# Patient Record
Sex: Female | Born: 1985 | Race: White | Hispanic: No | Marital: Married | Smoking: Former smoker
Health system: Southern US, Community
[De-identification: ages and names within clinical notes are randomized; demographics above are authoritative.]

## PROBLEM LIST (undated history)

## (undated) ENCOUNTER — Inpatient Hospital Stay (HOSPITAL_COMMUNITY): Payer: Self-pay

## (undated) DIAGNOSIS — M069 Rheumatoid arthritis, unspecified: Secondary | ICD-10-CM

## (undated) DIAGNOSIS — Z87891 Personal history of nicotine dependence: Secondary | ICD-10-CM

## (undated) DIAGNOSIS — F32A Depression, unspecified: Secondary | ICD-10-CM

## (undated) DIAGNOSIS — G2581 Restless legs syndrome: Secondary | ICD-10-CM

## (undated) DIAGNOSIS — D649 Anemia, unspecified: Secondary | ICD-10-CM

## (undated) DIAGNOSIS — R87629 Unspecified abnormal cytological findings in specimens from vagina: Secondary | ICD-10-CM

## (undated) DIAGNOSIS — F329 Major depressive disorder, single episode, unspecified: Secondary | ICD-10-CM

## (undated) DIAGNOSIS — G4710 Hypersomnia, unspecified: Secondary | ICD-10-CM

## (undated) DIAGNOSIS — R011 Cardiac murmur, unspecified: Secondary | ICD-10-CM

## (undated) DIAGNOSIS — Z9889 Other specified postprocedural states: Secondary | ICD-10-CM

## (undated) DIAGNOSIS — IMO0002 Reserved for concepts with insufficient information to code with codable children: Secondary | ICD-10-CM

## (undated) DIAGNOSIS — T8859XA Other complications of anesthesia, initial encounter: Secondary | ICD-10-CM

## (undated) DIAGNOSIS — B999 Unspecified infectious disease: Secondary | ICD-10-CM

## (undated) HISTORY — DX: Unspecified infectious disease: B99.9

## (undated) HISTORY — DX: Depression, unspecified: F32.A

## (undated) HISTORY — DX: Anemia, unspecified: D64.9

## (undated) HISTORY — DX: Major depressive disorder, single episode, unspecified: F32.9

## (undated) HISTORY — DX: Reserved for concepts with insufficient information to code with codable children: IMO0002

## (undated) HISTORY — PX: BREAST ENHANCEMENT SURGERY: SHX7

## (undated) HISTORY — DX: Personal history of nicotine dependence: Z87.891

## (undated) HISTORY — DX: Rheumatoid arthritis, unspecified: M06.9

---
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
## 2006-07-25 DIAGNOSIS — IMO0002 Reserved for concepts with insufficient information to code with codable children: Secondary | ICD-10-CM

## 2006-07-25 DIAGNOSIS — R87619 Unspecified abnormal cytological findings in specimens from cervix uteri: Secondary | ICD-10-CM

## 2006-07-25 HISTORY — DX: Unspecified abnormal cytological findings in specimens from cervix uteri: R87.619

## 2006-07-25 HISTORY — DX: Reserved for concepts with insufficient information to code with codable children: IMO0002

## 2007-01-30 ENCOUNTER — Emergency Department (HOSPITAL_COMMUNITY): Admission: EM | Admit: 2007-01-30 | Discharge: 2007-01-30 | Payer: Self-pay | Admitting: Emergency Medicine

## 2010-07-15 ENCOUNTER — Inpatient Hospital Stay (HOSPITAL_COMMUNITY)
Admission: AD | Admit: 2010-07-15 | Discharge: 2010-07-15 | Payer: Self-pay | Source: Home / Self Care | Attending: Obstetrics and Gynecology | Admitting: Obstetrics and Gynecology

## 2010-07-16 ENCOUNTER — Inpatient Hospital Stay (HOSPITAL_COMMUNITY)
Admission: AD | Admit: 2010-07-16 | Discharge: 2010-07-18 | Payer: Self-pay | Source: Home / Self Care | Attending: Obstetrics and Gynecology | Admitting: Obstetrics and Gynecology

## 2010-10-04 LAB — CBC
HCT: 31.9 % — ABNORMAL LOW (ref 36.0–46.0)
HCT: 37.4 % (ref 36.0–46.0)
Hemoglobin: 10.7 g/dL — ABNORMAL LOW (ref 12.0–15.0)
Hemoglobin: 12.9 g/dL (ref 12.0–15.0)
MCH: 30.7 pg (ref 26.0–34.0)
MCH: 31.3 pg (ref 26.0–34.0)
MCHC: 33.5 g/dL (ref 30.0–36.0)
MCHC: 34.5 g/dL (ref 30.0–36.0)
MCV: 90.8 fL (ref 78.0–100.0)
MCV: 91.4 fL (ref 78.0–100.0)
Platelets: 220 10*3/uL (ref 150–400)
Platelets: 270 10*3/uL (ref 150–400)
RBC: 3.49 MIL/uL — ABNORMAL LOW (ref 3.87–5.11)
RBC: 4.12 MIL/uL (ref 3.87–5.11)
RDW: 12.7 % (ref 11.5–15.5)
RDW: 13 % (ref 11.5–15.5)
WBC: 13 10*3/uL — ABNORMAL HIGH (ref 4.0–10.5)
WBC: 13.7 10*3/uL — ABNORMAL HIGH (ref 4.0–10.5)

## 2010-10-04 LAB — RPR: RPR Ser Ql: NONREACTIVE

## 2011-05-10 LAB — URINALYSIS, ROUTINE W REFLEX MICROSCOPIC
Bilirubin Urine: NEGATIVE
Glucose, UA: NEGATIVE
Hgb urine dipstick: NEGATIVE
Ketones, ur: 40 — AB
Nitrite: POSITIVE — AB
Protein, ur: NEGATIVE
Specific Gravity, Urine: 1.02
Urobilinogen, UA: 0.2
pH: 6

## 2011-05-10 LAB — POCT PREGNANCY, URINE: Preg Test, Ur: NEGATIVE

## 2011-05-10 LAB — URINE CULTURE: Colony Count: >=100000

## 2011-08-15 ENCOUNTER — Encounter: Payer: Self-pay | Admitting: Advanced Practice Midwife

## 2011-08-15 DIAGNOSIS — Z1272 Encounter for screening for malignant neoplasm of vagina: Secondary | ICD-10-CM

## 2011-12-26 ENCOUNTER — Ambulatory Visit (INDEPENDENT_AMBULATORY_CARE_PROVIDER_SITE_OTHER): Payer: Medicaid Other | Admitting: Obstetrics and Gynecology

## 2011-12-26 DIAGNOSIS — A630 Anogenital (venereal) warts: Secondary | ICD-10-CM | POA: Insufficient documentation

## 2011-12-26 DIAGNOSIS — Z87891 Personal history of nicotine dependence: Secondary | ICD-10-CM

## 2011-12-26 DIAGNOSIS — Z331 Pregnant state, incidental: Secondary | ICD-10-CM

## 2011-12-26 HISTORY — DX: Personal history of nicotine dependence: Z87.891

## 2011-12-26 LAB — POCT URINALYSIS DIPSTICK
Bilirubin, UA: NEGATIVE
Leukocytes, UA: NEGATIVE
Nitrite, UA: NEGATIVE
Protein, UA: NEGATIVE
pH, UA: 5

## 2011-12-27 LAB — PRENATAL PANEL VII
Antibody Screen: NEGATIVE
Basophils Absolute: 0 10*3/uL (ref 0.0–0.1)
Basophils Relative: 1 % (ref 0–1)
HCT: 38.6 % (ref 36.0–46.0)
HIV: NONREACTIVE
MCHC: 34.7 g/dL (ref 30.0–36.0)
Monocytes Absolute: 0.6 10*3/uL (ref 0.1–1.0)
Neutro Abs: 4.4 10*3/uL (ref 1.7–7.7)
Platelets: 282 10*3/uL (ref 150–400)
RDW: 12.4 % (ref 11.5–15.5)
Rh Type: POSITIVE

## 2011-12-29 LAB — CULTURE, OB URINE

## 2012-01-02 ENCOUNTER — Ambulatory Visit (INDEPENDENT_AMBULATORY_CARE_PROVIDER_SITE_OTHER): Payer: Medicaid Other

## 2012-01-02 VITALS — BP 94/56 | Ht 65.0 in | Wt 107.0 lb

## 2012-01-02 DIAGNOSIS — Z124 Encounter for screening for malignant neoplasm of cervix: Secondary | ICD-10-CM

## 2012-01-02 DIAGNOSIS — Z87898 Personal history of other specified conditions: Secondary | ICD-10-CM | POA: Insufficient documentation

## 2012-01-02 LAB — POCT URINALYSIS DIPSTICK
Bilirubin, UA: NEGATIVE
Ketones, UA: NEGATIVE
Leukocytes, UA: NEGATIVE

## 2012-01-02 NOTE — Progress Notes (Signed)
NOB work-up today . Last pap 07/2010 wnl per pt. Pt stated no issues today .

## 2012-01-31 ENCOUNTER — Ambulatory Visit (INDEPENDENT_AMBULATORY_CARE_PROVIDER_SITE_OTHER): Payer: Medicaid Other

## 2012-01-31 VITALS — BP 96/58 | Wt 105.0 lb

## 2012-01-31 DIAGNOSIS — Z87891 Personal history of nicotine dependence: Secondary | ICD-10-CM

## 2012-01-31 DIAGNOSIS — Z87898 Personal history of other specified conditions: Secondary | ICD-10-CM

## 2012-01-31 DIAGNOSIS — O219 Vomiting of pregnancy, unspecified: Secondary | ICD-10-CM

## 2012-01-31 DIAGNOSIS — A630 Anogenital (venereal) warts: Secondary | ICD-10-CM

## 2012-01-31 DIAGNOSIS — Z331 Pregnant state, incidental: Secondary | ICD-10-CM

## 2012-01-31 DIAGNOSIS — O210 Mild hyperemesis gravidarum: Secondary | ICD-10-CM

## 2012-01-31 DIAGNOSIS — Z3490 Encounter for supervision of normal pregnancy, unspecified, unspecified trimester: Secondary | ICD-10-CM

## 2012-01-31 DIAGNOSIS — Z8742 Personal history of other diseases of the female genital tract: Secondary | ICD-10-CM

## 2012-01-31 MED ORDER — PROMETHAZINE HCL 12.5 MG PO TABS: 25.0000 mg | ORAL_TABLET | Freq: Four times a day (QID) | ORAL | Status: DC | PRN

## 2012-01-31 NOTE — Progress Notes (Signed)
[redacted]w[redacted]d; anatomy u/s in 5 weeks.  Plans reveal party.  Only 2 episodes of vomiting since last visit, but frustrated w/ cont'd nausea and agreeable to try po Phenergan prn.  Rx sent.  Did lose 2 lbs since last visit.  Will CTO closely.  Traveled to [place] since last visit.  Well otherwise.  Pap neg from NOB w/u 01/02/12.

## 2012-01-31 NOTE — Progress Notes (Signed)
C/o nausea requests rx

## 2012-02-02 ENCOUNTER — Telehealth: Payer: Self-pay

## 2012-02-02 ENCOUNTER — Other Ambulatory Visit: Payer: Self-pay | Admitting: Obstetrics and Gynecology

## 2012-02-02 NOTE — Telephone Encounter (Signed)
TC to pharmacy,  Clarified [facility] name.

## 2012-02-02 NOTE — Telephone Encounter (Signed)
Spoke with pt informing her had issues escribing Phenergan to [facility] it printed out instead. Pt reqs Phenergan to be called in [facility] in [place].

## 2012-03-06 ENCOUNTER — Ambulatory Visit (INDEPENDENT_AMBULATORY_CARE_PROVIDER_SITE_OTHER): Payer: Medicaid Other | Admitting: Obstetrics and Gynecology

## 2012-03-06 ENCOUNTER — Ambulatory Visit (INDEPENDENT_AMBULATORY_CARE_PROVIDER_SITE_OTHER): Payer: Medicaid Other

## 2012-03-06 ENCOUNTER — Encounter: Payer: Medicaid Other | Admitting: Obstetrics and Gynecology

## 2012-03-06 ENCOUNTER — Encounter: Payer: Self-pay | Admitting: Obstetrics and Gynecology

## 2012-03-06 VITALS — BP 98/58 | Wt 108.0 lb

## 2012-03-06 DIAGNOSIS — Z3490 Encounter for supervision of normal pregnancy, unspecified, unspecified trimester: Secondary | ICD-10-CM

## 2012-03-06 DIAGNOSIS — Z331 Pregnant state, incidental: Secondary | ICD-10-CM

## 2012-03-06 DIAGNOSIS — Z3689 Encounter for other specified antenatal screening: Secondary | ICD-10-CM

## 2012-03-06 LAB — US OB COMP + 14 WK

## 2012-03-06 NOTE — Progress Notes (Signed)
C/o possible bladder infection L side low back pain - pt will attempt to void for a clean catch urine sample C/o yeast infection pt on day 4 Monistat 7 sx's are decreasing pt states she has recurrent yeast infections Anatomy u/s today Cervix length 4.37 Anterior placenta fluid is normal vertical 4.4 cm Profile. Palate, philtrum, nasal bone, open hands, heel, feet seen, female gender, normal ovaries, no fluid in CDS, normal [place]

## 2012-03-06 NOTE — Progress Notes (Signed)
Patient ID: Robin [name], female   DOB: 1985/08/07, 26 y.o.   MRN: [id number] US WNL anterior placenta, declines genetic screen. UA WNL, discussed yeast, baking soda bathes. Lavera Guise, CNM

## 2012-04-03 ENCOUNTER — Encounter: Payer: Self-pay | Admitting: Obstetrics and Gynecology

## 2012-04-03 ENCOUNTER — Ambulatory Visit (INDEPENDENT_AMBULATORY_CARE_PROVIDER_SITE_OTHER): Payer: Medicaid Other | Admitting: Obstetrics and Gynecology

## 2012-04-03 VITALS — BP 90/56 | Wt 111.0 lb

## 2012-04-03 DIAGNOSIS — G911 Obstructive hydrocephalus: Secondary | ICD-10-CM

## 2012-04-03 DIAGNOSIS — Z8774 Personal history of (corrected) congenital malformations of heart and circulatory system: Secondary | ICD-10-CM

## 2012-04-03 DIAGNOSIS — Z87440 Personal history of urinary (tract) infections: Secondary | ICD-10-CM

## 2012-04-03 DIAGNOSIS — G919 Hydrocephalus, unspecified: Secondary | ICD-10-CM

## 2012-04-03 LAB — POCT URINALYSIS DIPSTICK
Ketones, UA: NEGATIVE
Protein, UA: NEGATIVE
Spec Grav, UA: 1.015
pH, UA: 7

## 2012-04-03 NOTE — Progress Notes (Signed)
No concerns per pt 

## 2012-04-03 NOTE — Progress Notes (Signed)
Doing well. Plans waterbirth, with attendance at November class anticipated. Plans to decline all hospital meds for baby. OK with glucola at [place]--will use jelly beans Discussed GBS testing in 3rd trimester--patient will consider issue and decide if she would agree to testing or decline. Will be working with Verl Dicker as doula (also personal friend).

## 2012-04-30 ENCOUNTER — Other Ambulatory Visit: Payer: Medicaid Other

## 2012-04-30 ENCOUNTER — Ambulatory Visit (INDEPENDENT_AMBULATORY_CARE_PROVIDER_SITE_OTHER): Payer: Medicaid Other | Admitting: Obstetrics and Gynecology

## 2012-04-30 ENCOUNTER — Encounter: Payer: Self-pay | Admitting: Obstetrics and Gynecology

## 2012-04-30 VITALS — BP 100/60 | Wt 116.0 lb

## 2012-04-30 DIAGNOSIS — Z331 Pregnant state, incidental: Secondary | ICD-10-CM

## 2012-04-30 NOTE — Progress Notes (Signed)
Glucola Due @ 12:20

## 2012-04-30 NOTE — Patient Instructions (Signed)
Fetal Movement Counts Patient Name: __________________________________________________ Patient Due Date: ____________________ Kick counts is highly recommended in high risk pregnancies, but it is a good idea for every pregnant woman to do. Start counting fetal movements at 28 weeks of the pregnancy. Fetal movements increase after eating a full meal or eating or drinking something sweet (the blood sugar is higher). It is also important to drink plenty of fluids (well hydrated) before doing the count. Lie on your left side because it helps with the circulation or you can sit in a comfortable chair with your arms over your belly (abdomen) with no distractions around you. DOING THE COUNT  Try to do the count the same time of day each time you do it.  Mark the day and time, then see how long it takes for you to feel 10 movements (kicks, flutters, swishes, rolls). You should have at least 10 movements within 2 hours. You will most likely feel 10 movements in much less than 2 hours. If you do not, wait an hour and count again. After a couple of days you will see a pattern.  What you are looking for is a change in the pattern or not enough counts in 2 hours. Is it taking longer in time to reach 10 movements? SEEK MEDICAL CARE IF:  You feel less than 10 counts in 2 hours. Tried twice.  No movement in one hour.  The pattern is changing or taking longer each day to reach 10 counts in 2 hours.  You feel the baby is not moving as it usually does. Date: ____________ Movements: ____________ Start time: ____________ Finish time: ____________  Date: ____________ Movements: ____________ Start time: ____________ Finish time: ____________ Date: ____________ Movements: ____________ Start time: ____________ Finish time: ____________ Date: ____________ Movements: ____________ Start time: ____________ Finish time: ____________ Date: ____________ Movements: ____________ Start time: ____________ Finish time:  ____________ Date: ____________ Movements: ____________ Start time: ____________ Finish time: ____________ Date: ____________ Movements: ____________ Start time: ____________ Finish time: ____________ Date: ____________ Movements: ____________ Start time: ____________ Finish time: ____________  Date: ____________ Movements: ____________ Start time: ____________ Finish time: ____________ Date: ____________ Movements: ____________ Start time: ____________ Finish time: ____________ Date: ____________ Movements: ____________ Start time: ____________ Finish time: ____________ Date: ____________ Movements: ____________ Start time: ____________ Finish time: ____________ Date: ____________ Movements: ____________ Start time: ____________ Finish time: ____________ Date: ____________ Movements: ____________ Start time: ____________ Finish time: ____________ Date: ____________ Movements: ____________ Start time: ____________ Finish time: ____________  Date: ____________ Movements: ____________ Start time: ____________ Finish time: ____________ Date: ____________ Movements: ____________ Start time: ____________ Finish time: ____________ Date: ____________ Movements: ____________ Start time: ____________ Finish time: ____________ Date: ____________ Movements: ____________ Start time: ____________ Finish time: ____________ Date: ____________ Movements: ____________ Start time: ____________ Finish time: ____________ Date: ____________ Movements: ____________ Start time: ____________ Finish time: ____________ Date: ____________ Movements: ____________ Start time: ____________ Finish time: ____________  Date: ____________ Movements: ____________ Start time: ____________ Finish time: ____________ Date: ____________ Movements: ____________ Start time: ____________ Finish time: ____________ Date: ____________ Movements: ____________ Start time: ____________ Finish time: ____________ Date: ____________ Movements:  ____________ Start time: ____________ Finish time: ____________ Date: ____________ Movements: ____________ Start time: ____________ Finish time: ____________ Date: ____________ Movements: ____________ Start time: ____________ Finish time: ____________ Date: ____________ Movements: ____________ Start time: ____________ Finish time: ____________  Date: ____________ Movements: ____________ Start time: ____________ Finish time: ____________ Date: ____________ Movements: ____________ Start time: ____________ Finish time: ____________ Date: ____________ Movements: ____________ Start time: ____________ Finish time: ____________ Date: ____________ Movements:   ____________ Start time: ____________ Finish time: ____________ Date: ____________ Movements: ____________ Start time: ____________ Finish time: ____________ Date: ____________ Movements: ____________ Start time: ____________ Finish time: ____________ Date: ____________ Movements: ____________ Start time: ____________ Finish time: ____________  Date: ____________ Movements: ____________ Start time: ____________ Finish time: ____________ Date: ____________ Movements: ____________ Start time: ____________ Finish time: ____________ Date: ____________ Movements: ____________ Start time: ____________ Finish time: ____________ Date: ____________ Movements: ____________ Start time: ____________ Finish time: ____________ Date: ____________ Movements: ____________ Start time: ____________ Finish time: ____________ Date: ____________ Movements: ____________ Start time: ____________ Finish time: ____________ Date: ____________ Movements: ____________ Start time: ____________ Finish time: ____________  Date: ____________ Movements: ____________ Start time: ____________ Finish time: ____________ Date: ____________ Movements: ____________ Start time: ____________ Finish time: ____________ Date: ____________ Movements: ____________ Start time: ____________ Finish  time: ____________ Date: ____________ Movements: ____________ Start time: ____________ Finish time: ____________ Date: ____________ Movements: ____________ Start time: ____________ Finish time: ____________ Date: ____________ Movements: ____________ Start time: ____________ Finish time: ____________ Date: ____________ Movements: ____________ Start time: ____________ Finish time: ____________  Date: ____________ Movements: ____________ Start time: ____________ Finish time: ____________ Date: ____________ Movements: ____________ Start time: ____________ Finish time: ____________ Date: ____________ Movements: ____________ Start time: ____________ Finish time: ____________ Date: ____________ Movements: ____________ Start time: ____________ Finish time: ____________ Date: ____________ Movements: ____________ Start time: ____________ Finish time: ____________ Date: ____________ Movements: ____________ Start time: ____________ Finish time: ____________ Document Released: 08/10/2006 Document Revised: 10/03/2011 Document Reviewed: 02/10/2009 Exit[employer] Patient Information 2013 [employer].  

## 2012-04-30 NOTE — Progress Notes (Signed)
A/P Glucola, hemoglobin and RPR today Fetal kick counts reviewed All patients questions answered Return in two weeks Continue Prenatal vitamins Blood type O pos Pt took a glucola today

## 2012-05-01 LAB — GLUCOSE TOLERANCE, 1 HOUR (50G) W/O FASTING: Glucose, 1 Hour GTT: 96 mg/dL (ref 70–140)

## 2012-05-15 ENCOUNTER — Encounter: Payer: Self-pay | Admitting: Obstetrics and Gynecology

## 2012-05-15 ENCOUNTER — Ambulatory Visit (INDEPENDENT_AMBULATORY_CARE_PROVIDER_SITE_OTHER): Payer: Medicaid Other | Admitting: Obstetrics and Gynecology

## 2012-05-15 VITALS — BP 100/58 | Wt 120.0 lb

## 2012-05-15 DIAGNOSIS — Z331 Pregnant state, incidental: Secondary | ICD-10-CM

## 2012-05-15 DIAGNOSIS — Z3490 Encounter for supervision of normal pregnancy, unspecified, unspecified trimester: Secondary | ICD-10-CM

## 2012-05-15 NOTE — Progress Notes (Signed)
[redacted]w[redacted]d 1 gtt 96 Hemoglobin 12.6 RPR NR

## 2012-05-15 NOTE — Progress Notes (Signed)
Doing well.  Working on [employer]. Plans to attend November WB class Plans placental encapsulation. Reviewed normal glucola.

## 2012-05-31 ENCOUNTER — Ambulatory Visit (INDEPENDENT_AMBULATORY_CARE_PROVIDER_SITE_OTHER): Payer: Medicaid Other | Admitting: Obstetrics and Gynecology

## 2012-05-31 VITALS — BP 92/60 | Temp 99.0°F | Wt 122.0 lb

## 2012-05-31 DIAGNOSIS — Z331 Pregnant state, incidental: Secondary | ICD-10-CM

## 2012-05-31 DIAGNOSIS — Z3490 Encounter for supervision of normal pregnancy, unspecified, unspecified trimester: Secondary | ICD-10-CM

## 2012-05-31 NOTE — Progress Notes (Signed)
Doing well. Birth plan reviewed today--plans to decline Vit K, Hep B, and eye ointment. Plans attendance at November WB class (11/20). Copy to scanning. Working with Robin [name] as doula. Sinus/allergy issues are chronic--takes Claritin/Zyrtec intermittently.

## 2012-05-31 NOTE — Progress Notes (Signed)
[redacted]w[redacted]d Pt complains of sinus congestion/seasonal allergies--worse with pregnancy.

## 2012-06-14 ENCOUNTER — Encounter: Payer: Self-pay | Admitting: Obstetrics and Gynecology

## 2012-06-14 ENCOUNTER — Ambulatory Visit (INDEPENDENT_AMBULATORY_CARE_PROVIDER_SITE_OTHER): Payer: Medicaid Other | Admitting: Obstetrics and Gynecology

## 2012-06-14 VITALS — BP 98/58 | Wt 123.0 lb

## 2012-06-14 DIAGNOSIS — Z331 Pregnant state, incidental: Secondary | ICD-10-CM

## 2012-06-14 NOTE — Progress Notes (Signed)
Pt w/o complaint, declines flu shot.  

## 2012-06-14 NOTE — Patient Instructions (Signed)
Fetal Movement Counts Patient Name: __________________________________________________ Patient Due Date: ____________________ Kick counts is highly recommended in high risk pregnancies, but it is a good idea for every pregnant woman to do. Start counting fetal movements at 28 weeks of the pregnancy. Fetal movements increase after eating a full meal or eating or drinking something sweet (the blood sugar is higher). It is also important to drink plenty of fluids (well hydrated) before doing the count. Lie on your left side because it helps with the circulation or you can sit in a comfortable chair with your arms over your belly (abdomen) with no distractions around you. DOING THE COUNT  Try to do the count the same time of day each time you do it.  Mark the day and time, then see how long it takes for you to feel 10 movements (kicks, flutters, swishes, rolls). You should have at least 10 movements within 2 hours. You will most likely feel 10 movements in much less than 2 hours. If you do not, wait an hour and count again. After a couple of days you will see a pattern.  What you are looking for is a change in the pattern or not enough counts in 2 hours. Is it taking longer in time to reach 10 movements? SEEK MEDICAL CARE IF:  You feel less than 10 counts in 2 hours. Tried twice.  No movement in one hour.  The pattern is changing or taking longer each day to reach 10 counts in 2 hours.  You feel the baby is not moving as it usually does. Date: ____________ Movements: ____________ Start time: ____________ Finish time: ____________  Date: ____________ Movements: ____________ Start time: ____________ Finish time: ____________ Date: ____________ Movements: ____________ Start time: ____________ Finish time: ____________ Date: ____________ Movements: ____________ Start time: ____________ Finish time: ____________ Date: ____________ Movements: ____________ Start time: ____________ Finish time:  ____________ Date: ____________ Movements: ____________ Start time: ____________ Finish time: ____________ Date: ____________ Movements: ____________ Start time: ____________ Finish time: ____________ Date: ____________ Movements: ____________ Start time: ____________ Finish time: ____________  Date: ____________ Movements: ____________ Start time: ____________ Finish time: ____________ Date: ____________ Movements: ____________ Start time: ____________ Finish time: ____________ Date: ____________ Movements: ____________ Start time: ____________ Finish time: ____________ Date: ____________ Movements: ____________ Start time: ____________ Finish time: ____________ Date: ____________ Movements: ____________ Start time: ____________ Finish time: ____________ Date: ____________ Movements: ____________ Start time: ____________ Finish time: ____________ Date: ____________ Movements: ____________ Start time: ____________ Finish time: ____________  Date: ____________ Movements: ____________ Start time: ____________ Finish time: ____________ Date: ____________ Movements: ____________ Start time: ____________ Finish time: ____________ Date: ____________ Movements: ____________ Start time: ____________ Finish time: ____________ Date: ____________ Movements: ____________ Start time: ____________ Finish time: ____________ Date: ____________ Movements: ____________ Start time: ____________ Finish time: ____________ Date: ____________ Movements: ____________ Start time: ____________ Finish time: ____________ Date: ____________ Movements: ____________ Start time: ____________ Finish time: ____________  Date: ____________ Movements: ____________ Start time: ____________ Finish time: ____________ Date: ____________ Movements: ____________ Start time: ____________ Finish time: ____________ Date: ____________ Movements: ____________ Start time: ____________ Finish time: ____________ Date: ____________ Movements:  ____________ Start time: ____________ Finish time: ____________ Date: ____________ Movements: ____________ Start time: ____________ Finish time: ____________ Date: ____________ Movements: ____________ Start time: ____________ Finish time: ____________ Date: ____________ Movements: ____________ Start time: ____________ Finish time: ____________  Date: ____________ Movements: ____________ Start time: ____________ Finish time: ____________ Date: ____________ Movements: ____________ Start time: ____________ Finish time: ____________ Date: ____________ Movements: ____________ Start time: ____________ Finish time: ____________ Date: ____________ Movements:   ____________ Start time: ____________ Finish time: ____________ Date: ____________ Movements: ____________ Start time: ____________ Finish time: ____________ Date: ____________ Movements: ____________ Start time: ____________ Finish time: ____________ Date: ____________ Movements: ____________ Start time: ____________ Finish time: ____________  Date: ____________ Movements: ____________ Start time: ____________ Finish time: ____________ Date: ____________ Movements: ____________ Start time: ____________ Finish time: ____________ Date: ____________ Movements: ____________ Start time: ____________ Finish time: ____________ Date: ____________ Movements: ____________ Start time: ____________ Finish time: ____________ Date: ____________ Movements: ____________ Start time: ____________ Finish time: ____________ Date: ____________ Movements: ____________ Start time: ____________ Finish time: ____________ Date: ____________ Movements: ____________ Start time: ____________ Finish time: ____________  Date: ____________ Movements: ____________ Start time: ____________ Finish time: ____________ Date: ____________ Movements: ____________ Start time: ____________ Finish time: ____________ Date: ____________ Movements: ____________ Start time: ____________ Finish  time: ____________ Date: ____________ Movements: ____________ Start time: ____________ Finish time: ____________ Date: ____________ Movements: ____________ Start time: ____________ Finish time: ____________ Date: ____________ Movements: ____________ Start time: ____________ Finish time: ____________ Date: ____________ Movements: ____________ Start time: ____________ Finish time: ____________  Date: ____________ Movements: ____________ Start time: ____________ Finish time: ____________ Date: ____________ Movements: ____________ Start time: ____________ Finish time: ____________ Date: ____________ Movements: ____________ Start time: ____________ Finish time: ____________ Date: ____________ Movements: ____________ Start time: ____________ Finish time: ____________ Date: ____________ Movements: ____________ Start time: ____________ Finish time: ____________ Date: ____________ Movements: ____________ Start time: ____________ Finish time: ____________ Document Released: 08/10/2006 Document Revised: 10/03/2011 Document Reviewed: 02/10/2009 Exit[employer] Patient Information 2013 [employer].  

## 2012-06-14 NOTE — Progress Notes (Signed)
[redacted]w[redacted]d S<D [place] @[place]  Pt with occ nausea.  She uses phenergan as needed

## 2012-06-26 ENCOUNTER — Other Ambulatory Visit: Payer: Self-pay | Admitting: Obstetrics and Gynecology

## 2012-06-26 ENCOUNTER — Ambulatory Visit (INDEPENDENT_AMBULATORY_CARE_PROVIDER_SITE_OTHER): Payer: Medicaid Other

## 2012-06-26 ENCOUNTER — Ambulatory Visit (INDEPENDENT_AMBULATORY_CARE_PROVIDER_SITE_OTHER): Payer: Medicaid Other | Admitting: Obstetrics and Gynecology

## 2012-06-26 VITALS — BP 105/54 | Wt 124.0 lb

## 2012-06-26 DIAGNOSIS — O26849 Uterine size-date discrepancy, unspecified trimester: Secondary | ICD-10-CM

## 2012-06-26 DIAGNOSIS — Q02 Microcephaly: Secondary | ICD-10-CM

## 2012-06-26 DIAGNOSIS — G911 Obstructive hydrocephalus: Secondary | ICD-10-CM

## 2012-06-26 DIAGNOSIS — Z3480 Encounter for supervision of other normal pregnancy, unspecified trimester: Secondary | ICD-10-CM | POA: Insufficient documentation

## 2012-06-26 DIAGNOSIS — Z331 Pregnant state, incidental: Secondary | ICD-10-CM

## 2012-06-26 DIAGNOSIS — G919 Hydrocephalus, unspecified: Secondary | ICD-10-CM

## 2012-06-26 NOTE — Progress Notes (Signed)
Pt stated no issues today.  Ultrasound shows:  SIUP  S=D     [place] EDD: 08/02/12            AFI: 12.93                                 EFW: 4 lb15 oz            Cervical length: 4.46 cm           Placenta localization: anterior           Fetal presentation: vertex Comments: AFI is normal (40th% )  Though BPD is within nl limits, initial HC was less than 2%ile.  Questionably positional measurement was redone with my lifting the vertex out of the pelvis and HC still smaller at 7 %ile/   Will plan MFM consult to r/o microcephaly.

## 2012-06-29 LAB — US OB COMP + 14 WK

## 2012-07-09 ENCOUNTER — Ambulatory Visit (INDEPENDENT_AMBULATORY_CARE_PROVIDER_SITE_OTHER): Payer: Medicaid Other | Admitting: Obstetrics & Gynecology

## 2012-07-09 ENCOUNTER — Encounter: Payer: Self-pay | Admitting: Obstetrics & Gynecology

## 2012-07-09 ENCOUNTER — Telehealth: Payer: Self-pay

## 2012-07-09 ENCOUNTER — Other Ambulatory Visit: Payer: Self-pay | Admitting: Obstetrics and Gynecology

## 2012-07-09 DIAGNOSIS — Z3400 Encounter for supervision of normal first pregnancy, unspecified trimester: Secondary | ICD-10-CM

## 2012-07-09 DIAGNOSIS — Q02 Microcephaly: Secondary | ICD-10-CM

## 2012-07-09 LAB — POCT URINALYSIS DIP (DEVICE)
Hgb urine dipstick: NEGATIVE
Ketones, ur: NEGATIVE mg/dL
Protein, ur: NEGATIVE mg/dL
pH: 6 (ref 5.0–8.0)

## 2012-07-09 NOTE — Telephone Encounter (Signed)
Tc to pt. Appt sched 07/11/12 @ 1:15p with MFM for U/S and visit. Pt agrees.

## 2012-07-10 ENCOUNTER — Encounter: Payer: Medicaid Other | Admitting: Obstetrics and Gynecology

## 2012-07-11 ENCOUNTER — Ambulatory Visit (HOSPITAL_COMMUNITY)
Admission: RE | Admit: 2012-07-11 | Discharge: 2012-07-11 | Disposition: A | Discharge status: Home / Self Care | Payer: Medicaid Other | Source: Ambulatory Visit | Attending: Obstetrics and Gynecology | Admitting: Obstetrics and Gynecology

## 2012-07-11 ENCOUNTER — Other Ambulatory Visit: Payer: Self-pay | Admitting: Obstetrics and Gynecology

## 2012-07-11 ENCOUNTER — Ambulatory Visit (INDEPENDENT_AMBULATORY_CARE_PROVIDER_SITE_OTHER): Payer: Medicaid Other | Admitting: Obstetrics and Gynecology

## 2012-07-11 VITALS — BP 117/74 | HR 98 | Wt 129.0 lb

## 2012-07-11 VITALS — BP 100/70 | Wt 128.0 lb

## 2012-07-11 DIAGNOSIS — Z363 Encounter for antenatal screening for malformations: Secondary | ICD-10-CM | POA: Insufficient documentation

## 2012-07-11 DIAGNOSIS — Z1389 Encounter for screening for other disorder: Secondary | ICD-10-CM | POA: Insufficient documentation

## 2012-07-11 DIAGNOSIS — Q02 Microcephaly: Secondary | ICD-10-CM

## 2012-07-11 DIAGNOSIS — Z331 Pregnant state, incidental: Secondary | ICD-10-CM

## 2012-07-11 DIAGNOSIS — O26849 Uterine size-date discrepancy, unspecified trimester: Secondary | ICD-10-CM | POA: Insufficient documentation

## 2012-07-11 DIAGNOSIS — O358XX0 Maternal care for other (suspected) fetal abnormality and damage, not applicable or unspecified: Secondary | ICD-10-CM | POA: Insufficient documentation

## 2012-07-11 DIAGNOSIS — Z8774 Personal history of (corrected) congenital malformations of heart and circulatory system: Secondary | ICD-10-CM

## 2012-07-11 DIAGNOSIS — G919 Hydrocephalus, unspecified: Secondary | ICD-10-CM

## 2012-07-11 NOTE — Addendum Note (Signed)
Addended by: Tim Lair on: 07/11/2012 12:09 PM   Modules accepted: Orders

## 2012-07-11 NOTE — Progress Notes (Signed)
[redacted]w[redacted]d Beta strep, GC, Chlamydia sent Return to office in 1 week. Maternal fetal medicine ultrasound scheduled today due to small head circumference on last ultrasound. Dr. Stefano Gaul

## 2012-07-11 NOTE — Progress Notes (Signed)
105w6d  GBS TODAY.

## 2012-07-19 ENCOUNTER — Ambulatory Visit (INDEPENDENT_AMBULATORY_CARE_PROVIDER_SITE_OTHER): Payer: Medicaid Other | Admitting: Obstetrics and Gynecology

## 2012-07-19 VITALS — BP 100/62 | Wt 129.0 lb

## 2012-07-19 DIAGNOSIS — Z331 Pregnant state, incidental: Secondary | ICD-10-CM

## 2012-07-19 DIAGNOSIS — Z3490 Encounter for supervision of normal pregnancy, unspecified, unspecified trimester: Secondary | ICD-10-CM

## 2012-07-19 NOTE — Progress Notes (Signed)
[redacted]w[redacted]d  Pt desires cervix check today

## 2012-07-19 NOTE — Progress Notes (Signed)
Doing well, but ready. [place] at [facility] 12/18 for small HC--EFW at 20%ile, AC at 10%, HC not less than 3 SD from mean.  "Doubt microcephaly" per Dr Sherrie George. Had normal AFI at 40%ile, normal dopplers, BPP 8/8. Reviewed + GBS and plan for treatment in labor. Still planning waterbirth. Cervix posterior, 1 cm, 60%, vtx, -1.

## 2012-07-25 NOTE — L&D Delivery Note (Signed)
Delivery Note  Pt arrived to MAU w infant delivered via SVD en route to hospital without assistance,  infant was vigorous and pink w cord intact and placenta undelivered  At 3:39 AM a viable female was delivered via  SVD (Presentation: vertex ).  APGAR: unable to assess , weight: pending  Cord was doubly clamped and cut by FOB Placenta status: spontaneous, intact  ,  Cord:  3VC with the following complications: none .  Cord pH: n/a   Anesthesia:  Local for repair Episiotomy: none Lacerations: 1st degree perineal, superficial skin laceration R labia not repaired  Suture Repair: 3.0 vicryl Est. Blood Loss (mL):  Mom to postpartum.  Baby to nursery-stable. Infant remains skin-skin w pt  Pt plans to BF Pt and infant remained stable in MAU Neonatal called to BS per protocol for routine infant assessment   Ruthie Berch M 08/09/2012, 4:43 AM

## 2012-07-27 NOTE — Progress Notes (Signed)
Erroneous encounter

## 2012-07-31 ENCOUNTER — Ambulatory Visit (INDEPENDENT_AMBULATORY_CARE_PROVIDER_SITE_OTHER): Payer: Medicaid Other | Admitting: Obstetrics and Gynecology

## 2012-07-31 ENCOUNTER — Encounter: Payer: Self-pay | Admitting: Obstetrics and Gynecology

## 2012-07-31 VITALS — BP 100/60 | Wt 133.0 lb

## 2012-07-31 DIAGNOSIS — Z331 Pregnant state, incidental: Secondary | ICD-10-CM

## 2012-07-31 NOTE — Progress Notes (Signed)
[redacted]w[redacted]d Positive beta strep discussed.  Patient told that the best way to get proper antibiotics prior to delivery is to come to the hospital rather than planning to labor at home. Return to office in 1 week. Dr. Stefano Gaul

## 2012-07-31 NOTE — Progress Notes (Signed)
[redacted]w[redacted]d cx check

## 2012-08-07 ENCOUNTER — Telehealth: Payer: Self-pay | Admitting: Obstetrics and Gynecology

## 2012-08-07 NOTE — Telephone Encounter (Signed)
Spoke with pt rgd msg. Pt thinks she has a sinus infection and would like antibiotic. Advised pt that she would need to be evaluated by provider for antibiotic but she can try otc treatment for her symptoms. Offered pt appt this afternoon with ND but states she is unable to come in due to dr appt with her child who is currently being treated for tonsillitis. Pt c/o low grade fever (99.2), sore throat, cough, and post nasal drip. Advised pt to call the office if her fever gets to 100.4 but that she could try warm salt water gargle and chloraceptic for her throat. Also advised pt that she can use plain sudafed or robitussin for cough and to call the office if no relief or if symptoms worsen. Pt voiced understanding, has ROB visit scheduled 08/09/12.

## 2012-08-08 ENCOUNTER — Telehealth: Payer: Self-pay | Admitting: Obstetrics and Gynecology

## 2012-08-08 NOTE — Telephone Encounter (Signed)
Pt called back regarding Triage Message for possible sinus infection. Pt urged to continue to increase fluids and continue OTC and keep appt w/ SR tomorrow.    Pt voiced understanding.    [facility] CMA

## 2012-08-09 ENCOUNTER — Encounter (HOSPITAL_COMMUNITY): Payer: Self-pay | Admitting: *Deleted

## 2012-08-09 ENCOUNTER — Telehealth: Payer: Self-pay | Admitting: Obstetrics and Gynecology

## 2012-08-09 ENCOUNTER — Encounter: Payer: Medicaid Other | Admitting: Obstetrics and Gynecology

## 2012-08-09 ENCOUNTER — Inpatient Hospital Stay (HOSPITAL_COMMUNITY)
Admission: AD | Admit: 2012-08-09 | Discharge: 2012-08-11 | DRG: 776 | Disposition: A | Discharge status: Home / Self Care | Payer: Medicaid Other | Source: Ambulatory Visit | Attending: Obstetrics and Gynecology | Admitting: Obstetrics and Gynecology

## 2012-08-09 DIAGNOSIS — Z331 Pregnant state, incidental: Secondary | ICD-10-CM

## 2012-08-09 DIAGNOSIS — O99893 Other specified diseases and conditions complicating puerperium: Secondary | ICD-10-CM | POA: Diagnosis present

## 2012-08-09 DIAGNOSIS — Z22330 Carrier of Group B streptococcus: Secondary | ICD-10-CM

## 2012-08-09 LAB — CBC
HCT: 37.8 % (ref 36.0–46.0)
MCHC: 34.4 g/dL (ref 30.0–36.0)
MCV: 90 fL (ref 78.0–100.0)
Platelets: 203 10*3/uL (ref 150–400)
RDW: 12.8 % (ref 11.5–15.5)
WBC: 12.1 10*3/uL — ABNORMAL HIGH (ref 4.0–10.5)

## 2012-08-09 MED ORDER — IBUPROFEN 600 MG PO TABS
600.0000 mg | ORAL_TABLET | Freq: Four times a day (QID) | ORAL | Status: DC
  Administered 2012-08-09 – 2012-08-11 (×8): 600 mg via ORAL
  Filled 2012-08-09 (×8): qty 1

## 2012-08-09 MED ORDER — LANOLIN HYDROUS EX OINT: TOPICAL_OINTMENT | CUTANEOUS | Status: DC | PRN

## 2012-08-09 MED ORDER — LACTATED RINGERS IV SOLN: 500.0000 mL | INTRAVENOUS | Status: DC | PRN

## 2012-08-09 MED ORDER — OXYTOCIN 10 UNIT/ML IJ SOLN
INTRAMUSCULAR | Status: AC
  Filled 2012-08-09: qty 1

## 2012-08-09 MED ORDER — WITCH HAZEL-GLYCERIN EX PADS: 1.0000 "application " | MEDICATED_PAD | CUTANEOUS | Status: DC | PRN

## 2012-08-09 MED ORDER — OXYTOCIN 10 UNIT/ML IJ SOLN: 10.0000 [IU] | Freq: Once | INTRAMUSCULAR | Status: DC

## 2012-08-09 MED ORDER — SENNOSIDES-DOCUSATE SODIUM 8.6-50 MG PO TABS
2.0000 | ORAL_TABLET | Freq: Every day | ORAL | Status: DC
  Administered 2012-08-09 – 2012-08-10 (×2): 2 via ORAL

## 2012-08-09 MED ORDER — ZOLPIDEM TARTRATE 5 MG PO TABS: 5.0000 mg | ORAL_TABLET | Freq: Every evening | ORAL | Status: DC | PRN

## 2012-08-09 MED ORDER — DIBUCAINE 1 % RE OINT: 1.0000 "application " | TOPICAL_OINTMENT | RECTAL | Status: DC | PRN

## 2012-08-09 MED ORDER — OXYCODONE-ACETAMINOPHEN 5-325 MG PO TABS
1.0000 | ORAL_TABLET | ORAL | Status: DC | PRN
  Administered 2012-08-09 – 2012-08-11 (×4): 1 via ORAL
  Filled 2012-08-09 (×3): qty 1

## 2012-08-09 MED ORDER — DIPHENHYDRAMINE HCL 25 MG PO CAPS: 25.0000 mg | ORAL_CAPSULE | Freq: Four times a day (QID) | ORAL | Status: DC | PRN

## 2012-08-09 MED ORDER — FLEET ENEMA 7-19 GM/118ML RE ENEM: 1.0000 | ENEMA | Freq: Every day | RECTAL | Status: DC | PRN

## 2012-08-09 MED ORDER — ACETAMINOPHEN 500 MG PO TABS: 1000.0000 mg | ORAL_TABLET | ORAL | Status: DC | PRN

## 2012-08-09 MED ORDER — MEASLES, MUMPS & RUBELLA VAC ~~LOC~~ INJ
0.5000 mL | INJECTION | Freq: Once | SUBCUTANEOUS | Status: DC
  Filled 2012-08-09: qty 0.5

## 2012-08-09 MED ORDER — TETANUS-DIPHTH-ACELL PERTUSSIS 5-2.5-18.5 LF-MCG/0.5 IM SUSP: 0.5000 mL | Freq: Once | INTRAMUSCULAR | Status: DC

## 2012-08-09 MED ORDER — PRENATAL MULTIVITAMIN CH
1.0000 | ORAL_TABLET | Freq: Every day | ORAL | Status: DC
  Administered 2012-08-09 – 2012-08-11 (×3): 1 via ORAL
  Filled 2012-08-09 (×3): qty 1

## 2012-08-09 MED ORDER — OXYCODONE-ACETAMINOPHEN 5-325 MG PO TABS
1.0000 | ORAL_TABLET | ORAL | Status: DC | PRN
  Filled 2012-08-09: qty 1

## 2012-08-09 MED ORDER — OXYTOCIN 40 UNITS IN LACTATED RINGERS INFUSION - SIMPLE MED
62.5000 mL/h | INTRAVENOUS | Status: DC
Start: 2012-08-09 — End: 2012-08-09

## 2012-08-09 MED ORDER — ONDANSETRON HCL 4 MG/2ML IJ SOLN: 4.0000 mg | Freq: Four times a day (QID) | INTRAMUSCULAR | Status: DC | PRN

## 2012-08-09 MED ORDER — IBUPROFEN 600 MG PO TABS
600.0000 mg | ORAL_TABLET | Freq: Four times a day (QID) | ORAL | Status: DC | PRN
  Administered 2012-08-09: 600 mg via ORAL
  Filled 2012-08-09: qty 1

## 2012-08-09 MED ORDER — LIDOCAINE HCL (PF) 1 % IJ SOLN
30.0000 mL | INTRAMUSCULAR | Status: DC | PRN
  Filled 2012-08-09: qty 30

## 2012-08-09 MED ORDER — CITRIC ACID-SODIUM CITRATE 334-500 MG/5ML PO SOLN: 30.0000 mL | ORAL | Status: DC | PRN

## 2012-08-09 MED ORDER — BENZOCAINE-MENTHOL 20-0.5 % EX AERO: 1.0000 "application " | INHALATION_SPRAY | CUTANEOUS | Status: DC | PRN

## 2012-08-09 MED ORDER — LACTATED RINGERS IV SOLN: INTRAVENOUS | Status: DC

## 2012-08-09 MED ORDER — OXYCODONE-ACETAMINOPHEN 5-325 MG PO TABS: 1.0000 | ORAL_TABLET | ORAL | Status: DC | PRN

## 2012-08-09 MED ORDER — LIDOCAINE HCL (PF) 1 % IJ SOLN
INTRAMUSCULAR | Status: AC
  Administered 2012-08-09: 30 mL
  Filled 2012-08-09: qty 30

## 2012-08-09 MED ORDER — BISACODYL 10 MG RE SUPP: 10.0000 mg | Freq: Every day | RECTAL | Status: DC | PRN

## 2012-08-09 MED ORDER — OXYTOCIN BOLUS FROM INFUSION: 500.0000 mL | INTRAVENOUS | Status: DC

## 2012-08-09 MED ORDER — SIMETHICONE 80 MG PO CHEW: 80.0000 mg | CHEWABLE_TABLET | ORAL | Status: DC | PRN

## 2012-08-09 MED ORDER — ONDANSETRON HCL 4 MG/2ML IJ SOLN: 4.0000 mg | INTRAMUSCULAR | Status: DC | PRN

## 2012-08-09 MED ORDER — ONDANSETRON HCL 4 MG PO TABS: 4.0000 mg | ORAL_TABLET | ORAL | Status: DC | PRN

## 2012-08-09 NOTE — MAU Note (Signed)
Almond Lint CNM at bedside upon arrival. Placenta Delivered spontaneously at [phone]. 1st degree perineal laceration repaired with 3.0 vicryl SH. Estimated blood loss 250 ml . Baby ID band number [id number]

## 2012-08-09 NOTE — MAU Note (Signed)
Sanda Klein CNM notified uterus firm with massage. Small amount of bleeding. Will continue to massage.

## 2012-08-09 NOTE — MAU Note (Signed)
Patient states she does not want to Erythromycin or Vitamin K given to newborn.

## 2012-08-09 NOTE — Progress Notes (Signed)
UR completed 

## 2012-08-09 NOTE — MAU Note (Signed)
Sanda Klein CNM notified of increase in bleeding with the last fundal check (moderate amount). Passed two golf size clots and some nickel sizes clots. Takes more massage to firm up uterus.

## 2012-08-09 NOTE — MAU Note (Signed)
Rattray MD from NICU at bedside to assess baby.

## 2012-08-09 NOTE — MAU Note (Signed)
Patient arrived via private vehicle with baby in arms. Delivered in car around 329am

## 2012-08-09 NOTE — H&P (Signed)
Robin [name] is a 27 y.o. female presenting after vaginal delivery en route to hospital.    HPI: Pt began [facility] at 9wks Anatomy scan at 18wks WNL FH S<D at 34wks w f/u US at [redacted]w[redacted]d  results HC 7%, with normal overall growth 20% and normal AFI, MFM consult was obtained and diagnosis of microencephaly determined not likely  GBS at 36wks was pos   Maternal Medical History:  Contractions: Onset was 3-5 hours ago.    Prenatal complications: no prenatal complications   OB History    Grav Para Term Preterm Abortions TAB SAB Ect Mult Living   2 1 1       1      Past Medical History  Diagnosis Date  . Asthma     AS A CHILD;GREW OUT OF @ 8 YOA  . Abnormal Pap smear 2008    HAD HPV;HAD GENITAL WART REMOVED;LAST PAP 07/2010 WAS NORMAL  . Infection     UTI;CAN GET FREQ  . Infection     YEAST INF;NOT FREQ  . Infection     BV;NOT FREQ  . Anemia     FeSO4 SUPP TAKEN IN PAST   Past Surgical History  Procedure Date  . No past surgeries    Family History: family history includes Dementia in her maternal grandmother; Heart attack in her paternal grandfather; and Mitral valve prolapse in her mother and paternal aunt. Social History:  reports that she quit smoking about 13 months ago. Her smoking use included Cigarettes. She has never used smokeless tobacco. She reports that she does not drink alcohol or use illicit drugs.   Prenatal Transfer Tool  Maternal Diabetes: No Genetic Screening: Declined Maternal Ultrasounds/Referrals: Abnormal:  Findings:   Other: at 34wks overall growth 21% but HC 7%  Fetal Ultrasounds or other Referrals:  Referred to Materal Fetal Medicine  f/u US in MFM at 36wks Maternal Substance Abuse:  No Significant Maternal Medications:  None Significant Maternal Lab Results:  Lab values include: Group B Strep positive Other Comments:  None  Review of Systems  All other systems reviewed and are negative.      Blood pressure 109/85, pulse 105, temperature 97 F  (36.1 C), temperature source Oral, resp. rate 18, last menstrual period 10/18/2011, unknown if currently breastfeeding. Exam Physical Exam  Nursing note and vitals reviewed. Constitutional: She is oriented to person, place, and time. She appears well-developed and well-nourished.  HENT:  Head: Normocephalic.  Eyes: Pupils are equal, round, and reactive to light.  Neck: Normal range of motion.  Cardiovascular: Normal rate, regular rhythm and normal heart sounds.   Respiratory: Effort normal and breath sounds normal.  GI: Soft. Bowel sounds are normal.  Genitourinary:        After delivery of placenta, inspection revealed 1st degree perineal laceration and R labial laceration, bleeding was minimal, uterus firm  Musculoskeletal: Normal range of motion.  Neurological: She is alert and oriented to person, place, and time. She has normal reflexes.  Skin: Skin is warm and dry.  Psychiatric: She has a normal mood and affect. Her behavior is normal.    Prenatal labs: ABO, Rh: O/POS/-- (06/03 1049) Antibody: NEG (06/03 1049) Rubella: 43.7 (06/03 1049) RPR: NON REAC (10/07 1123)  HBsAg: NEGATIVE (06/03 1049)  HIV: NON REACTIVE (06/03 1049)  GBS: POSITIVE (12/18 1218)  Pap w cx at NOB neg Repeat cx w GBS neg 1hr gtt WNL   Assessment/Plan: Precipitous vaginal delivery of infant en route to hospital GBS  pos -   Admit to inpatient, tx to [facility] after recovery in MAU Routine postpartum orders   Kodi Steil M 08/09/2012, 4:50 AM

## 2012-08-09 NOTE — Telephone Encounter (Signed)
TC from pt to report she was in active labor and en route to hospital

## 2012-08-10 LAB — CBC
MCH: 30 pg (ref 26.0–34.0)
MCV: 91.3 fL (ref 78.0–100.0)
Platelets: 212 10*3/uL (ref 150–400)
RBC: 3.9 MIL/uL (ref 3.87–5.11)
RDW: 13 % (ref 11.5–15.5)

## 2012-08-10 NOTE — Progress Notes (Signed)
Post Partum Day 1:S/P SVB in car on way to hosptial Subjective: Patient up ad lib, denies syncope or dizziness.  Plans d/c tomorrow per pediatrician preference, due to hx GBS without treatment. Feeding:  Breast Contraceptive plan:   Natural family planning  Objective: Blood pressure 100/63, pulse 67, temperature 97 F (36.1 C), temperature source Oral, resp. rate 18, last menstrual period 10/18/2011, SpO2 97.00%, unknown if currently breastfeeding.  Physical Exam:  General: alert Lochia: appropriate Uterine Fundus: firm Incision: healing well DVT Evaluation: No evidence of DVT seen on physical exam. Negative Homan's sign.   Basename 08/10/12 0350 08/09/12 0437  HGB 11.7* 13.0  HCT 35.6* 37.8    Assessment/Plan: S/P Vaginal delivery day 1 Continue current care Anticipate d/c tomorrow.    LOS: 1 day   Nigel Bridgeman 08/10/2012, 8:43 AM

## 2012-08-10 NOTE — Clinical Social Work Psychosocial (Signed)
    Clinical Social Work Department BRIEF PSYCHOSOCIAL ASSESSMENT 08/10/2012  Patient:  Robin [name], Robin [name]     Account Number:  [id number]     Admit date:  08/09/2012  Clinical Social Worker:  Melene Plan  Date/Time:  08/10/2012 11:56 AM  Referred by:  Physician  Date Referred:  08/10/2012 Referred for  Behavioral Health Issues   Other Referral:   Interview type:  Patient Other interview type:    PSYCHOSOCIAL DATA Living Status:  HUSBAND Admitted from facility:   Level of care:   Primary support name:  Sashia Campas Primary support relationship to patient:  SPOUSE Degree of support available:   Involved    CURRENT CONCERNS Current Concerns  Behavioral Health Issues   Other Concerns:    SOCIAL WORK ASSESSMENT / PLAN CSW referral received to 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  Pt remembers crying a lot and feeling down.  Her symptoms lasted about 6 months before they resolved.  Pt did not seek medical attention or speak to anyone about symptoms, as she was embarrassed.  Pt plans to have her placenta, incapsulated to treat possible PP depression.  She feels comfortable discussing symptoms with her physician, if needed.  Pt's spouse is at the bedside aware of pt's history and supportive.  Pt appears to be bonding well, as CSW observed her nursing. She has all the necessary supplies for the infant and good family support.  CSW encouraged pt to seek medical attention if PP depression symptoms arise.   Assessment/plan status:  No Further Intervention Required Other assessment/ plan:   Information/referral to community resources:   PP depression literature    PATIENT'S/FAMILY'S RESPONSE TO PLAN OF CARE: Pt and spouse thanked CSW for consult.

## 2012-08-10 NOTE — Discharge Summary (Signed)
  Vaginal Delivery Discharge Summary  Robin [name]  DOB:    [birth year] MRN:    [id number] CSN:    [id number]  Date of admission:                  08/09/12  Date of discharge:                   08/11/12  Procedures this admission SVB   Newborn Data:  Live born female  Birth Weight: 7 lb 6.5 oz (3360 g) APGAR: ,   Home with mother.   History of Present Illness:  [name] is a 27 y.o. female, G2P2001, who presents at [redacted]w[redacted]d weeks gestation. The patient has been followed at the [facility] and Gynecology division of [facility]. She was admitted s/p precipitous delivery in car on way to hospital.. Her pregnancy has been complicated by:  Patient Active Problem List  Diagnosis  . Family hx of hydrocephalus  . Family hx of bicuspid aortic valve--FOB  . Normal pregnancy, repeat  . Vaginal delivery     Hospital course:  The patient was admitted after delivering in the car on the way to the hospital.   Her labor was not complicated. She proceeded to have a vaginal delivery of a healthy infant. Her delivery was not complicated, although she did not receive any GBS prophylaxis. Her postpartum course was not complicated. She was discharged to home on postpartum day 2 doing well.  Feeding:  breast  Contraception:  natural family planning (NFP)  Discharge hemoglobin:  Hemoglobin  Date Value Range Status  08/10/2012 11.7* 12.0 - 15.0 g/dL Final     HCT  Date Value Range Status  08/10/2012 35.6* 36.0 - 46.0 % Final    Discharge Physical Exam:   General: alert Lochia: appropriate Uterine Fundus: firm Incision: healing well DVT Evaluation: No evidence of DVT seen on physical exam. Negative Homan's sign.  Intrapartum Procedures: spontaneous vaginal delivery Postpartum Procedures: none Complications-Operative and Postpartum: 1st degree  degree perineal laceration  Discharge Diagnoses: Term Pregnancy-delivered, precipitous  labor and delivery, GBS + without prophylaxis  Discharge Information:  Activity:           Per [facility] handout Diet:                routine Medications: Ibuprofen and Percocet Condition:      stable Instructions:  refer to practice specific booklet Discharge to: home     Nigel Bridgeman 08/10/2012

## 2012-08-11 MED ORDER — IBUPROFEN 600 MG PO TABS: 600.0000 mg | ORAL_TABLET | Freq: Four times a day (QID) | ORAL | Status: DC | PRN

## 2012-08-22 ENCOUNTER — Encounter (HOSPITAL_COMMUNITY): Payer: Self-pay | Admitting: *Deleted

## 2012-09-20 ENCOUNTER — Ambulatory Visit: Payer: Medicaid Other | Admitting: Obstetrics and Gynecology

## 2012-09-20 VITALS — BP 90/66 | Resp 18 | Wt 113.0 lb

## 2012-09-20 DIAGNOSIS — N949 Unspecified condition associated with female genital organs and menstrual cycle: Secondary | ICD-10-CM

## 2012-09-20 LAB — POCT URINALYSIS DIPSTICK
Blood, UA: NEGATIVE
Leukocytes, UA: NEGATIVE
Nitrite, UA: NEGATIVE
Protein, UA: NEGATIVE
Urobilinogen, UA: NEGATIVE
pH, UA: 7

## 2012-09-20 NOTE — Progress Notes (Signed)
Robin [name]  is 6 weeks postpartum following a spontaneous vaginal delivery at 11 gestational weeks Date: 08/09/12 female baby named Annestyne precipitous vaginal delivery en route to hospital.   Breastfeeding: yes Bottlefeeding:  no  Post-partum blues / depression:  no  EPDS score: 5  History of abnormal Pap: Yes 2007 per pt Last Pap: Date 01/05/2012  Gestational diabetes:  no  Contraception:  Desires no method  Normal urinary function:  yes Normal GI function:  No. Pt feels like she's tearing during BM's and painful hemorrhoids. Returning to work:  no

## 2012-09-20 NOTE — Progress Notes (Signed)
Robin [name] is a 27 y.o. female who presents for a postpartum visit.   Type of delivery:  SVB in car on way to hospital--attended by Sanda Klein after arrival at hospital.  Had 1st degree perineal laceration.  Patient reports doing well.  Had posted birth story on Internet on a blog.  Hx remarkable for: Patient Active Problem List  Diagnosis  . Family hx of hydrocephalus  . Family hx of bicuspid aortic valve--FOB  . Normal pregnancy, repeat  . Vaginal delivery     PPDS = 5--denies pp depression  Contraception plan:  Declines to use any contraception.  Breastfeeding   I have fully reviewed the prenatal and intrapartum course   Patient has not been sexually active since delivery.   The following portions of the patient's history were reviewed and updated as appropriate: allergies, current medications, past family history, past medical history, past social history, past surgical history and problem list.  Review of Systems Pertinent items are noted in HPI.   Objective:    BP 90/66  Resp 18  Wt 113 lb (51.256 kg)  BMI 18.8 kg/m2  Breastfeeding? Yes  General:  alert, cooperative and no distress     Lungs: clear to auscultation bilaterally  Heart:  regular rate and rhythm, S1, S2 normal, no murmur  Abdomen: soft, non-tender; bowel sounds normal; no masses,  no organomegaly.   Incision:  NA   Vulva:  normal--well-healed 1st degree laceration  Vagina: normal vagina  Cervix:  normal  Uterus: normal size, contour, position, consistency, mobility, non-tender, well-involuted  Adnexa:  normal adnexa             Assessment:     Normal postpartum exam.  Pap smear:   not done at today's visit.   Due 12/2013.  Plan:  Follow-up in 1 year at annual.  I discussed patient's birth story as posted on the Internet and discussed some comments she made.  I advised her I felt they misrepresented our practice and cast [facility] in a negative light without cause.  The patient stated  she was very pleased with the care she received from [facility] during her pregnancy and at the time of her presentation to the hospital.  I reminded the patient comments that are reported second-hand may not have validity, particularly since the patient was not reporting her own experience, and that posted comments have a ripple effect out into the community.  I advised her the comments did not reflect any information I had ever received about [facility] practices, but if anyone she knew had input on our practice, I would request they be directed to me or [facility] administrators/MDs to discuss the issues.  Patient stated she regretted her posting and was apologetic for any issues it may have caused.   Nigel Bridgeman CNM, [place] 09/20/2012 9:06 PM

## 2013-03-12 NOTE — Progress Notes (Signed)
  Subjective:    Robin [name] is being seen today for her first obstetrical visit.  This is a planned pregnancy. She is at [redacted]w[redacted]d gestation per LMP of 10/18/11. Her obstetrical history is significant for 1. h/o abnl pap 2. h/o genital warts 3. previous smoker 4. childhood asthma 5. h/o frequent UTIs 6. FOB w/ congenital heart defect. Relationship with FOB: spouse, living together. Patient does intend to breast feed. Pregnancy history fully reviewed.  Patient reports nausea and vomiting.  Review of Systems:   Review of Systems  Constitutional: Negative.   HENT: Negative.   Eyes: Negative.   Respiratory: Negative.   Cardiovascular: Negative.   Gastrointestinal: Positive for nausea and vomiting.  Endocrine: Negative.   Genitourinary: Negative.   Musculoskeletal: Negative.   Skin: Negative.   Allergic/Immunologic: Negative.   Neurological: Negative.   Hematological: Negative.   Psychiatric/Behavioral: Negative.     Objective:     BP 94/56  Ht 5\' 5"  (1.651 m)  Wt 107 lb (48.535 kg)  BMI 17.81 kg/m2  LMP 10/18/2011 Physical Exam  Constitutional: She is oriented to person, place, and time. She appears well-developed and well-nourished. No distress.  HENT:  Head: Normocephalic and atraumatic.  Eyes: Pupils are equal, round, and reactive to light.  Cardiovascular: Normal rate and regular rhythm.   Respiratory: Effort normal and breath sounds normal.  GI: Soft. She exhibits no distension and no mass. There is no tenderness. There is no rebound and no guarding.  Genitourinary: Vagina normal.  Cx: closed/long; uterus 9-10 weeks  Musculoskeletal: She exhibits no edema.  Neurological: She is alert and oriented to person, place, and time.  Skin: Skin is warm and dry.  Psychiatric: She has a normal mood and affect. Her behavior is normal. Judgment and thought content normal.    Maternal Exam:  Introitus: Normal vulva. Pelvis: adequate for delivery.   Cervix: Cervix evaluated by  sterile speculum exam and digital exam.     Fetal Exam Fetal Monitor Review: Mode: ultrasound.   Baseline rate: 175.     U/s for viability secondary to no FHT's via doppler: SIUP w/ S<D; AUA=[redacted]w[redacted]d; retroverted uterus; pos YS seen.  Bilateral ovaries and adnexa WNL.  CL on RT OV. cx closed.    Assessment:  1. SIUP w/ S<D on u/s for viability today: [redacted]w[redacted]d (vs [redacted]w[redacted]d); BEST EDC now 08/02/12   Pregnancy: G2P2001 Patient Active Problem List   Diagnosis Date Noted  . Vaginal delivery 08/09/2012  . Normal pregnancy, repeat 06/26/2012  . Family hx of hydrocephalus 04/03/2012  . Family hx of bicuspid aortic valve--FOB 04/03/2012  . History of abnormal Pap smear 01/02/2012  . history of genital warts 12/26/2011  . History of smoking 12/26/2011       Plan:     Initial labs drawn: WNL from 12/26/11 Prenatal vitamins. Problem list reviewed and updated. AFP3 discussed: declined. Role of ultrasound in pregnancy discussed; fetal survey: plan at 19-20 weeks. Amniocentesis discussed: not indicated. Follow up in 4 weeks, or prn. Pap & cx's sent today. C/w MD r/e fetal echo secondary to FOB's history of congenital heart defect. Practice routines rev'd. OTC remedies rev'd for n/v of pregnancy--f/u prn Rx  C. Denny Levy, CNM

## 2013-07-25 DIAGNOSIS — O039 Complete or unspecified spontaneous abortion without complication: Secondary | ICD-10-CM | POA: Insufficient documentation

## 2014-01-31 ENCOUNTER — Ambulatory Visit (INDEPENDENT_AMBULATORY_CARE_PROVIDER_SITE_OTHER): Payer: Managed Care, Other (non HMO) | Admitting: Advanced Practice Midwife

## 2014-01-31 ENCOUNTER — Inpatient Hospital Stay (HOSPITAL_COMMUNITY)
Admission: AD | Admit: 2014-01-31 | Discharge: 2014-01-31 | Disposition: A | Discharge status: Home / Self Care | Payer: Managed Care, Other (non HMO) | Source: Ambulatory Visit | Attending: Obstetrics & Gynecology | Admitting: Obstetrics & Gynecology

## 2014-01-31 ENCOUNTER — Inpatient Hospital Stay (HOSPITAL_COMMUNITY): Payer: Managed Care, Other (non HMO)

## 2014-01-31 ENCOUNTER — Encounter: Payer: Self-pay | Admitting: Advanced Practice Midwife

## 2014-01-31 VITALS — BP 109/75 | HR 108 | Wt 103.0 lb

## 2014-01-31 DIAGNOSIS — O368390 Maternal care for abnormalities of the fetal heart rate or rhythm, unspecified trimester, not applicable or unspecified: Secondary | ICD-10-CM | POA: Insufficient documentation

## 2014-01-31 DIAGNOSIS — O200 Threatened abortion: Secondary | ICD-10-CM

## 2014-01-31 DIAGNOSIS — Z3490 Encounter for supervision of normal pregnancy, unspecified, unspecified trimester: Secondary | ICD-10-CM

## 2014-01-31 DIAGNOSIS — O26849 Uterine size-date discrepancy, unspecified trimester: Secondary | ICD-10-CM

## 2014-01-31 NOTE — Patient Instructions (Signed)

## 2014-01-31 NOTE — MAU Provider Note (Signed)
First Provider Initiated Contact with Patient 01/31/14 1146     [name] is a 28 y.o. G3P2001 at 3233w3d by LMP who presents to MAU today for US after US in the office didn't show cardiac activity. The patient states scant bleeding on Monday and none since. The denies abdominal pain today.   BP 123/73  Pulse 91  Temp(Src) 98.6 F (37 C) (Oral)  Resp 16  LMP 11/26/2013 GENERAL: Well-developed, well-nourished female in no acute distress.  HEENT: Normocephalic, atraumatic.   LUNGS: Effort normal HEART: Regular rate  SKIN: Warm, dry and without erythema PSYCH: Normal mood and affect  [place] Ob Comp Less 14 Wks  01/31/2014   CLINICAL DATA:  Evaluate fetal viability.  EXAM: OBSTETRIC <14 WK US AND TRANSVAGINAL OB US  TECHNIQUE: Both transabdominal and transvaginal ultrasound examinations were performed for complete evaluation of the gestation as well as the maternal uterus, adnexal regions, and pelvic cul-de-sac. Transvaginal technique was performed to assess early pregnancy.  COMPARISON:  07/11/2012.  FINDINGS: Intrauterine gestational sac: Visualized/normal in shape.  Yolk sac:  Visualized.  Embryo:  Visualized.  Cardiac Activity: Not visualized.  CRL:   3.0  mm   6 w 0 d  Maternal uterus/adnexae: Trace free pelvic fluid.  IMPRESSION: 6.0 week intrauterine pregnancy. No fetal cardiac activity noted at this time. Follow-up ultrasound in 10-14 days suggested.   Electronically Signed   By: Maisie Fushomas  Register   On: 01/31/2014 11:39   [place] Ob Transvaginal  01/31/2014   CLINICAL DATA:  Evaluate fetal viability.  EXAM: OBSTETRIC <14 WK US AND TRANSVAGINAL OB US  TECHNIQUE: Both transabdominal and transvaginal ultrasound examinations were performed for complete evaluation of the gestation as well as the maternal uterus, adnexal regions, and pelvic cul-de-sac. Transvaginal technique was performed to assess early pregnancy.  COMPARISON:  07/11/2012.  FINDINGS: Intrauterine gestational sac: Visualized/normal  in shape.  Yolk sac:  Visualized.  Embryo:  Visualized.  Cardiac Activity: Not visualized.  CRL:   3.0  mm   6 w 0 d  Maternal uterus/adnexae: Trace free pelvic fluid.  IMPRESSION: 6.0 week intrauterine pregnancy. No fetal cardiac activity noted at this time. Follow-up ultrasound in 10-14 days suggested.   Electronically Signed   By: Maisie Fushomas  Register   On: 01/31/2014 11:39    A: SIUP at 7549w0d without cardiac activity  P: Discharge home First trimester warning signs discussed Patient scheduled for follow-up US in 1 week. Will come to MAU for results if abnormal Patient may return to MAU as needed or if her condition were to change or worsen  Freddi StarrJulie N Ethier, PA-C  01/31/2014 12:07 PM

## 2014-01-31 NOTE — MAU Provider Note (Signed)

## 2014-01-31 NOTE — MAU Note (Signed)
Pt in radiology waiting lounge

## 2014-01-31 NOTE — Progress Notes (Signed)
New Ob visit. US done prior to my exam. Fetal pole and yolk sac seen in gestational sac. Measured 6.2wks with no heartbeat observed. Fluid collection in Right adnexa. Consulted Dr Macon LargeAnyanwu. Recommended formal US to confirm. Explained to patient probable missed Abortion. Discussed could be early pregnancy but usually expect to see FHR at 6 wks. Her dates are good, monitored closely. [facility] Will go there for US and counseling of results. Return here in 2-4 weeks for exam and PP check. Declines pelvic exam today but states is due for pap and would like to do it at next visit.

## 2014-01-31 NOTE — MAU Note (Addendum)
Sent from office for viability US, will be add on; unable to get scheduled time today. Nauseous.  'just feel pregnan'. No pain or bleeding.

## 2014-01-31 NOTE — Discharge Instructions (Signed)

## 2014-02-03 ENCOUNTER — Other Ambulatory Visit: Payer: Self-pay

## 2014-02-03 ENCOUNTER — Other Ambulatory Visit: Payer: Self-pay | Admitting: Obstetrics and Gynecology

## 2014-02-07 ENCOUNTER — Inpatient Hospital Stay (HOSPITAL_COMMUNITY)
Admission: AD | Admit: 2014-02-07 | Discharge: 2014-02-07 | Disposition: A | Discharge status: Home / Self Care | Payer: Managed Care, Other (non HMO) | Source: Ambulatory Visit | Attending: Obstetrics and Gynecology | Admitting: Obstetrics and Gynecology

## 2014-02-07 ENCOUNTER — Ambulatory Visit (HOSPITAL_COMMUNITY)
Admit: 2014-02-07 | Discharge: 2014-02-07 | Disposition: A | Discharge status: Home / Self Care | Payer: Managed Care, Other (non HMO) | Attending: Medical | Admitting: Medical

## 2014-02-07 DIAGNOSIS — N831 Corpus luteum cyst of ovary, unspecified side: Secondary | ICD-10-CM | POA: Insufficient documentation

## 2014-02-07 DIAGNOSIS — Z3490 Encounter for supervision of normal pregnancy, unspecified, unspecified trimester: Secondary | ICD-10-CM

## 2014-02-07 DIAGNOSIS — O3680X0 Pregnancy with inconclusive fetal viability, not applicable or unspecified: Secondary | ICD-10-CM | POA: Insufficient documentation

## 2014-02-07 DIAGNOSIS — O34599 Maternal care for other abnormalities of gravid uterus, unspecified trimester: Secondary | ICD-10-CM | POA: Insufficient documentation

## 2014-02-07 NOTE — MAU Provider Note (Signed)
US reviewed and viability is still in question.  Pt and husband aware of uncertainty of this pregnancy.  Will consult with Dr Estanislado Pandyivard and call pt back later

## 2014-02-07 NOTE — MAU Provider Note (Signed)
Consultation with Dr Estanislado Pandyivard.  [place] report from 7/10 and from today 7/17 states NO FHR was detected.  Per Dr Estanislado Pandyivard two US with no FHR is confirmation of a non viable pregnancy.   Pt and her husband were called and informed of the findings.  They were informed of their choices 1) expectant management, active management with cytotec or D&C.  Pt and her husband have elected to have expectant management.   Bleeding precaution given.  Pt told to FU in the office in 1 week.

## 2014-02-07 NOTE — MAU Note (Signed)
Had prior discussion with midwives from [facility], pt was not to follow up here.  Called CNM that she had been brought  From US,   They will call with her with results and plan

## 2014-05-26 ENCOUNTER — Encounter: Payer: Self-pay | Admitting: Advanced Practice Midwife

## 2014-07-25 HISTORY — PX: NASAL SINUS SURGERY: SHX719

## 2014-12-06 ENCOUNTER — Encounter (HOSPITAL_COMMUNITY): Payer: Self-pay | Admitting: *Deleted

## 2015-05-25 ENCOUNTER — Ambulatory Visit (INDEPENDENT_AMBULATORY_CARE_PROVIDER_SITE_OTHER): Payer: Managed Care, Other (non HMO) | Admitting: Family

## 2015-05-25 ENCOUNTER — Encounter: Payer: Self-pay | Admitting: Family

## 2015-05-25 VITALS — BP 117/73 | HR 85 | Wt 107.0 lb

## 2015-05-25 DIAGNOSIS — Z124 Encounter for screening for malignant neoplasm of cervix: Secondary | ICD-10-CM

## 2015-05-25 DIAGNOSIS — Z113 Encounter for screening for infections with a predominantly sexual mode of transmission: Secondary | ICD-10-CM | POA: Diagnosis not present

## 2015-05-25 DIAGNOSIS — Z3480 Encounter for supervision of other normal pregnancy, unspecified trimester: Secondary | ICD-10-CM | POA: Insufficient documentation

## 2015-05-25 DIAGNOSIS — Z3481 Encounter for supervision of other normal pregnancy, first trimester: Secondary | ICD-10-CM | POA: Diagnosis not present

## 2015-05-25 DIAGNOSIS — Z3492 Encounter for supervision of normal pregnancy, unspecified, second trimester: Secondary | ICD-10-CM

## 2015-05-25 LAB — HIV ANTIBODY (ROUTINE TESTING W REFLEX): HIV 1&2 Ab, 4th Generation: NONREACTIVE

## 2015-05-25 MED ORDER — DOXYLAMINE-PYRIDOXINE 10-10 MG PO TBEC: DELAYED_RELEASE_TABLET | ORAL | Status: DC

## 2015-05-25 NOTE — Progress Notes (Signed)
Last pap 2 years ago and h/o abn. Bedside U/S shows IUP with FHt of 150BPM and HC is 77.343mm  GA is 359w2d

## 2015-05-25 NOTE — Progress Notes (Signed)
Subjective:    Robin [name] is a [id number] [redacted]w[redacted]d being seen today for her first obstetrical visit.  Her obstetrical history consists of history of two term vaginal deliveries without complication.  Delivered last en route to hospital.  Patient does intend to breast feed. Currently breastfeeding 29 yo once a day.  Pregnancy history fully reviewed.  Patient reports nausea and vomiting.  Filed Vitals:   05/25/15 0931  BP: 117/73  Pulse: 85  Weight: 107 lb (48.535 kg)    HISTORY: OB History  Gravida Para Term Preterm AB SAB TAB Ectopic Multiple Living  # Outcome Date GA Lbr Len/2nd Weight Sex Delivery Anes PTL Lv  4 Current           3 Term 08/09/12 [redacted]w[redacted]d  7 lb 6.5 oz (3.36 kg) F Vag-Spont        Comments: BORN EN ROUTE TO [facility]  2 Term 07/16/10 [redacted]w[redacted]d 12:00 7 lb 7 oz (3.374 kg) M Vag-Spont EPI Y Y     Comments: No complications  1 Gravida              Past Medical History  Diagnosis Date  . Asthma     AS A CHILD;GREW OUT OF @ 8 YOA  . Abnormal Pap smear 2008    HAD HPV;HAD GENITAL WART REMOVED;LAST PAP 07/2010 WAS NORMAL  . Infection     UTI;CAN GET FREQ  . Infection     YEAST INF;NOT FREQ  . Infection     BV;NOT FREQ  . Anemia     FeSO4 SUPP TAKEN IN PAST  . History of smoking 12/26/2011    Reports quit '12  . Depression    Past Surgical History  Procedure Laterality Date  . Nasal sinus surgery  2016   Family History  Problem Relation Age of Onset  . Mitral valve prolapse Mother     DIED @ 75 YOA OF CONDITION  . Mitral valve prolapse Paternal Aunt   . Heart attack Paternal Grandfather     DECEASED  . Dementia Maternal Grandmother      Exam    BP 117/73 mmHg  Pulse 85  Wt 107 lb (48.535 kg)  LMP 02/23/2015 (Exact Date) Uterine Size: size equals dates  Pelvic Exam:    Perineum: No Hemorrhoids, Normal Perineum   Vulva: normal   Vagina:  normal mucosa, normal discharge, no palpable nodules   pH: Not done   Cervix: no bleeding following  Pap, no cervical motion tenderness and no lesions   Adnexa: normal adnexa and no mass, fullness, tenderness   Bony Pelvis: Adequate  System: Breast:  No nipple retraction or dimpling, No nipple discharge or bleeding, No axillary or supraclavicular adenopathy, Normal to palpation without dominant masses   Skin: normal coloration and turgor, no rashes    Neurologic: negative   Extremities: normal strength, tone, and muscle mass   HEENT neck supple with midline trachea and thyroid without masses   Mouth/Teeth mucous membranes moist, pharynx normal without lesions   Neck supple and no masses   Cardiovascular: regular rate and rhythm, no murmurs or gallops   Respiratory:  appears well, vitals normal, no respiratory distress, acyanotic, normal RR, neck free of mass or lymphadenopathy, chest clear, no wheezing, crepitations, rhonchi, normal symmetric air entry   Abdomen: soft, non-tender; bowel sounds normal; no masses,  no organomegaly   Urinary: urethral meatus normal  Assessment:    Pregnancy: G4P2001 Patient Active Problem List   Diagnosis Date Noted  . Supervision of normal pregnancy 05/25/2015  . Vaginal delivery 08/09/2012  . Normal pregnancy, repeat 06/26/2012  . Family hx of hydrocephalus 04/03/2012  . Family hx of bicuspid aortic valve--FOB 04/03/2012  . History of abnormal Pap smear 01/02/2012  . history of genital warts 12/26/2011  . History of smoking 12/26/2011     2. Nausea and Vomiting in Pregnancy - RX Diclegis    Plan:     Initial labs drawn. Prenatal vitamins. Problem list reviewed and updated. Genetic Screening discussed First Screen: declined.  Ultrasound discussed; fetal survey: requested.  Schedule at 19 wks at next visit.    Follow up in 4 weeks.   Marlis EdelsonKARIM, WALIDAH N 05/25/2015

## 2015-05-26 LAB — OBSTETRIC PANEL
Antibody Screen: NEGATIVE
Basophils Absolute: 0 10*3/uL (ref 0.0–0.1)
Basophils Relative: 0 % (ref 0–1)
EOS ABS: 0.3 10*3/uL (ref 0.0–0.7)
Eosinophils Relative: 3 % (ref 0–5)
HCT: 38.7 % (ref 36.0–46.0)
HEMOGLOBIN: 13.6 g/dL (ref 12.0–15.0)
Hepatitis B Surface Ag: NEGATIVE
LYMPHS ABS: 1.6 10*3/uL (ref 0.7–4.0)
Lymphocytes Relative: 17 % (ref 12–46)
MCH: 31.8 pg (ref 26.0–34.0)
MCHC: 35.1 g/dL (ref 30.0–36.0)
MCV: 90.4 fL (ref 78.0–100.0)
MONOS PCT: 6 % (ref 3–12)
MPV: 9.3 fL (ref 8.6–12.4)
Monocytes Absolute: 0.6 10*3/uL (ref 0.1–1.0)
NEUTROS PCT: 74 % (ref 43–77)
Neutro Abs: 6.9 10*3/uL (ref 1.7–7.7)
Platelets: 260 10*3/uL (ref 150–400)
RBC: 4.28 MIL/uL (ref 3.87–5.11)
RDW: 13.1 % (ref 11.5–15.5)
RUBELLA: 1.94 {index} — AB (ref <0.90)
Rh Type: POSITIVE
WBC: 9.3 10*3/uL (ref 4.0–10.5)

## 2015-05-27 LAB — CYTOLOGY - PAP

## 2015-05-28 ENCOUNTER — Encounter: Payer: Self-pay | Admitting: *Deleted

## 2015-05-28 LAB — CULTURE, URINE COMPREHENSIVE

## 2015-05-31 ENCOUNTER — Other Ambulatory Visit: Payer: Self-pay | Admitting: Family

## 2015-05-31 DIAGNOSIS — O2341 Unspecified infection of urinary tract in pregnancy, first trimester: Secondary | ICD-10-CM

## 2015-05-31 DIAGNOSIS — O2340 Unspecified infection of urinary tract in pregnancy, unspecified trimester: Secondary | ICD-10-CM | POA: Insufficient documentation

## 2015-05-31 MED ORDER — CEPHALEXIN 500 MG PO CAPS: 500.0000 mg | ORAL_CAPSULE | Freq: Two times a day (BID) | ORAL | Status: DC

## 2015-06-01 ENCOUNTER — Telehealth: Payer: Self-pay | Admitting: *Deleted

## 2015-06-01 NOTE — Telephone Encounter (Signed)
Pt's husband notified of urine culture and RX was [facility] per Rochele PagesWalidah Karim, CNM

## 2015-06-22 ENCOUNTER — Encounter: Payer: Self-pay | Admitting: Advanced Practice Midwife

## 2015-06-22 ENCOUNTER — Ambulatory Visit (INDEPENDENT_AMBULATORY_CARE_PROVIDER_SITE_OTHER): Payer: Managed Care, Other (non HMO) | Admitting: Advanced Practice Midwife

## 2015-06-22 VITALS — BP 104/67 | HR 103 | Wt 107.0 lb

## 2015-06-22 DIAGNOSIS — R300 Dysuria: Secondary | ICD-10-CM

## 2015-06-22 DIAGNOSIS — Z3482 Encounter for supervision of other normal pregnancy, second trimester: Secondary | ICD-10-CM

## 2015-06-22 DIAGNOSIS — O26892 Other specified pregnancy related conditions, second trimester: Secondary | ICD-10-CM

## 2015-06-22 DIAGNOSIS — Z3492 Encounter for supervision of normal pregnancy, unspecified, second trimester: Secondary | ICD-10-CM

## 2015-06-22 LAB — POCT URINALYSIS DIPSTICK
Bilirubin, UA: NEGATIVE
Blood, UA: NEGATIVE
Glucose, UA: NEGATIVE
KETONES UA: NEGATIVE
Nitrite, UA: NEGATIVE
PH UA: 7
PROTEIN UA: NEGATIVE
Spec Grav, UA: 1.015
Urobilinogen, UA: 0.2

## 2015-06-22 NOTE — Assessment & Plan Note (Signed)
BABYSCRIPTS PATIENT: [ ]  initial, Arly.Keller[X ] 12, [ ]  20, [ ]  28, [ ]  32, [ ]  36, [ ]  38, [ ]  39, [ ]  40

## 2015-06-22 NOTE — Progress Notes (Signed)
Subjective:  Robin [name] is a 29 y.o. [phone] at 1533w0d being seen today for ongoing prenatal care.  She is currently monitored for the following issues for this low-risk pregnancy and has Family hx of hydrocephalus; Family hx of bicuspid aortic valve--FOB; Normal pregnancy, repeat; Vaginal delivery; History of abnormal Pap smear; history of genital warts; History of smoking; Supervision of normal pregnancy; and UTI in pregnancy, antepartum on her problem list.  Patient reports dysuria. No other urinary complaints of flank pain. Finished Keflex for UTI. .  Contractions: Not present. Vag. Bleeding: None.  Movement: Present. Denies leaking of fluid.   The following portions of the patient's history were reviewed and updated as appropriate: allergies, current medications, past family history, past medical history, past social history, past surgical history and problem list. Problem list updated.  Objective:   Filed Vitals:   06/22/15 1011  BP: 104/67  Pulse: 103  Weight: 107 lb (48.535 kg)    Fetal Status: Fetal Heart Rate (bpm): 148   Movement: Present     General:  Alert, oriented and cooperative. Patient is in no acute distress.  Skin: Skin is warm and dry. No rash noted.   Cardiovascular: Normal heart rate noted  Respiratory: Normal respiratory effort, no problems with respiration noted  Abdomen: Soft, gravid, appropriate for gestational age. Pain/Pressure: Absent     Pelvic: Vag. Bleeding: None Vag D/C Character: Thin   Cervical exam deferred        Extremities: Normal range of motion.  Edema: None  Mental Status: Normal mood and affect. Normal behavior. Normal judgment and thought content.   Urinalysis: Urine Protein: Negative Urine Glucose: Negative  Assessment and Plan:  Pregnancy: [id number] at 1733w0d  1. Supervision of normal pregnancy, second trimester    - POCT Urinalysis Dipstick - US MFM OB COMP + 14 WK; Future  2. Dysuria during pregnancy in second trimester,  antepartum  - Culture, OB Urine  Preterm labor symptoms and general obstetric precautions including but not limited to vaginal bleeding, contractions, leaking of fluid and fetal movement were reviewed in detail with the patient. Please refer to After Visit Summary for other counseling recommendations.  Declined quad Anatomy scan ordered Return in about 3 weeks (around 07/13/2015) for Return OB.   Dorathy KinsmanVirginia Berdell Hostetler, CNM

## 2015-06-22 NOTE — Patient Instructions (Signed)

## 2015-06-22 NOTE — Progress Notes (Signed)
Pt thinks she still has UTI after finishing Keflex - Urine dip shows Mod Leuks - will send for culture

## 2015-06-24 LAB — CULTURE, OB URINE: Colony Count: 5000

## 2015-07-01 ENCOUNTER — Encounter: Payer: Self-pay | Admitting: *Deleted

## 2015-07-10 ENCOUNTER — Ambulatory Visit (HOSPITAL_COMMUNITY)
Admission: RE | Admit: 2015-07-10 | Discharge: 2015-07-10 | Disposition: A | Discharge status: Home / Self Care | Payer: Managed Care, Other (non HMO) | Source: Ambulatory Visit | Attending: Advanced Practice Midwife | Admitting: Advanced Practice Midwife

## 2015-07-10 DIAGNOSIS — Z36 Encounter for antenatal screening of mother: Secondary | ICD-10-CM | POA: Insufficient documentation

## 2015-07-10 DIAGNOSIS — Z3A19 19 weeks gestation of pregnancy: Secondary | ICD-10-CM | POA: Insufficient documentation

## 2015-07-10 DIAGNOSIS — Z3492 Encounter for supervision of normal pregnancy, unspecified, second trimester: Secondary | ICD-10-CM

## 2015-07-13 ENCOUNTER — Ambulatory Visit (INDEPENDENT_AMBULATORY_CARE_PROVIDER_SITE_OTHER): Payer: Managed Care, Other (non HMO) | Admitting: Obstetrics & Gynecology

## 2015-07-13 ENCOUNTER — Encounter: Payer: Managed Care, Other (non HMO) | Admitting: Advanced Practice Midwife

## 2015-07-13 ENCOUNTER — Encounter: Payer: Self-pay | Admitting: Obstetrics & Gynecology

## 2015-07-13 VITALS — BP 96/50 | HR 97 | Wt 110.0 lb

## 2015-07-13 DIAGNOSIS — Z3482 Encounter for supervision of other normal pregnancy, second trimester: Secondary | ICD-10-CM

## 2015-07-13 DIAGNOSIS — Z3492 Encounter for supervision of normal pregnancy, unspecified, second trimester: Secondary | ICD-10-CM

## 2015-07-13 NOTE — Progress Notes (Signed)
Subjective:  Robin [name] is a 29 y.o. [phone] at [redacted]w[redacted]d being seen today for ongoing prenatal care.  She is currently monitored for the following issues for this low-risk pregnancy and has Family hx of hydrocephalus; Family hx of bicuspid aortic valve--FOB; Normal pregnancy, repeat; Vaginal delivery; History of abnormal Pap smear; history of genital warts; History of smoking; Supervision of normal pregnancy; and UTI in pregnancy, antepartum on her problem list.  Patient reports no complaints.  Contractions: Not present. Vag. Bleeding: None.  Movement: Present. Denies leaking of fluid.   The following portions of the patient's history were reviewed and updated as appropriate: allergies, current medications, past family history, past medical history, past social history, past surgical history and problem list. Problem list updated.  Objective:   Filed Vitals:   07/13/15 1406  BP: 96/50  Pulse: 97  Weight: 110 lb (49.896 kg)    Fetal Status: Fetal Heart Rate (bpm): 144   Movement: Present     General:  Alert, oriented and cooperative. Patient is in no acute distress.  Skin: Skin is warm and dry. No rash noted.   Cardiovascular: Normal heart rate noted  Respiratory: Normal respiratory effort, no problems with respiration noted  Abdomen: Soft, gravid, appropriate for gestational age. Pain/Pressure: Absent     Pelvic: Vag. Bleeding: None Vag D/C Character: Thin   Cervical exam deferred        Extremities: Normal range of motion.  Edema: None  Mental Status: Normal mood and affect. Normal behavior. Normal judgment and thought content.   Urinalysis: Urine Protein: Negative Urine Glucose: Negative  Assessment and Plan:  Pregnancy: [id number] at [redacted]w[redacted]d  1. Supervision of normal pregnancy, second trimester -enjoying baby scripts -jelly bean challenge next visit -planned home birth -nml anatomy exam  Preterm labor symptoms and general obstetric precautions including but not limited to  vaginal bleeding, contractions, leaking of fluid and fetal movement were reviewed in detail with the patient. Please refer to After Visit Summary for other counseling recommendations.  Return in about 8 weeks (around 09/07/2015).   Lesly DukesKelly H Konstantine Gervasi, MD

## 2015-07-27 DIAGNOSIS — Z8249 Family history of ischemic heart disease and other diseases of the circulatory system: Secondary | ICD-10-CM | POA: Insufficient documentation

## 2015-07-27 DIAGNOSIS — Z8279 Family history of other congenital malformations, deformations and chromosomal abnormalities: Secondary | ICD-10-CM | POA: Insufficient documentation

## 2015-09-07 ENCOUNTER — Ambulatory Visit (INDEPENDENT_AMBULATORY_CARE_PROVIDER_SITE_OTHER): Payer: Managed Care, Other (non HMO) | Admitting: Advanced Practice Midwife

## 2015-09-07 VITALS — BP 110/60 | HR 75 | Wt 119.0 lb

## 2015-09-07 DIAGNOSIS — Z3482 Encounter for supervision of other normal pregnancy, second trimester: Secondary | ICD-10-CM | POA: Diagnosis not present

## 2015-09-07 DIAGNOSIS — Z36 Encounter for antenatal screening of mother: Secondary | ICD-10-CM | POA: Diagnosis not present

## 2015-09-07 DIAGNOSIS — Z3492 Encounter for supervision of normal pregnancy, unspecified, second trimester: Secondary | ICD-10-CM

## 2015-09-07 NOTE — Progress Notes (Signed)
28 wk labs today 

## 2015-09-07 NOTE — Patient Instructions (Signed)
Third Trimester of Pregnancy The third trimester is from week 29 through week 42, months 7 through 9. The third trimester is a time when the fetus is growing rapidly. At the end of the ninth month, the fetus is about 20 inches in length and weighs 6-10 pounds.  BODY CHANGES Your body goes through many changes during pregnancy. The changes vary from woman to woman.   Your weight will continue to increase. You can expect to gain 25-35 pounds (11-16 kg) by the end of the pregnancy.  You may begin to get stretch marks on your hips, abdomen, and breasts.  You may urinate more often because the fetus is moving lower into your pelvis and pressing on your bladder.  You may develop or continue to have heartburn as a result of your pregnancy.  You may develop constipation because certain hormones are causing the muscles that push waste through your intestines to slow down.  You may develop hemorrhoids or swollen, bulging veins (varicose veins).  You may have pelvic pain because of the weight gain and pregnancy hormones relaxing your joints between the bones in your pelvis. Backaches may result from overexertion of the muscles supporting your posture.  You may have changes in your hair. These can include thickening of your hair, rapid growth, and changes in texture. Some women also have hair loss during or after pregnancy, or hair that feels dry or thin. Your hair will most likely return to normal after your baby is born.  Your breasts will continue to grow and be tender. A yellow discharge may leak from your breasts called colostrum.  Your belly button may stick out.  You may feel short of breath because of your expanding uterus.  You may notice the fetus "dropping," or moving lower in your abdomen.  You may have a bloody mucus discharge. This usually occurs a few days to a week before labor begins.  Your cervix becomes thin and soft (effaced) near your due date. WHAT TO EXPECT AT YOUR PRENATAL  EXAMS  You will have prenatal exams every 2 weeks until week 36. Then, you will have weekly prenatal exams. During a routine prenatal visit:  You will be weighed to make sure you and the fetus are growing normally.  Your blood pressure is taken.  Your abdomen will be measured to track your baby's growth.  The fetal heartbeat will be listened to.  Any test results from the previous visit will be discussed.  You may have a cervical check near your due date to see if you have effaced. At around 36 weeks, your caregiver will check your cervix. At the same time, your caregiver will also perform a test on the secretions of the vaginal tissue. This test is to determine if a type of bacteria, Group B streptococcus, is present. Your caregiver will explain this further. Your caregiver may ask you:  What your birth plan is.  How you are feeling.  If you are feeling the baby move.  If you have had any abnormal symptoms, such as leaking fluid, bleeding, severe headaches, or abdominal cramping.  If you are using any tobacco products, including cigarettes, chewing tobacco, and electronic cigarettes.  If you have any questions. Other tests or screenings that may be performed during your third trimester include:  Blood tests that check for low iron levels (anemia).  Fetal testing to check the health, activity level, and growth of the fetus. Testing is done if you have certain medical conditions or if   there are problems during the pregnancy.  HIV (human immunodeficiency virus) testing. If you are at high risk, you may be screened for HIV during your third trimester of pregnancy. FALSE LABOR You may feel small, irregular contractions that eventually go away. These are called Braxton Hicks contractions, or false labor. Contractions may last for hours, days, or even weeks before true labor sets in. If contractions come at regular intervals, intensify, or become painful, it is best to be seen by your  caregiver.  SIGNS OF LABOR   Menstrual-like cramps.  Contractions that are 5 minutes apart or less.  Contractions that start on the top of the uterus and spread down to the lower abdomen and back.  A sense of increased pelvic pressure or back pain.  A watery or bloody mucus discharge that comes from the vagina. If you have any of these signs before the 37th week of pregnancy, call your caregiver right away. You need to go to the hospital to get checked immediately. HOME CARE INSTRUCTIONS   Avoid all smoking, herbs, alcohol, and unprescribed drugs. These chemicals affect the formation and growth of the baby.  Do not use any tobacco products, including cigarettes, chewing tobacco, and electronic cigarettes. If you need help quitting, ask your health care provider. You may receive counseling support and other resources to help you quit.  Follow your caregiver's instructions regarding medicine use. There are medicines that are either safe or unsafe to take during pregnancy.  Exercise only as directed by your caregiver. Experiencing uterine cramps is a good sign to stop exercising.  Continue to eat regular, healthy meals.  Wear a good support bra for breast tenderness.  Do not use hot tubs, steam rooms, or saunas.  Wear your seat belt at all times when driving.  Avoid raw meat, uncooked cheese, cat litter boxes, and soil used by cats. These carry germs that can cause birth defects in the baby.  Take your prenatal vitamins.  Take 1500-2000 mg of calcium daily starting at the 20th week of pregnancy until you deliver your baby.  Try taking a stool softener (if your caregiver approves) if you develop constipation. Eat more high-fiber foods, such as fresh vegetables or fruit and whole grains. Drink plenty of fluids to keep your urine clear or pale yellow.  Take warm sitz baths to soothe any pain or discomfort caused by hemorrhoids. Use hemorrhoid cream if your caregiver approves.  If  you develop varicose veins, wear support hose. Elevate your feet for 15 minutes, 3-4 times a day. Limit salt in your diet.  Avoid heavy lifting, wear low heal shoes, and practice good posture.  Rest a lot with your legs elevated if you have leg cramps or low back pain.  Visit your dentist if you have not gone during your pregnancy. Use a soft toothbrush to brush your teeth and be gentle when you floss.  A sexual relationship may be continued unless your caregiver directs you otherwise.  Do not travel far distances unless it is absolutely necessary and only with the approval of your caregiver.  Take prenatal classes to understand, practice, and ask questions about the labor and delivery.  Make a trial run to the hospital.  Pack your hospital bag.  Prepare the baby's nursery.  Continue to go to all your prenatal visits as directed by your caregiver. SEEK MEDICAL CARE IF:  You are unsure if you are in labor or if your water has broken.  You have dizziness.  You have   mild pelvic cramps, pelvic pressure, or nagging pain in your abdominal area.  You have persistent nausea, vomiting, or diarrhea.  You have a bad smelling vaginal discharge.  You have pain with urination. SEEK IMMEDIATE MEDICAL CARE IF:   You have a fever.  You are leaking fluid from your vagina.  You have spotting or bleeding from your vagina.  You have severe abdominal cramping or pain.  You have rapid weight loss or gain.  You have shortness of breath with chest pain.  You notice sudden or extreme swelling of your face, hands, ankles, feet, or legs.  You have not felt your baby move in over an hour.  You have severe headaches that do not go away with medicine.  You have vision changes.   This information is not intended to replace advice given to you by your health care provider. Make sure you discuss any questions you have with your health care provider.   Document Released: 07/05/2001 Document  Revised: 08/01/2014 Document Reviewed: 09/11/2012 Elsevier Interactive Patient Education 2016 [employer]  Tdap Vaccine (Tetanus, Diphtheria and Pertussis): What You Need to Know 1. Why get vaccinated? Tetanus, diphtheria and pertussis are very serious diseases. Tdap vaccine can protect us from these diseases. And, Tdap vaccine given to pregnant women can protect newborn babies against pertussis. TETANUS (Lockjaw) is rare in the [place] today. It causes painful muscle tightening and stiffness, usually all over the body.  It can lead to tightening of muscles in the head and neck so you can't open your mouth, swallow, or sometimes even breathe. Tetanus kills about 1 out of 10 people who are infected even after receiving the best medical care. DIPHTHERIA is also rare in the [place] today. It can cause a thick coating to form in the back of the throat.  It can lead to breathing problems, heart failure, paralysis, and death. PERTUSSIS (Whooping Cough) causes severe coughing spells, which can cause difficulty breathing, vomiting and disturbed sleep.  It can also lead to weight loss, incontinence, and rib fractures. Up to 2 in 100 adolescents and 5 in 100 adults with pertussis are hospitalized or have complications, which could include pneumonia or death. These diseases are caused by bacteria. Diphtheria and pertussis are spread from person to person through secretions from coughing or sneezing. Tetanus enters the body through cuts, scratches, or wounds. Before vaccines, as many as 200,000 cases of diphtheria, 200,000 cases of pertussis, and hundreds of cases of tetanus, were reported in the [place] each year. Since vaccination began, reports of cases for tetanus and diphtheria have dropped by about 99% and for pertussis by about 80%. 2. Tdap vaccine Tdap vaccine can protect adolescents and adults from tetanus, diphtheria, and pertussis. One dose of Tdap is routinely given at age 11  or 12. People who did not get Tdap at that age should get it as soon as possible. Tdap is especially important for healthcare professionals and anyone having close contact with a baby younger than 12 months. Pregnant women should get a dose of Tdap during every pregnancy, to protect the newborn from pertussis. Infants are most at risk for severe, life-threatening complications from pertussis. Another vaccine, called Td, protects against tetanus and diphtheria, but not pertussis. A Td booster should be given every 10 years. Tdap may be given as one of these boosters if you have never gotten Tdap before. Tdap may also be given after a severe cut or burn to prevent tetanus infection. Your doctor or   the person giving you the vaccine can give you more information. Tdap may safely be given at the same time as other vaccines. 3. Some people should not get this vaccine  A person who has ever had a life-threatening allergic reaction after a previous dose of any diphtheria, tetanus or pertussis containing vaccine, OR has a severe allergy to any part of this vaccine, should not get Tdap vaccine. Tell the person giving the vaccine about any severe allergies.  Anyone who had coma or long repeated seizures within 7 days after a childhood dose of DTP or DTaP, or a previous dose of Tdap, should not get Tdap, unless a cause other than the vaccine was found. They can still get Td.  Talk to your doctor if you:  have seizures or another nervous system problem,  had severe pain or swelling after any vaccine containing diphtheria, tetanus or pertussis,  ever had a condition called Guillain-Barr Syndrome (GBS),  aren't feeling well on the day the shot is scheduled. 4. Risks With any medicine, including vaccines, there is a chance of side effects. These are usually mild and go away on their own. Serious reactions are also possible but are rare. Most people who get Tdap vaccine do not have any problems with it. Mild  problems following Tdap (Did not interfere with activities)  Pain where the shot was given (about 3 in 4 adolescents or 2 in 3 adults)  Redness or swelling where the shot was given (about 1 person in 5)  Mild fever of at least 100.4F (up to about 1 in 25 adolescents or 1 in 100 adults)  Headache (about 3 or 4 people in 10)  Tiredness (about 1 person in 3 or 4)  Nausea, vomiting, diarrhea, stomach ache (up to 1 in 4 adolescents or 1 in 10 adults)  Chills, sore joints (about 1 person in 10)  Body aches (about 1 person in 3 or 4)  Rash, swollen glands (uncommon) Moderate problems following Tdap (Interfered with activities, but did not require medical attention)  Pain where the shot was given (up to 1 in 5 or 6)  Redness or swelling where the shot was given (up to about 1 in 16 adolescents or 1 in 12 adults)  Fever over 102F (about 1 in 100 adolescents or 1 in 250 adults)  Headache (about 1 in 7 adolescents or 1 in 10 adults)  Nausea, vomiting, diarrhea, stomach ache (up to 1 or 3 people in 100)  Swelling of the entire arm where the shot was given (up to about 1 in 500). Severe problems following Tdap (Unable to perform usual activities; required medical attention)  Swelling, severe pain, bleeding and redness in the arm where the shot was given (rare). Problems that could happen after any vaccine:  People sometimes faint after a medical procedure, including vaccination. Sitting or lying down for about 15 minutes can help prevent fainting, and injuries caused by a fall. Tell your doctor if you feel dizzy, or have vision changes or ringing in the ears.  Some people get severe pain in the shoulder and have difficulty moving the arm where a shot was given. This happens very rarely.  Any medication can cause a severe allergic reaction. Such reactions from a vaccine are very rare, estimated at fewer than 1 in a million doses, and would happen within a few minutes to a few hours  after the vaccination. As with any medicine, there is a very remote chance of a vaccine causing   a serious injury or death. The safety of vaccines is always being monitored. For more information, visit: www.cdc.gov/vaccinesafety/ 5. What if there is a serious problem? What should I look for?  Look for anything that concerns you, such as signs of a severe allergic reaction, very high fever, or unusual behavior.  Signs of a severe allergic reaction can include hives, swelling of the face and throat, difficulty breathing, a fast heartbeat, dizziness, and weakness. These would usually start a few minutes to a few hours after the vaccination. What should I do?  If you think it is a severe allergic reaction or other emergency that can't wait, call 9-1-1 or get the person to the nearest hospital. Otherwise, call your doctor.  Afterward, the reaction should be reported to the Vaccine Adverse Event Reporting System (VAERS). Your doctor might file this report, or you can do it yourself through the VAERS web site at www.vaers.hhs.gov, or by calling 1-800-822-7967. VAERS does not give medical advice.  6. The National Vaccine Injury Compensation Program The National Vaccine Injury Compensation Program (VICP) is a federal program that was created to compensate people who may have been injured by certain vaccines. Persons who believe they may have been injured by a vaccine can learn about the program and about filing a claim by calling 1-800-338-2382 or visiting the VICP website at www.hrsa.gov/vaccinecompensation. There is a time limit to file a claim for compensation. 7. How can I learn more?  Ask your doctor. He or she can give you the vaccine package insert or suggest other sources of information.  Call your local or state health department.  Contact the Centers for Disease Control and Prevention (CDC):  Call 1-800-232-4636 (1-800-CDC-INFO) or  Visit CDC's website at www.cdc.gov/vaccines CDC Tdap  Vaccine VIS (09/17/13)   This information is not intended to replace advice given to you by your health care provider. Make sure you discuss any questions you have with your health care provider.   Document Released: 01/10/2012 Document Revised: 08/01/2014 Document Reviewed: 10/23/2013 Elsevier Interactive Patient Education 2016 Elsevier Inc.   

## 2015-09-07 NOTE — Progress Notes (Signed)
Subjective:  Robin [name] is a 30 y.o. [phone] at [redacted]w[redacted]d being seen today for ongoing prenatal care.  She is currently monitored for the following issues for this low-risk pregnancy and has Family hx of hydrocephalus; Family hx of bicuspid aortic valve--FOB; Normal pregnancy, repeat; Vaginal delivery; History of abnormal Pap smear; history of genital warts; History of smoking; Supervision of normal pregnancy; and UTI in pregnancy, antepartum on her problem list.  Patient reports no complaints.  Contractions: Not present. Vag. Bleeding: None.  Movement: Present. Denies leaking of fluid.   The following portions of the patient's history were reviewed and updated as appropriate: allergies, current medications, past family history, past medical history, past social history, past surgical history and problem list. Problem list updated.  Objective:   Filed Vitals:   09/07/15 0932  BP: 110/60  Pulse: 75  Weight: 119 lb (53.978 kg)    Fetal Status: Fetal Heart Rate (bpm): 138   Movement: Present     General:  Alert, oriented and cooperative. Patient is in no acute distress.  Skin: Skin is warm and dry. No rash noted.   Cardiovascular: Normal heart rate noted  Respiratory: Normal respiratory effort, no problems with respiration noted  Abdomen: Soft, gravid, appropriate for gestational age. Pain/Pressure: Absent     Pelvic: Vag. Bleeding: None Vag D/C Character: Thin   Cervical exam deferred        Extremities: Normal range of motion.  Edema: None  Mental Status: Normal mood and affect. Normal behavior. Normal judgment and thought content.   Urinalysis: Urine Protein: Negative Urine Glucose: Negative  Assessment and Plan:  Pregnancy: [id number] at [redacted]w[redacted]d  1. Supervision of normal pregnancy, second trimester  - Glucose Tolerance, 1 HR (50g) - HIV antibody - RPR - CBC - Tdap vaccine greater than or equal to 7yo IM  Preterm labor symptoms and general obstetric precautions including but not  limited to vaginal bleeding, contractions, leaking of fluid and fetal movement were reviewed in detail with the patient. Please refer to After Visit Summary for other counseling recommendations.  TDaP declined Return in about 2 weeks (around 09/21/2015).   Dorathy Kinsman, CNM

## 2015-09-08 ENCOUNTER — Telehealth: Payer: Self-pay | Admitting: *Deleted

## 2015-09-08 LAB — RPR

## 2015-09-08 LAB — CBC
HCT: 36 % (ref 36.0–46.0)
HEMOGLOBIN: 11.7 g/dL — AB (ref 12.0–15.0)
MCH: 31 pg (ref 26.0–34.0)
MCHC: 32.5 g/dL (ref 30.0–36.0)
MCV: 95.5 fL (ref 78.0–100.0)
MPV: 9.7 fL (ref 8.6–12.4)
PLATELETS: 250 10*3/uL (ref 150–400)
RBC: 3.77 MIL/uL — AB (ref 3.87–5.11)
RDW: 12.8 % (ref 11.5–15.5)
WBC: 9.4 10*3/uL (ref 4.0–10.5)

## 2015-09-08 LAB — HIV ANTIBODY (ROUTINE TESTING W REFLEX): HIV: NONREACTIVE

## 2015-09-08 LAB — GLUCOSE TOLERANCE, 1 HOUR (50G) W/O FASTING: Glucose, 1 Hour GTT: 79 mg/dL (ref 70–140)

## 2015-09-08 NOTE — Telephone Encounter (Signed)
Pt notified of normal 1 hr GTT. 

## 2015-10-05 ENCOUNTER — Ambulatory Visit (INDEPENDENT_AMBULATORY_CARE_PROVIDER_SITE_OTHER): Payer: Managed Care, Other (non HMO) | Admitting: Certified Nurse Midwife

## 2015-10-05 ENCOUNTER — Encounter: Payer: Self-pay | Admitting: *Deleted

## 2015-10-05 VITALS — BP 116/66 | HR 99 | Wt 121.0 lb

## 2015-10-05 DIAGNOSIS — Z3493 Encounter for supervision of normal pregnancy, unspecified, third trimester: Secondary | ICD-10-CM

## 2015-10-05 DIAGNOSIS — Z3483 Encounter for supervision of other normal pregnancy, third trimester: Secondary | ICD-10-CM

## 2015-10-05 NOTE — Patient Instructions (Signed)

## 2015-10-05 NOTE — Progress Notes (Signed)
Subjective:  Robin [name] is a 30 y.o. [phone] at 7767w0d being seen today for ongoing prenatal care.  She is currently monitored for the following issues for this low-risk pregnancy and has Family hx of hydrocephalus; Family hx of bicuspid aortic valve--FOB; Vaginal delivery; History of abnormal Pap smear; history of genital warts; History of smoking; Supervision of normal pregnancy; and UTI in pregnancy, antepartum on her problem list.  Patient reports no complaints.  Contractions: Not present. Vag. Bleeding: None.  Movement: Present. Denies leaking of fluid.   The following portions of the patient's history were reviewed and updated as appropriate: allergies, current medications, past family history, past medical history, past social history, past surgical history and problem list. Problem list updated.  Objective:   Filed Vitals:   10/05/15 0940  BP: 116/66  Pulse: 99  Weight: 121 lb (54.885 kg)    Fetal Status: Fetal Heart Rate (bpm): 138 Fundal Height: 31 cm Movement: Present     General:  Alert, oriented and cooperative. Patient is in no acute distress.  Skin: Skin is warm and dry. No rash noted.   Cardiovascular: Normal heart rate noted  Respiratory: Normal respiratory effort, no problems with respiration noted  Abdomen: Soft, gravid, appropriate for gestational age. Pain/Pressure: Present     Pelvic: Vag. Bleeding: None Vag D/C Character: Thin   Cervical exam deferred        Extremities: Normal range of motion.  Edema: None  Mental Status: Normal mood and affect. Normal behavior. Normal judgment and thought content.   Urinalysis: Urine Protein: Negative Urine Glucose: Negative  Assessment and Plan:  Pregnancy: [id number] at 4667w0d  1. Supervision of normal pregnancy, third trimester   Preterm labor symptoms and general obstetric precautions including but not limited to vaginal bleeding, contractions, leaking of fluid and fetal movement were reviewed in detail with the  patient. Please refer to After Visit Summary for other counseling recommendations.  No Follow-up on file. Baby scripts RTO 4 weeks  [name], CNM

## 2015-11-02 ENCOUNTER — Ambulatory Visit (INDEPENDENT_AMBULATORY_CARE_PROVIDER_SITE_OTHER): Payer: Managed Care, Other (non HMO) | Admitting: Certified Nurse Midwife

## 2015-11-02 ENCOUNTER — Other Ambulatory Visit: Payer: Self-pay | Admitting: Certified Nurse Midwife

## 2015-11-02 VITALS — BP 97/64 | HR 103 | Wt 124.0 lb

## 2015-11-02 DIAGNOSIS — Z36 Encounter for antenatal screening of mother: Secondary | ICD-10-CM | POA: Diagnosis not present

## 2015-11-02 DIAGNOSIS — Z3483 Encounter for supervision of other normal pregnancy, third trimester: Secondary | ICD-10-CM

## 2015-11-02 DIAGNOSIS — Z3493 Encounter for supervision of normal pregnancy, unspecified, third trimester: Secondary | ICD-10-CM

## 2015-11-02 NOTE — Patient Instructions (Signed)
Group B streptococcus (GBS) is a type of bacteria often found in healthy women. GBS is not the same as the bacteria that causes strep throat. You may have GBS in your vagina, rectum, or bladder. GBS does not spread through sexual contact, but it can be passed to a baby during childbirth. This can be dangerous for your baby. It is not dangerous to you and usually does not cause any symptoms. Your health care provider may test you for GBS when your pregnancy is between 35 and 37 weeks. GBS is dangerous only during birth, so there is no need to test for it earlier. It is possible to have GBS during pregnancy and never pass it to your baby. If your test results are positive for GBS, your health care provider may recommend giving you antibiotic medicine during delivery to make sure your baby stays healthy. RISK FACTORS You are more likely to pass GBS to your baby if:   Your water breaks (ruptured membrane) or you go into labor before 37 weeks.  Your water breaks 18 hours before you deliver.  You passed GBS during a previous pregnancy.  You have a urinary tract infection caused by GBS any time during pregnancy.  You have a fever during labor. SYMPTOMS Most women who have GBS do not have any symptoms. If you have a urinary tract infection caused by GBS, you might have frequent or painful urination and fever. Babies who get GBS usually show symptoms within 7 days of birth. Symptoms may include:   Breathing problems.  Heart and blood pressure problems.  Digestive and kidney problems. DIAGNOSIS Routine screening for GBS is recommended for all pregnant women. A health care provider takes a sample of the fluid in your vagina and rectum with a swab. It is then sent to a lab to be checked for GBS. A sample of your urine may also be checked for the bacteria.  TREATMENT If you test positive for GBS, you may need treatment with an antibiotic medicine during labor. As soon as you go into labor, or as soon as  your membranes rupture, you will get the antibiotic medicine through an IV access. You will continue to get the medicine until after you give birth. You do not need antibiotic medicine if you are having a cesarean delivery.If your baby shows signs or symptoms of GBS after birth, your baby can also be treated with an antibiotic medicine. HOME CARE INSTRUCTIONS   Take all antibiotic medicine as prescribed by your health care provider. Only take medicine as directed.   Continue with prenatal visits and care.   Keep all follow-up appointments.  SEEK MEDICAL CARE IF:   You have pain when you urinate.   You have to urinate frequently.   You have a fever.  SEEK IMMEDIATE MEDICAL CARE IF:   Your membranes rupture.  You go into labor.   This information is not intended to replace advice given to you by your health care provider. Make sure you discuss any questions you have with your health care provider.   Document Released: 10/18/2007 Document Revised: 07/16/2013 Document Reviewed: 05/03/2013 Elsevier Interactive Patient Education 2016 [employer] Inc.  

## 2015-11-02 NOTE — Progress Notes (Signed)
Subjective:  Robin [name] is a 30 y.o. [phone] at 3715w0d being seen today for ongoing prenatal care.  She is currently monitored for the following issues for this low-risk pregnancy and has Family hx of hydrocephalus; Family hx of bicuspid aortic valve--FOB; Vaginal delivery; History of abnormal Pap smear; history of genital warts; History of smoking; Supervision of normal pregnancy; and UTI in pregnancy, antepartum on her problem list.  Patient reports no complaints.  Contractions: Not present. Vag. Bleeding: None.  Movement: Present. Denies leaking of fluid.   The following portions of the patient's history were reviewed and updated as appropriate: allergies, current medications, past family history, past medical history, past social history, past surgical history and problem list. Problem list updated.  Objective:   Filed Vitals:   11/02/15 0933  BP: 97/64  Pulse: 103  Weight: 124 lb (56.246 kg)    Fetal Status: Fetal Heart Rate (bpm): 152   Movement: Present     General:  Alert, oriented and cooperative. Patient is in no acute distress.  Skin: Skin is warm and dry. No rash noted.   Cardiovascular: Normal heart rate noted  Respiratory: Normal respiratory effort, no problems with respiration noted  Abdomen: Soft, gravid, appropriate for gestational age. Pain/Pressure: Present     Pelvic: Vag. Bleeding: None Vag D/C Character: Thin   Cervical exam performed        Extremities: Normal range of motion.  Edema: None  Mental Status: Normal mood and affect. Normal behavior. Normal judgment and thought content.   Urinalysis: Urine Protein: Trace Urine Glucose: Negative  Assessment and Plan:  Pregnancy: [id number] at 3915w0d  1. Normal pregnancy in third trimester  - Culture, beta strep (group b only) - GC/chlamydia probe amp, urine  2. Supervision of normal pregnancy, third trimester   Preterm labor symptoms and general obstetric precautions including but not limited to vaginal  bleeding, contractions, leaking of fluid and fetal movement were reviewed in detail with the patient. Please refer to After Visit Summary for other counseling recommendations.  Return in about 1 week (around 11/09/2015).   Rhea PinkLori A Clemmons, CNM

## 2015-11-04 LAB — GC/CHLAMYDIA PROBE AMP
CT Probe RNA: NOT DETECTED
GC Probe RNA: NOT DETECTED

## 2015-11-04 LAB — CULTURE, BETA STREP (GROUP B ONLY)

## 2015-11-16 ENCOUNTER — Ambulatory Visit (INDEPENDENT_AMBULATORY_CARE_PROVIDER_SITE_OTHER): Payer: Managed Care, Other (non HMO) | Admitting: Advanced Practice Midwife

## 2015-11-16 ENCOUNTER — Encounter: Payer: Self-pay | Admitting: Advanced Practice Midwife

## 2015-11-16 DIAGNOSIS — Z3493 Encounter for supervision of normal pregnancy, unspecified, third trimester: Secondary | ICD-10-CM

## 2015-11-16 DIAGNOSIS — Z3483 Encounter for supervision of other normal pregnancy, third trimester: Secondary | ICD-10-CM

## 2015-11-16 NOTE — Patient Instructions (Signed)
Vaginal Delivery °During delivery, your health care provider will help you give birth to your baby. During a vaginal delivery, you will work to push the baby out of your vagina. However, before you can push your baby out, a few things need to happen. The opening of your uterus (cervix) has to soften, thin out, and open up (dilate) all the way to 10 cm. Also, your baby has to move down from the uterus into your vagina.  °SIGNS OF LABOR  °Your health care provider will first need to make sure you are in labor. Signs of labor include:  °· Passing what is called the mucous plug before labor begins. This is a small amount of blood-stained mucus. °· Having regular, painful uterine contractions.   °· The time between contractions gets shorter.   °· The discomfort and pain gradually get more intense. °· Contraction pains get worse when walking and do not go away when resting.   °· Your cervix becomes thinner (effacement) and dilates. °BEFORE THE DELIVERY °Once you are in labor and admitted into the hospital or care center, your health care provider may do the following:  °· Perform a complete physical exam. °· Review any complications related to pregnancy or labor.  °· Check your blood pressure, pulse, temperature, and heart rate (vital signs).   °· Determine if, and when, the rupture of amniotic membranes occurred. °· Do a vaginal exam (using a sterile glove and lubricant) to determine:   °¨ The position (presentation) of the baby. Is the baby's head presenting first (vertex) in the birth canal (vagina), or are the feet or buttocks first (breech)?   °¨ The level (station) of the baby's head within the birth canal.   °¨ The effacement and dilatation of the cervix.   °· An electronic fetal monitor is usually placed on your abdomen when you first arrive. This is used to monitor your contractions and the baby's heart rate. °¨ When the monitor is on your abdomen (external fetal monitor), it can only pick up the frequency and  length of your contractions. It cannot tell the strength of your contractions. °¨ If it becomes necessary for your health care provider to know exactly how strong your contractions are or to see exactly what the baby's heart rate is doing, an internal monitor may be inserted into your vagina and uterus. Your health care provider will discuss the benefits and risks of using an internal monitor and obtain your permission before inserting the device. °¨ Continuous fetal monitoring may be needed if you have an epidural, are receiving certain medicines (such as oxytocin), or have pregnancy or labor complications. °· An IV access tube may be placed into a vein in your arm to deliver fluids and medicines if necessary. °THREE STAGES OF LABOR AND DELIVERY °Normal labor and delivery is divided into three stages. °First Stage °This stage starts when you begin to contract regularly and your cervix begins to efface and dilate. It ends when your cervix is completely open (fully dilated). The first stage is the longest stage of labor and can last from 3 hours to 15 hours.  °Several methods are available to help with labor pain. You and your health care provider will decide which option is best for you. Options include:  °· Opioid medicines. These are strong pain medicines that you can get through your IV tube or as a shot into your muscle. These medicines lessen pain but do not make it go away completely.  °· Epidural. A medicine is given through a thin tube that   is inserted in your back. The medicine numbs the lower part of your body and prevents any pain in that area. °· Paracervical pain medicine. This is an injection of an anesthetic on each side of your cervix.   °· You may request natural childbirth, which does not involve the use of pain medicines or an epidural during labor and delivery. Instead, you will use other things, such as breathing exercises, to help cope with the pain. °Second Stage °The second stage of labor  begins when your cervix is fully dilated at 10 cm. It continues until you push your baby down through the birth canal and the baby is born. This stage can take only minutes or several hours. °· The location of your baby's head as it moves through the birth canal is reported as a number called a station. If the baby's head has not started its descent, the station is described as being at minus 3 (-3). When your baby's head is at the zero station, it is at the middle of the birth canal and is engaged in the pelvis. The station of your baby helps indicate the progress of the second stage of labor. °· When your baby is born, your health care provider may hold the baby with his or her head lowered to prevent amniotic fluid, mucus, and blood from getting into the baby's lungs. The baby's mouth and nose may be suctioned with a small bulb syringe to remove any additional fluid. °· Your health care provider may then place the baby on your stomach. It is important to keep the baby from getting cold. To do this, the health care provider will dry the baby off, place the baby directly on your skin (with no blankets between you and the baby), and cover the baby with warm, dry blankets.   °· The umbilical cord is cut. °Third Stage °During the third stage of labor, your health care provider will deliver the placenta (afterbirth) and make sure your bleeding is under control. The delivery of the placenta usually takes about 5 minutes but can take up to 30 minutes. After the placenta is delivered, a medicine may be given either by IV or injection to help contract the uterus and control bleeding. If you are planning to breastfeed, you can try to do so now. °After you deliver the placenta, your uterus should contract and get very firm. If your uterus does not remain firm, your health care provider will massage it. This is important because the contraction of the uterus helps cut off bleeding at the site where the placenta was attached  to your uterus. If your uterus does not contract properly and stay firm, you may continue to bleed heavily. If there is a lot of bleeding, medicines may be given to contract the uterus and stop the bleeding.  °  °This information is not intended to replace advice given to you by your health care provider. Make sure you discuss any questions you have with your health care provider. °  °Document Released: 04/19/2008 Document Revised: 08/01/2014 Document Reviewed: 03/07/2012 °Elsevier Interactive Patient Education ©2016 [employer] ° °

## 2015-11-16 NOTE — Progress Notes (Signed)
Subjective:  Robin [name] is a 30 y.o. [phone] at 2074w0d being seen today for ongoing prenatal care.  She is currently monitored for the following issues for this low-risk pregnancy and has Family hx of hydrocephalus; Family hx of bicuspid aortic valve--FOB; Vaginal delivery; History of abnormal Pap smear; history of genital warts; History of smoking; Supervision of normal pregnancy; and UTI in pregnancy, antepartum on her problem list.  Patient reports no complaints.  Contractions: Not present. Vag. Bleeding: None.  Movement: Present. Denies leaking of fluid.   The following portions of the patient's history were reviewed and updated as appropriate: allergies, current medications, past family history, past medical history, past social history, past surgical history and problem list. Problem list updated.  Objective:   Filed Vitals:   11/16/15 1548  Weight: 127 lb (57.607 kg)    Fetal Status: Fetal Heart Rate (bpm): 138   Movement: Present     General:  Alert, oriented and cooperative. Patient is in no acute distress.  Skin: Skin is warm and dry. No rash noted.   Cardiovascular: Normal heart rate noted  Respiratory: Normal respiratory effort, no problems with respiration noted  Abdomen: Soft, gravid, appropriate for gestational age. Pain/Pressure: Present     Pelvic: Vag. Bleeding: None Vag D/C Character: Thin   Cervical exam deferred        Extremities: Normal range of motion.  Edema: None  Mental Status: Normal mood and affect. Normal behavior. Normal judgment and thought content.   Urinalysis: Urine Protein: Negative Urine Glucose: Negative  Assessment and Plan:  Pregnancy: [id number] at 6974w0d  1. Vaginal delivery      History of delivery in car on the way to hospital (not planned that way)      Does plan homebirth with this pregnancy.  Using a doula and non nurse midwife.  States is willing to and wants to continue here for prenatal care.  [facility]  any concerns or problems. She does want to come here for postpartum visit.  2. Supervision of normal pregnancy, third trimester      Continue Babyscripts  Term labor symptoms and general obstetric precautions including but not limited to vaginal bleeding, contractions, leaking of fluid and fetal movement were reviewed in detail with the patient. Please refer to After Visit Summary for other counseling recommendations.   RTO 1 week   Aviva SignsMarie L Liam Cammarata, CNM

## 2015-11-23 ENCOUNTER — Ambulatory Visit (INDEPENDENT_AMBULATORY_CARE_PROVIDER_SITE_OTHER): Payer: Managed Care, Other (non HMO) | Admitting: Obstetrics & Gynecology

## 2015-11-23 VITALS — BP 116/78 | HR 114 | Wt 126.0 lb

## 2015-11-23 DIAGNOSIS — Z3493 Encounter for supervision of normal pregnancy, unspecified, third trimester: Secondary | ICD-10-CM

## 2015-11-23 DIAGNOSIS — Z3483 Encounter for supervision of other normal pregnancy, third trimester: Secondary | ICD-10-CM

## 2015-11-23 NOTE — Progress Notes (Signed)
Subjective:  Robin [name] is a 30 y.o. [phone] at 4943w0d being seen today for ongoing prenatal care.  She is currently monitored for the following issues for this low-risk pregnancy and has Family hx of hydrocephalus; Family hx of bicuspid aortic valve--FOB; Vaginal delivery; History of abnormal Pap smear; history of genital warts; History of smoking; Supervision of normal pregnancy; and UTI in pregnancy, antepartum on her problem list.  Patient reports non-productive cough for the last few days, has not tried OTC meds yet. Denies fevers..  Contractions: Irregular. Vag. Bleeding: None.  Movement: Present. Denies leaking of fluid.   The following portions of the patient's history were reviewed and updated as appropriate: allergies, current medications, past family history, past medical history, past social history, past surgical history and problem list. Problem list updated.  Objective:   Filed Vitals:   11/23/15 1608  BP: 116/78  Pulse: 114  Weight: 126 lb (57.153 kg)    Fetal Status: Fetal Heart Rate (bpm): 158   Movement: Present     General:  Alert, oriented and cooperative. Patient is in no acute distress.  Skin: Skin is warm and dry. No rash noted.   Cardiovascular: Normal heart rate noted  Respiratory: Normal respiratory effort, no problems with respiration noted  Abdomen: Soft, gravid, appropriate for gestational age. Pain/Pressure: Present     Pelvic: Vag. Bleeding: None Vag D/C Character: Thin   Cervical exam performed        Extremities: Normal range of motion.  Edema: None  Mental Status: Normal mood and affect. Normal behavior. Normal judgment and thought content.   Urinalysis: Urine Protein: Negative Urine Glucose: Negative  Assessment and Plan:  Pregnancy: [id number] at 9543w0d  1. Supervision of normal pregnancy, third trimester Rec OTC cough meds. Reassurance given.  Term labor symptoms and general obstetric precautions including but not limited to vaginal bleeding,  contractions, leaking of fluid and fetal movement were reviewed in detail with the patient. Please refer to After Visit Summary for other counseling recommendations.  Return in about 1 week (around 11/30/2015).   Allie BossierMyra C Amyjo Mizrachi, MD

## 2015-11-30 ENCOUNTER — Ambulatory Visit (INDEPENDENT_AMBULATORY_CARE_PROVIDER_SITE_OTHER): Payer: Managed Care, Other (non HMO) | Admitting: Obstetrics & Gynecology

## 2015-11-30 VITALS — BP 128/79 | HR 102 | Wt 129.0 lb

## 2015-11-30 DIAGNOSIS — Z3493 Encounter for supervision of normal pregnancy, unspecified, third trimester: Secondary | ICD-10-CM

## 2015-11-30 DIAGNOSIS — Z3483 Encounter for supervision of other normal pregnancy, third trimester: Secondary | ICD-10-CM | POA: Diagnosis not present

## 2015-11-30 DIAGNOSIS — O480 Post-term pregnancy: Secondary | ICD-10-CM | POA: Diagnosis not present

## 2015-11-30 NOTE — Addendum Note (Signed)
Addended by: Granville LewisLARK, Dominic Rhome L on: 11/30/2015 03:41 PM   Modules accepted: Orders

## 2015-11-30 NOTE — Progress Notes (Signed)
Subjective:  Robin [name] is a 30 y.o. [phone] at 9648w0d being seen today for ongoing prenatal care.  She is currently monitored for the following issues for this low-risk pregnancy and has Family hx of hydrocephalus; Family hx of bicuspid aortic valve--FOB; Vaginal delivery; History of abnormal Pap smear; history of genital warts; History of smoking; Supervision of normal pregnancy; and UTI in pregnancy, antepartum on her problem list.  Patient reports no complaints.  Contractions: Irregular. Vag. Bleeding: None.  Movement: Present. Denies leaking of fluid.   The following portions of the patient's history were reviewed and updated as appropriate: allergies, current medications, past family history, past medical history, past social history, past surgical history and problem list. Problem list updated.  Objective:   Filed Vitals:   11/30/15 1447  BP: 128/79  Pulse: 102  Weight: 129 lb (58.514 kg)    Fetal Status: Fetal Heart Rate (bpm): 137   Movement: Present     General:  Alert, oriented and cooperative. Patient is in no acute distress.  Skin: Skin is warm and dry. No rash noted.   Cardiovascular: Normal heart rate noted  Respiratory: Normal respiratory effort, no problems with respiration noted  Abdomen: Soft, gravid, appropriate for gestational age. Pain/Pressure: Present     Pelvic: Vag. Bleeding: None Vag D/C Character: Thin   Cervical exam performed        Extremities: Normal range of motion.  Edema: None  Mental Status: Normal mood and affect. Normal behavior. Normal judgment and thought content.   Urinalysis: Urine Protein: Negative Urine Glucose: Negative  Assessment and Plan:  Pregnancy: [id number] at 3048w0d  1. Supervision of normal pregnancy, third trimester   Term labor symptoms and general obstetric precautions including but not limited to vaginal bleeding, contractions, leaking of fluid and fetal movement were reviewed in detail with the patient. Please refer to  After Visit Summary for other counseling recommendations.  No Follow-up on file.   Allie BossierMyra C Rachel Samples, MD

## 2015-11-30 NOTE — Progress Notes (Signed)
Pt states that she will do NST today but does not want to have anymore.  She is planning a home birth and her midwife is coming to her house now twice weekly.  She states that she will call if she feels she needs to return.  Explained to patient the risks and rational of why she needs post date NST.  Pt states that she is aware of the risks and she fully understands.

## 2016-01-19 ENCOUNTER — Encounter: Payer: Self-pay | Admitting: *Deleted

## 2016-01-19 ENCOUNTER — Ambulatory Visit (INDEPENDENT_AMBULATORY_CARE_PROVIDER_SITE_OTHER): Payer: Managed Care, Other (non HMO) | Admitting: Obstetrics & Gynecology

## 2016-01-19 MED ORDER — LIDOCAINE 5 % EX OINT: 1.0000 "application " | TOPICAL_OINTMENT | CUTANEOUS | Status: DC | PRN

## 2016-01-19 NOTE — Progress Notes (Signed)
Patient ID: Robin [name], female   DOB: 1986/02/09, 30 y.o.   MRN: [id number] Post Partum Exam  Robin [name] is a 30 y.o. [phone] female who presents for a postpartum visit. She is 6 weeks postpartum following a spontaneous vaginal home delivery. I have fully reviewed the prenatal and intrapartum course. The delivery was at 41w 1d gestational weeks.  Anesthesia: none. Postpartum course has been unremarkable. Baby's course has been difficult.  She as tongue and lip tied . Baby is feeding by donor milk. Bleeding no bleeding. Bowel function is normal. Bladder function is normal. Patient is not sexually active. Contraception method is none. Postpartum depression screening: negative.  The following portions of the patient's history were reviewed and updated as appropriate: allergies, current medications, past family history, past medical history, past social history, past surgical history and problem list.  Review of Systems Pertinent items noted in HPI and remainder of comprehensive ROS otherwise negative.   Objective:    BP 116/78 mmHg  Pulse 78  Resp 16  Ht 5\' 5"  (1.651 m)  Wt 211 lb (95.709 kg)  BMI 35.11 kg/m2  Breastfeeding? Yes  General:  alert, cooperative and no distress   Breasts:  negative  Lungs: clear to auscultation bilaterally  Heart:  regular rate and rhythm  Abdomen: soft, non-tender; bowel sounds normal; no masses,  no organomegaly   Vulva:  normal  Vagina: mild discomfort with speculum exam  Cervix:  no lesions  Corpus: normal size, contour, position, consistency, mobility, non-tender  Adnexa:  normal adnexa  Rectal Exam: Not performed.        Assessment:    nml postpartum exam. Pap smear due 2019  Plan:    1. Contraception: vasectomy planned 2. Lidocaine ointment for intercourse discomfort 3. Follow up in: 1 year or as needed.

## 2016-04-25 ENCOUNTER — Other Ambulatory Visit: Payer: Managed Care, Other (non HMO)

## 2016-04-26 ENCOUNTER — Other Ambulatory Visit (INDEPENDENT_AMBULATORY_CARE_PROVIDER_SITE_OTHER): Payer: Managed Care, Other (non HMO)

## 2016-04-26 DIAGNOSIS — O4691 Antepartum hemorrhage, unspecified, first trimester: Secondary | ICD-10-CM | POA: Diagnosis not present

## 2016-04-26 DIAGNOSIS — O4690 Antepartum hemorrhage, unspecified, unspecified trimester: Secondary | ICD-10-CM

## 2016-04-27 LAB — HCG, QUANTITATIVE, PREGNANCY: hCG, Beta Chain, Quant, S: 68923.6 m[IU]/mL — ABNORMAL HIGH

## 2016-05-02 ENCOUNTER — Telehealth: Payer: Self-pay | Admitting: *Deleted

## 2016-05-02 NOTE — Telephone Encounter (Signed)
-----   Message from Lesly DukesKelly H Leggett, MD sent at 04/29/2016  5:39 AM EDT ----- Pt needs to start prenatal care.  She will need F/U US early second trimester (at time of first screen).  Check for fetal heart tones at each visit.  Pt may experience moderate bleeding as hematoma resolves.  Miscarriage is a possibility given large subchorionic hemorrhage.  RN to call patient.

## 2016-05-02 NOTE — Telephone Encounter (Signed)
Pt notified of results and her NOB is scheduled for 05/04/16

## 2016-05-04 ENCOUNTER — Ambulatory Visit (INDEPENDENT_AMBULATORY_CARE_PROVIDER_SITE_OTHER): Payer: Managed Care, Other (non HMO) | Admitting: Obstetrics & Gynecology

## 2016-05-04 ENCOUNTER — Encounter: Payer: Self-pay | Admitting: Obstetrics & Gynecology

## 2016-05-04 DIAGNOSIS — Z113 Encounter for screening for infections with a predominantly sexual mode of transmission: Secondary | ICD-10-CM | POA: Diagnosis not present

## 2016-05-04 DIAGNOSIS — Z3689 Encounter for other specified antenatal screening: Secondary | ICD-10-CM | POA: Diagnosis not present

## 2016-05-04 DIAGNOSIS — Z3480 Encounter for supervision of other normal pregnancy, unspecified trimester: Secondary | ICD-10-CM

## 2016-05-04 DIAGNOSIS — Z3481 Encounter for supervision of other normal pregnancy, first trimester: Secondary | ICD-10-CM

## 2016-05-04 NOTE — Progress Notes (Signed)
Subjective:    Robin [name] is a [id number] [redacted]w[redacted]d being seen today for her first obstetrical visit.  Her obstetrical history is significant for short interval between pregnancies. Patient does intend to breast feed. Pregnancy history fully reviewed.  Patient reports no complaints.  Vitals:   05/04/16 0856  BP: 113/68  Pulse: (!) 102  Weight: 109 lb (49.4 kg)    HISTORY: OB History  Gravida Para Term Preterm AB Living  5 3 3   1 3   SAB TAB Ectopic Multiple Live Births  1       2    # Outcome Date GA Lbr Len/2nd Weight Sex Delivery Anes PTL Lv  5 Current           4 SAB 01/31/14 [redacted]w[redacted]d    SAB   FD  3 Term 08/09/12 [redacted]w[redacted]d  7 lb 6.5 oz (3.36 kg) F Vag-Spont   LIV     Birth Comments: BORN EN ROUTE TO WH  2 Term 07/16/10 [redacted]w[redacted]d 12:00 7 lb 7 oz (3.374 kg) M Vag-Spont EPI Y LIV     Birth Comments: No complications  1 Term              Past Medical History:  Diagnosis Date  . Abnormal Pap smear 2008   HAD HPV;HAD GENITAL WART REMOVED;LAST PAP 07/2010 WAS NORMAL  . Anemia    FeSO4 SUPP TAKEN IN PAST  . Asthma    AS A CHILD;GREW OUT OF @ 8 YOA  . Depression   . History of smoking 12/26/2011   Reports quit '12  . Infection    UTI;CAN GET FREQ  . Infection    YEAST INF;NOT FREQ  . Infection    BV;NOT FREQ   Past Surgical History:  Procedure Laterality Date  . NASAL SINUS SURGERY  2016   Family History  Problem Relation Age of Onset  . Mitral valve prolapse Mother     DIED @ 16 YOA OF CONDITION  . Mitral valve prolapse Paternal Aunt   . Heart attack Paternal Grandfather     DECEASED  . Dementia Maternal Grandmother      Exam    Uterus:     Pelvic Exam:    Perineum: No Hemorrhoids   Vulva: normal   Vagina:  normal mucosa   pH:    Cervix: anteverted   Adnexa: normal adnexa   Bony Pelvis: android  System: Breast:  normal appearance, no masses or tenderness   Skin: normal coloration and turgor, no rashes    Neurologic: oriented   Extremities: normal strength,  tone, and muscle mass   HEENT PERRLA   Mouth/Teeth mucous membranes moist, pharynx normal without lesions   Neck supple   Cardiovascular: regular rate and rhythm   Respiratory:  appears well, vitals normal, no respiratory distress, acyanotic, normal RR, ear and throat exam is normal, neck free of mass or lymphadenopathy, chest clear, no wheezing, crepitations, rhonchi, normal symmetric air entry   Abdomen: soft, non-tender; bowel sounds normal; no masses,  no organomegaly   Urinary: urethral meatus normal      Assessment:    Pregnancy: [id number] Patient Active Problem List   Diagnosis Date Noted  . Supervision of other normal pregnancy, antepartum 05/25/2015  . Family hx of hydrocephalus 04/03/2012  . Family hx of bicuspid aortic valve--FOB 04/03/2012  . History of abnormal Pap smear 01/02/2012  . history of genital warts 12/26/2011  . History of smoking 12/26/2011  Plan:     Initial labs drawn. Prenatal vitamins. Problem list reviewed and updated. Genetic Screening discussed Quad Screen: declined.  Ultrasound discussed; fetal survey: requested.  Follow up in 4 weeks.  Robin [name] 05/04/2016

## 2016-05-04 NOTE — Progress Notes (Signed)
Pt here for NOB intake. Bedside U/S shows IUP with FHT of 182 BPM CRL is 26.861mm  GA is 785w2d.  Pt does have a large subchorionic hemorrhage that is still present

## 2016-05-04 NOTE — Addendum Note (Signed)
Addended by: Granville LewisLARK, LORA L on: 05/04/2016 10:41 AM   Modules accepted: Orders

## 2016-05-05 ENCOUNTER — Encounter: Payer: Self-pay | Admitting: *Deleted

## 2016-05-05 LAB — PRENATAL PROFILE (SOLSTAS)
Antibody Screen: NEGATIVE
BASOS ABS: 0 {cells}/uL (ref 0–200)
Basophils Relative: 0 %
Eosinophils Absolute: 216 cells/uL (ref 15–500)
Eosinophils Relative: 3 %
HEMATOCRIT: 39.1 % (ref 35.0–45.0)
HEP B S AG: NEGATIVE
HIV 1&2 Ab, 4th Generation: NONREACTIVE
Hemoglobin: 12.5 g/dL (ref 11.7–15.5)
Lymphocytes Relative: 16 %
Lymphs Abs: 1152 cells/uL (ref 850–3900)
MCH: 30.3 pg (ref 27.0–33.0)
MCHC: 32 g/dL (ref 32.0–36.0)
MCV: 94.9 fL (ref 80.0–100.0)
MONO ABS: 504 {cells}/uL (ref 200–950)
MPV: 8.6 fL (ref 7.5–12.5)
Monocytes Relative: 7 %
Neutro Abs: 5328 cells/uL (ref 1500–7800)
Neutrophils Relative %: 74 %
Platelets: 281 10*3/uL (ref 140–400)
RBC: 4.12 MIL/uL (ref 3.80–5.10)
RDW: 12.7 % (ref 11.0–15.0)
Rh Type: POSITIVE
Rubella: 1.44 Index — ABNORMAL HIGH (ref <0.90)
WBC: 7.2 10*3/uL (ref 3.8–10.8)

## 2016-05-05 LAB — GLUCOSE, RANDOM: Glucose, Bld: 69 mg/dL (ref 65–99)

## 2016-05-05 LAB — HEMOGLOBIN A1C
Hgb A1c MFr Bld: 4.8 % (ref <5.7)
Mean Plasma Glucose: 91 mg/dL

## 2016-05-05 LAB — URINE CYTOLOGY ANCILLARY ONLY
Chlamydia: NEGATIVE
Neisseria Gonorrhea: NEGATIVE

## 2016-05-09 LAB — CULTURE, URINE COMPREHENSIVE

## 2016-05-12 ENCOUNTER — Encounter: Payer: Self-pay | Admitting: Obstetrics & Gynecology

## 2016-05-12 DIAGNOSIS — O99820 Streptococcus B carrier state complicating pregnancy: Secondary | ICD-10-CM | POA: Insufficient documentation

## 2016-05-24 ENCOUNTER — Ambulatory Visit (INDEPENDENT_AMBULATORY_CARE_PROVIDER_SITE_OTHER): Payer: Managed Care, Other (non HMO) | Admitting: Obstetrics & Gynecology

## 2016-05-24 VITALS — BP 109/66 | HR 88 | Wt 110.0 lb

## 2016-05-24 DIAGNOSIS — Z3480 Encounter for supervision of other normal pregnancy, unspecified trimester: Secondary | ICD-10-CM

## 2016-05-24 DIAGNOSIS — Z3481 Encounter for supervision of other normal pregnancy, first trimester: Secondary | ICD-10-CM

## 2016-05-24 NOTE — Progress Notes (Signed)
   PRENATAL VISIT NOTE  Subjective:  Robin [name] is a 30 y.o. [phone] at 3164w3d being seen today for ongoing prenatal care.  She is currently monitored for the following issues for this low-risk pregnancy and has Family hx of hydrocephalus; Family hx of bicuspid aortic valve--FOB; History of abnormal Pap smear; history of genital warts; History of smoking; Supervision of other normal pregnancy, antepartum; and GBS (group B Streptococcus carrier), +RV culture, currently pregnant on her problem list.  Patient reports continued vaginal bleeding. U/S today at bedside shows subchorionic hemorrhage, getting smaller.   . Vag. Bleeding: Small.   . Denies leaking of fluid.   The following portions of the patient's history were reviewed and updated as appropriate: allergies, current medications, past family history, past medical history, past social history, past surgical history and problem list. Problem list updated.  Objective:   Vitals:   05/24/16 1308  BP: 109/66  Pulse: 88  Weight: 110 lb (49.9 kg)    Fetal Status: Fetal Heart Rate (bpm): 173         General:  Alert, oriented and cooperative. Patient is in no acute distress.  Skin: Skin is warm and dry. No rash noted.   Cardiovascular: Normal heart rate noted  Respiratory: Normal respiratory effort, no problems with respiration noted  Abdomen: Soft, gravid, appropriate for gestational age. Pain/Pressure: Present     Pelvic:  Cervical exam deferred        Extremities: Normal range of motion.  Edema: None  Mental Status: Normal mood and affect. Normal behavior. Normal judgment and thought content.   Assessment and Plan:  Pregnancy: [id number] at 2564w3d  1. Supervision of other normal pregnancy, antepartum - She very much wants to sign up for [facility] - US MFM OB COMP + 14 WK; Future  Preterm labor symptoms and general obstetric precautions including but not limited to vaginal bleeding, contractions, leaking of fluid and fetal  movement were reviewed in detail with the patient. Please refer to After Visit Summary for other counseling recommendations.  Return in about 7 weeks (around 07/12/2016).  Allie BossierMyra C Berton Butrick, MD

## 2016-05-24 NOTE — Progress Notes (Signed)
Increase in vaginal bleeding

## 2016-05-30 ENCOUNTER — Telehealth: Payer: Self-pay | Admitting: *Deleted

## 2016-05-30 DIAGNOSIS — Z3480 Encounter for supervision of other normal pregnancy, unspecified trimester: Secondary | ICD-10-CM

## 2016-05-30 DIAGNOSIS — O219 Vomiting of pregnancy, unspecified: Secondary | ICD-10-CM

## 2016-05-30 MED ORDER — DOXYLAMINE-PYRIDOXINE 10-10 MG PO TBEC: 1.0000 | DELAYED_RELEASE_TABLET | Freq: Every day | ORAL | 3 refills | Status: DC

## 2016-05-30 NOTE — Telephone Encounter (Signed)
Pt called requesting a RX for Diclegis to be sent to [facility] due to severe nausea in this pregnancy

## 2016-06-01 ENCOUNTER — Encounter: Payer: Managed Care, Other (non HMO) | Admitting: Obstetrics & Gynecology

## 2016-06-14 ENCOUNTER — Other Ambulatory Visit (INDEPENDENT_AMBULATORY_CARE_PROVIDER_SITE_OTHER): Payer: Managed Care, Other (non HMO)

## 2016-06-14 DIAGNOSIS — O26892 Other specified pregnancy related conditions, second trimester: Secondary | ICD-10-CM

## 2016-06-14 DIAGNOSIS — N898 Other specified noninflammatory disorders of vagina: Secondary | ICD-10-CM | POA: Diagnosis not present

## 2016-06-14 DIAGNOSIS — Z3689 Encounter for other specified antenatal screening: Secondary | ICD-10-CM

## 2016-06-14 NOTE — Progress Notes (Signed)
Pt here with c/o's vaginal spotting again with this pregnancy.  She does have a subchorionic hemorrhage that is still present.  She also has some pelvic cramping.  Wet prep done today and bedside u/s shows an active fetus with FHT of 158.  HC does measure 162 4 days.  Pt also states that she may be constipated.  I recommended either Miralax or Colace daily.  I also encouraged her to increase her water intake.

## 2016-06-15 ENCOUNTER — Telehealth: Payer: Self-pay | Admitting: *Deleted

## 2016-06-15 DIAGNOSIS — N760 Acute vaginitis: Principal | ICD-10-CM

## 2016-06-15 DIAGNOSIS — B9689 Other specified bacterial agents as the cause of diseases classified elsewhere: Secondary | ICD-10-CM

## 2016-06-15 LAB — WET PREP FOR TRICH, YEAST, CLUE
TRICH WET PREP: NONE SEEN
Yeast Wet Prep HPF POC: NONE SEEN

## 2016-06-15 MED ORDER — METRONIDAZOLE 500 MG PO TABS: 500.0000 mg | ORAL_TABLET | Freq: Two times a day (BID) | ORAL | 0 refills | Status: DC

## 2016-06-15 NOTE — Telephone Encounter (Signed)
Pt notified of positive BV and RX for Flagyl sent to [facility] pharmacy.

## 2016-07-08 ENCOUNTER — Inpatient Hospital Stay (HOSPITAL_COMMUNITY)
Admission: AD | Admit: 2016-07-08 | Discharge: 2016-07-08 | Disposition: A | Discharge status: Home / Self Care | Payer: Managed Care, Other (non HMO) | Source: Ambulatory Visit | Attending: Obstetrics and Gynecology | Admitting: Obstetrics and Gynecology

## 2016-07-08 ENCOUNTER — Ambulatory Visit (HOSPITAL_COMMUNITY)
Admission: RE | Admit: 2016-07-08 | Discharge: 2016-07-08 | Disposition: A | Discharge status: Home / Self Care | Payer: Managed Care, Other (non HMO) | Source: Ambulatory Visit | Attending: Obstetrics & Gynecology | Admitting: Obstetrics & Gynecology

## 2016-07-08 ENCOUNTER — Other Ambulatory Visit: Payer: Self-pay | Admitting: Obstetrics & Gynecology

## 2016-07-08 ENCOUNTER — Encounter (HOSPITAL_COMMUNITY): Payer: Self-pay

## 2016-07-08 DIAGNOSIS — O99512 Diseases of the respiratory system complicating pregnancy, second trimester: Secondary | ICD-10-CM | POA: Diagnosis not present

## 2016-07-08 DIAGNOSIS — Z3A19 19 weeks gestation of pregnancy: Secondary | ICD-10-CM

## 2016-07-08 DIAGNOSIS — O4102X0 Oligohydramnios, second trimester, not applicable or unspecified: Secondary | ICD-10-CM

## 2016-07-08 DIAGNOSIS — O468X2 Other antepartum hemorrhage, second trimester: Secondary | ICD-10-CM

## 2016-07-08 DIAGNOSIS — Z9889 Other specified postprocedural states: Secondary | ICD-10-CM | POA: Insufficient documentation

## 2016-07-08 DIAGNOSIS — O418X20 Other specified disorders of amniotic fluid and membranes, second trimester, not applicable or unspecified: Secondary | ICD-10-CM

## 2016-07-08 DIAGNOSIS — Z79899 Other long term (current) drug therapy: Secondary | ICD-10-CM | POA: Diagnosis not present

## 2016-07-08 DIAGNOSIS — O26892 Other specified pregnancy related conditions, second trimester: Secondary | ICD-10-CM

## 2016-07-08 DIAGNOSIS — Z3A18 18 weeks gestation of pregnancy: Secondary | ICD-10-CM | POA: Diagnosis not present

## 2016-07-08 DIAGNOSIS — N898 Other specified noninflammatory disorders of vagina: Secondary | ICD-10-CM | POA: Diagnosis not present

## 2016-07-08 DIAGNOSIS — O4592 Premature separation of placenta, unspecified, second trimester: Secondary | ICD-10-CM | POA: Insufficient documentation

## 2016-07-08 DIAGNOSIS — Z363 Encounter for antenatal screening for malformations: Secondary | ICD-10-CM

## 2016-07-08 DIAGNOSIS — Z87891 Personal history of nicotine dependence: Secondary | ICD-10-CM | POA: Insufficient documentation

## 2016-07-08 DIAGNOSIS — O99342 Other mental disorders complicating pregnancy, second trimester: Secondary | ICD-10-CM | POA: Diagnosis not present

## 2016-07-08 DIAGNOSIS — Z3480 Encounter for supervision of other normal pregnancy, unspecified trimester: Secondary | ICD-10-CM

## 2016-07-08 LAB — CBC WITH DIFFERENTIAL/PLATELET
BASOS ABS: 0 10*3/uL (ref 0.0–0.1)
BASOS PCT: 0 %
Eosinophils Absolute: 0.2 10*3/uL (ref 0.0–0.7)
Eosinophils Relative: 2 %
HEMATOCRIT: 33.9 % — AB (ref 36.0–46.0)
Hemoglobin: 11.7 g/dL — ABNORMAL LOW (ref 12.0–15.0)
Lymphocytes Relative: 18 %
Lymphs Abs: 1.7 10*3/uL (ref 0.7–4.0)
MCH: 30.7 pg (ref 26.0–34.0)
MCHC: 34.5 g/dL (ref 30.0–36.0)
MCV: 89 fL (ref 78.0–100.0)
MONO ABS: 0.5 10*3/uL (ref 0.1–1.0)
Monocytes Relative: 5 %
NEUTROS ABS: 7.2 10*3/uL (ref 1.7–7.7)
NEUTROS PCT: 75 %
Platelets: 285 10*3/uL (ref 150–400)
RBC: 3.81 MIL/uL — AB (ref 3.87–5.11)
RDW: 12.5 % (ref 11.5–15.5)
WBC: 9.6 10*3/uL (ref 4.0–10.5)

## 2016-07-08 LAB — WET PREP, GENITAL
Clue Cells Wet Prep HPF POC: NONE SEEN
SPERM: NONE SEEN
Trich, Wet Prep: NONE SEEN
Yeast Wet Prep HPF POC: NONE SEEN

## 2016-07-08 LAB — AMNISURE RUPTURE OF MEMBRANE (ROM) NOT AT ARMC: AMNISURE: NEGATIVE

## 2016-07-08 NOTE — MAU Note (Signed)
Electronic signature not available. Pt signed and paper copy put in chart.

## 2016-07-08 NOTE — ED Notes (Signed)
[facility].

## 2016-07-08 NOTE — Discharge Instructions (Signed)
Oligohydramnios Oligohydramnios is a conditionin which there is not enough fluid in the sac (amniotic sac) that surrounds your unborn baby (fetus). The amniotic sac in the uterus contains fluid (amniotic fluid) that:  Protects your baby from injury (trauma) and infections.  Helps your baby move freely inside the uterus.  Helps your baby's lungs, kidneys, and digestive system to develop. This condition occurs before birth (is a prenatal condition), and it can interfere with your baby's normal prenatal growth and development. Oligohydramnios can happen any time during pregnancy, and it most commonly occurs during the last 3 months (third trimester). It is most likely to cause serious complications when it occurs early in pregnancy. Some possible complications of this condition include:  Early (premature) birth.  Birth defects.  Limited (restricted) fetal growth.  Decreased oxygen flow to the fetus due to pressure on the umbilical cord.  Pregnancy loss (miscarriage).  Stillbirth. What are the causes? This condition may be caused by:  A leak or tear in the amniotic sac.  A problem with the organ that nourishes the baby in the uterus (placenta), such as failure of the placenta to provide enough blood, fluid, and nutrients to the baby.  Having identical twins who share the same placenta.  A fetal birth defect. This is usually an abnormality in the fetal kidneys or urinary tract.  A pregnancy that goes past the due date.  A condition that the mother has, such as:  A lack of fluids in the body (dehydration).  High blood pressure.  Diabetes.  Reactions to certain medicines, such as ibuprofen or blood pressure medicines (ACE inhibitors).  Systemic lupus. In some cases, the cause is unknown. What increases the risk? This condition is more likely to develop if you:  Become dehydrated.  Have high blood pressure.  Take NSAIDs or ACE inhibitors.  Have diabetes or  lupus.  Have poor prenatal care. What are the signs or symptoms? In most cases, there are no symptoms of oligohydramnios. If you do have symptoms, they may include:  Fluid leaking from the vagina.  Having a uterus that is smaller than normal.  Feeling less movement of your baby in your uterus. How is this diagnosed? This condition may be diagnosed by measuring the amount of amniotic fluid in your amniotic sac using the amniotic fluid index (AFI). The AFI is a prenatal ultrasound test that uses painless, harmless sound waves to create an image of your uterus and your baby. An AFI prenatal ultrasound test may be done:  To measure the amniotic fluid level.  To check your baby's kidneys and your baby's growth.  To evaluate the placenta. You may also have other tests to find the cause of oligohydramnios. How is this treated? Treatment for this condition depends on how low your amniotic fluid level is, how far along you are in your pregnancy, and your overall health. Treatment may include:  Having your health care provider monitor your condition more closely than usual. You may have more frequent appointments and more AFI ultrasound measurements.  Increasing the amount of fluid in your body. This may be done by having you drink more fluids, or by giving you fluids through an IV tube that is inserted into one of your veins.  Injecting fluid into your amniotic sac during delivery (amnioinfusion).  Having your baby delivered early, if you are close to your due date. Follow these instructions at home: Lifestyle  Do not drink alcohol. No safe level of alcohol consumption during pregnancy has  been determined.  Do not use any tobacco products, such as cigarettes, chewing tobacco, and e-cigarettes. If you need help quitting, ask your health care provider.  Do not use any illegal drugs. These can harm your developing baby or cause a miscarriage. General instructions  Take over-the-counter  and prescription medicines only as told by your health care provider.  Follow instructions from your health care provider about physical activity and rest. Your health care provider may recommend that you stay in bed (be on bed rest).  Follow instructions from your health care provider about eating or drinking restrictions.  Eat healthy foods.  Drink enough fluids to keep your urine clear or pale yellow.  Keep all prenatal care appointments with your health care provider. This is important. Contact a health care provider if:  You notice that your baby seems to be moving less than usual. Get help right away if:  You have fluid leaking from your vagina.  You start to have labor pains (contractions). This may feel like a sense of tightening in your lower abdomen.  You have a fever. This information is not intended to replace advice given to you by your health care provider. Make sure you discuss any questions you have with your health care provider. Document Released: 10/26/2010 Document Revised: 12/14/2015 Document Reviewed: 01/21/2015 Elsevier Interactive Patient Education  2017 Elsevier Inc.  Pelvic Rest Introduction Pelvic rest may be recommended if:  Your placenta is partially or completely covering the opening of your cervix (placenta previa).  There is bleeding between the wall of the uterus and the amniotic sac in the first trimester of pregnancy (subchorionic hemorrhage).  You went into labor too early (preterm labor). Based on your overall health and the health of your baby, your health care provider will decide if pelvic rest is right for you. How do I rest my pelvis? For as long as told by your health care provider:  Do not have sex, sexual stimulation, or an orgasm.  Do not use tampons. Do not douche. Do not put anything in your vagina.  Do not lift anything that is heavier than 10 lb (4.5 kg).  Avoid activities that take a lot of effort (are  strenuous).  Avoid any activity in which your pelvic muscles could become strained. When should I seek medical care? Seek medical care if you have:  Cramping pain in your lower abdomen.  Vaginal discharge.  A low, dull backache.  Regular contractions.  Uterine tightening. When should I seek immediate medical care? Seek immediate medical care if:  You have vaginal bleeding and you are pregnant. This information is not intended to replace advice given to you by your health care provider. Make sure you discuss any questions you have with your health care provider. Document Released: 11/05/2010 Document Revised: 12/17/2015 Document Reviewed: 01/12/2015  2017 Elsevier

## 2016-07-08 NOTE — MAU Provider Note (Signed)
History     CSN: [id number]  Arrival date and time: 07/08/16 1633   First Provider Initiated Contact with Patient 07/08/16 1700     Chief Complaint  Patient presents with  . Vaginal Discharge  . oligohydramnios   HPI Robin [name] is a 30 y.o. [id number] at 3356w6d who presents from MFM for possible PPROM. Was seen at MFM today for anatomy scan & found to have oligohydramnios with an AFI of 3 & [facility]. Patient reports leaking fluid intermittently for the last few weeks. Leaking normally occurs at night but doesn't happen daily. Reports large amount of brown watery fluid. Denies bright red bleeding. No fever/chills or abdominal pain.   OB History    Gravida Para Term Preterm AB Living   5 3 3   1 3    SAB TAB Ectopic Multiple Live Births   1       3      Past Medical History:  Diagnosis Date  . Abnormal Pap smear 2008   HAD HPV;HAD GENITAL WART REMOVED;LAST PAP 07/2010 WAS NORMAL  . Anemia    FeSO4 SUPP TAKEN IN PAST  . Asthma    AS A CHILD;GREW OUT OF @ 8 YOA  . Depression   . History of smoking 12/26/2011   Reports quit '12  . Infection    UTI;CAN GET FREQ    Past Surgical History:  Procedure Laterality Date  . NASAL SINUS SURGERY  2016    Family History  Problem Relation Age of Onset  . Mitral valve prolapse Mother     DIED @ 3930 YOA OF CONDITION  . Mitral valve prolapse Paternal Aunt   . Heart attack Paternal Grandfather     DECEASED  . Dementia Maternal Grandmother     Social History  Substance Use Topics  . Smoking status: Former Smoker    Types: Cigarettes    Quit date: 06/27/2011  . Smokeless tobacco: Never Used  . Alcohol use No     Comment: BI-WEEKLY PRIOR TO PREGNANCY    Allergies: No Known Allergies  Prescriptions Prior to Admission  Medication Sig Dispense Refill Last Dose  . Doxylamine-Pyridoxine (DICLEGIS) 10-10 MG TBEC Take 1 tablet by mouth at bedtime. 30 tablet 3   . metroNIDAZOLE (FLAGYL) 500 MG tablet Take 1 tablet (500 mg total) by  mouth 2 (two) times daily. 14 tablet 0   . Prenatal Vit-Fe Fumarate-FA (PRENATAL VITAMINS) 28-0.8 MG TABS Take by mouth.       Review of Systems  Constitutional: Negative.   Gastrointestinal: Negative.   Genitourinary: Negative for dysuria.       + vaginal discharge vs LOF   Physical Exam   Blood pressure 112/64, pulse 76, temperature 98.4 F (36.9 C), resp. rate 18, height 5\' 6"  (1.676 m), weight 114 lb (51.7 kg), last menstrual period 02/23/2015, currently breastfeeding.  Physical Exam  Nursing note and vitals reviewed. Constitutional: She is oriented to person, place, and time. She appears well-developed and well-nourished. She appears distressed (pt tearful).  HENT:  Head: Normocephalic and atraumatic.  Eyes: Conjunctivae are normal. Right eye exhibits no discharge. Left eye exhibits no discharge. No scleral icterus.  Neck: Normal range of motion.  Cardiovascular: Normal rate.   Respiratory: Effort normal. No respiratory distress.  GI: Soft. There is no tenderness.  Genitourinary: No bleeding in the vagina. Vaginal discharge (small amount of tan mucoid discharge; no pooling) found.  Genitourinary Comments: Cervix visually closed  Neurological: She is alert and oriented  to person, place, and time.  Skin: Skin is warm and dry. She is not diaphoretic.  Psychiatric: She has a normal mood and affect. Her behavior is normal. Judgment and thought content normal.    MAU Course  Procedures Results for orders placed or performed during the hospital encounter of 07/08/16 (from the past 24 hour(s))  Wet prep, genital     Status: Abnormal   Collection Time: 07/08/16  5:37 PM  Result Value Ref Range   Yeast Wet Prep HPF POC NONE SEEN NONE SEEN   Trich, Wet Prep NONE SEEN NONE SEEN   Clue Cells Wet Prep HPF POC NONE SEEN NONE SEEN   WBC, Wet Prep HPF POC FEW (A) NONE SEEN   Sperm NONE SEEN   Amnisure rupture of membrane (rom)[facility]     Status: None   Collection Time: 07/08/16   5:38 PM  Result Value Ref Range   Amnisure ROM NEGATIVE   CBC with Differential     Status: Abnormal   Collection Time: 07/08/16  5:48 PM  Result Value Ref Range   WBC 9.6 4.0 - 10.5 K/uL   RBC 3.81 (L) 3.87 - 5.11 MIL/uL   Hemoglobin 11.7 (L) 12.0 - 15.0 g/dL   HCT 16.133.9 (L) 09.636.0 - 04.546.0 %   MCV 89.0 78.0 - 100.0 fL   MCH 30.7 26.0 - 34.0 pg   MCHC 34.5 30.0 - 36.0 g/dL   RDW 40.912.5 81.111.5 - 91.415.5 %   Platelets 285 150 - 400 K/uL   Neutrophils Relative % 75 %   Neutro Abs 7.2 1.7 - 7.7 K/uL   Lymphocytes Relative 18 %   Lymphs Abs 1.7 0.7 - 4.0 K/uL   Monocytes Relative 5 %   Monocytes Absolute 0.5 0.1 - 1.0 K/uL   Eosinophils Relative 2 %   Eosinophils Absolute 0.2 0.0 - 0.7 K/uL   Basophils Relative 0 %   Basophils Absolute 0.0 0.0 - 0.1 K/uL    MDM FHT 146 by doppler CBC, GC/CT, wet prep, amnisure No pooling; amnisure negative No leukocytosis & pt is afebrile Dr. Emelda FearFerguson on unit to speak with patient regarding resutls & POC Assessment and Plan  A: 1. Oligohydramnios in second trimester, single or unspecified fetus   2. Vaginal discharge during pregnancy in second trimester   3. Subchorionic hematoma in second trimester, single or unspecified fetus    P: Discharge home Pelvic rest F/u ultrasound in 2 weeks -- ordered Keep f/u with OB -- call sooner if leaking continues Discussed reasons to return to MAU  Robin [name] 07/08/2016, 4:52 PM

## 2016-07-08 NOTE — MAU Note (Addendum)
Pt sent from MFM for PROM

## 2016-07-11 LAB — GC/CHLAMYDIA PROBE AMP (~~LOC~~) NOT AT ARMC
CHLAMYDIA, DNA PROBE: NEGATIVE
NEISSERIA GONORRHEA: NEGATIVE

## 2016-07-20 ENCOUNTER — Ambulatory Visit (INDEPENDENT_AMBULATORY_CARE_PROVIDER_SITE_OTHER): Payer: Managed Care, Other (non HMO) | Admitting: Obstetrics & Gynecology

## 2016-07-20 VITALS — BP 101/69 | HR 109 | Wt 111.0 lb

## 2016-07-20 DIAGNOSIS — Z3480 Encounter for supervision of other normal pregnancy, unspecified trimester: Secondary | ICD-10-CM

## 2016-07-20 DIAGNOSIS — O99820 Streptococcus B carrier state complicating pregnancy: Secondary | ICD-10-CM

## 2016-07-20 DIAGNOSIS — O4102X1 Oligohydramnios, second trimester, fetus 1: Secondary | ICD-10-CM

## 2016-07-20 NOTE — Progress Notes (Signed)
   PRENATAL VISIT NOTE  Subjective:  Robin [name] is a 30 y.o. [phone] at 3554w6d being seen today for ongoing prenatal care.  She is currently monitored for the following issues for this high-risk pregnancy and has Family hx of hydrocephalus; Family hx of bicuspid aortic valve--FOB; History of abnormal Pap smear; history of genital warts; History of smoking; Supervision of other normal pregnancy, antepartum; GBS (group B Streptococcus carrier), +RV culture, currently pregnant; and Oligohydramnios antepartum, second trimester, fetus 1 on her problem list.  Patient reports no complaints.   .  .   . Denies leaking of fluid.   The following portions of the patient's history were reviewed and updated as appropriate: allergies, current medications, past family history, past medical history, past social history, past surgical history and problem list. Problem list updated.  Objective:  There were no vitals filed for this visit.  Fetal Status:           General:  Alert, oriented and cooperative. Patient is in no acute distress.  Skin: Skin is warm and dry. No rash noted.   Cardiovascular: Normal heart rate noted  Respiratory: Normal respiratory effort, no problems with respiration noted  Abdomen: Soft, gravid, appropriate for gestational age.       Pelvic:  Cervical exam deferred        Extremities: Normal range of motion.     Mental Status: Normal mood and affect. Normal behavior. Normal judgment and thought content.   Lungs- CTAB (she has a cough) Assessment and Plan:  Pregnancy: [id number] at 9054w6d  1. Supervision of other normal pregnancy, antepartum    3. Oligohydramnios antepartum, second trimester, fetus 1 - She has a follow up u/s this week with MFM. She denies any leaking, fevers, pain, or contractions  Preterm labor symptoms and general obstetric precautions including but not limited to vaginal bleeding, contractions, leaking of fluid and fetal movement were reviewed in detail with  the patient. Please refer to After Visit Summary for other counseling recommendations.  No Follow-up on file.   Allie BossierMyra C Malaysha Arlen, MD

## 2016-07-22 ENCOUNTER — Ambulatory Visit (HOSPITAL_COMMUNITY)
Admission: RE | Admit: 2016-07-22 | Discharge: 2016-07-22 | Disposition: A | Discharge status: Home / Self Care | Payer: Medicaid Other | Source: Ambulatory Visit | Attending: Student | Admitting: Student

## 2016-07-22 ENCOUNTER — Other Ambulatory Visit (HOSPITAL_COMMUNITY): Payer: Self-pay | Admitting: Student

## 2016-07-22 DIAGNOSIS — O4592 Premature separation of placenta, unspecified, second trimester: Secondary | ICD-10-CM | POA: Diagnosis not present

## 2016-07-22 DIAGNOSIS — O4102X0 Oligohydramnios, second trimester, not applicable or unspecified: Secondary | ICD-10-CM | POA: Diagnosis present

## 2016-07-22 DIAGNOSIS — Z3A20 20 weeks gestation of pregnancy: Secondary | ICD-10-CM | POA: Insufficient documentation

## 2016-07-26 ENCOUNTER — Other Ambulatory Visit (HOSPITAL_COMMUNITY): Payer: Self-pay | Admitting: *Deleted

## 2016-07-26 DIAGNOSIS — O4102X0 Oligohydramnios, second trimester, not applicable or unspecified: Secondary | ICD-10-CM

## 2016-08-01 ENCOUNTER — Inpatient Hospital Stay (HOSPITAL_COMMUNITY)
Admission: AD | Admit: 2016-08-01 | Discharge: 2016-08-01 | Disposition: A | Discharge status: Home / Self Care | Payer: Managed Care, Other (non HMO) | Source: Ambulatory Visit | Attending: Family Medicine | Admitting: Family Medicine

## 2016-08-01 ENCOUNTER — Ambulatory Visit (INDEPENDENT_AMBULATORY_CARE_PROVIDER_SITE_OTHER): Payer: Managed Care, Other (non HMO) | Admitting: Advanced Practice Midwife

## 2016-08-01 ENCOUNTER — Encounter (HOSPITAL_COMMUNITY): Payer: Self-pay

## 2016-08-01 VITALS — BP 92/60 | HR 97 | Wt 114.0 lb

## 2016-08-01 DIAGNOSIS — Z3482 Encounter for supervision of other normal pregnancy, second trimester: Secondary | ICD-10-CM

## 2016-08-01 DIAGNOSIS — O4102X1 Oligohydramnios, second trimester, fetus 1: Secondary | ICD-10-CM

## 2016-08-01 DIAGNOSIS — Z3483 Encounter for supervision of other normal pregnancy, third trimester: Secondary | ICD-10-CM | POA: Diagnosis present

## 2016-08-01 DIAGNOSIS — O26892 Other specified pregnancy related conditions, second trimester: Secondary | ICD-10-CM

## 2016-08-01 DIAGNOSIS — Z3480 Encounter for supervision of other normal pregnancy, unspecified trimester: Secondary | ICD-10-CM

## 2016-08-01 DIAGNOSIS — Z3A22 22 weeks gestation of pregnancy: Secondary | ICD-10-CM

## 2016-08-01 DIAGNOSIS — Z3A23 23 weeks gestation of pregnancy: Secondary | ICD-10-CM | POA: Insufficient documentation

## 2016-08-01 DIAGNOSIS — O99820 Streptococcus B carrier state complicating pregnancy: Secondary | ICD-10-CM

## 2016-08-01 DIAGNOSIS — R109 Unspecified abdominal pain: Secondary | ICD-10-CM

## 2016-08-01 LAB — WET PREP, GENITAL
CLUE CELLS WET PREP: NONE SEEN
SPERM: NONE SEEN
Trich, Wet Prep: NONE SEEN
Yeast Wet Prep HPF POC: NONE SEEN

## 2016-08-01 LAB — AMNISURE RUPTURE OF MEMBRANE (ROM) NOT AT ARMC: AMNISURE: NEGATIVE

## 2016-08-01 NOTE — Progress Notes (Signed)
   PRENATAL VISIT NOTE  Subjective:  Robin [name] is a 31 y.o. [phone] at 6956w4d being seen today for ongoing prenatal care.  She is currently monitored for the following issues for this high-risk pregnancy and has Family hx of hydrocephalus; Family hx of bicuspid aortic valve--FOB; History of abnormal Pap smear; history of genital warts; History of smoking; Supervision of other normal pregnancy, antepartum; GBS (group B Streptococcus carrier), +RV culture, currently pregnant; and Oligohydramnios antepartum, second trimester, fetus 1 on her problem list.  Patient reports Leaking large amounts of brown water every night for the past 4 nights. Does not leak during day. Has pictures of brown water on floor and filling a large pad.   . Vag. Bleeding: None.  Movement: Present. Denies leaking of fluid.   The following portions of the patient's history were reviewed and updated as appropriate: allergies, current medications, past family history, past medical history, past social history, past surgical history and problem list. Problem list updated.  Objective:   Vitals:   08/01/16 0959  BP: 92/60  Pulse: 97  Weight: 114 lb (51.7 kg)    Fetal Status:     Movement: Present     General:  Alert, oriented and cooperative. Patient is in no acute distress.  Skin: Skin is warm and dry. No rash noted.   Cardiovascular: Normal heart rate noted  Respiratory: Normal respiratory effort, no problems with respiration noted  Abdomen: Soft, gravid, appropriate for gestational age. Pain/Pressure: Absent     Pelvic:  Cervical exam deferred        Sterile Speculum exam done. No pooling seen, No ferning. But there is not much fluid in vault. Did NOT do digital exam due to possible PPROM.   Extremities: Normal range of motion.  Edema: None  Mental Status: Normal mood and affect. Normal behavior. Normal judgment and thought content.   Assessment and Plan:  Pregnancy: G5P3013 at 6656w4d  1. Supervision of other  normal pregnancy, antepartum       [facility] to history and pictures of large amount of fluid (did not appear to be urine)       Has known large subchorionic hemorrhage. AFI went from 3 to 7 on last US 12/29 - Fetal fibronectin  Preterm labor symptoms and general obstetric precautions including but not limited to vaginal bleeding, contractions, leaking of fluid and fetal movement were reviewed in detail with the patient. Please refer to After Visit Summary for other counseling recommendations.  Has MFM appt Friday  Aviva SignsMarie L Gio Janoski, CNM

## 2016-08-01 NOTE — MAU Note (Signed)
[place] [facility], has been ? Leaking for the past 4 days, leaks more at night, brownish/reddish fluid.   "Looks like muddy water." Sent to MAU for [facility].  C/O mild back pain & lower abd pain, no bright red bleeding.

## 2016-08-01 NOTE — Patient Instructions (Signed)
Second Trimester of Pregnancy The second trimester is from week 13 through week 28, month 4 through 6. This is often the time in pregnancy that you feel your best. Often times, morning sickness has lessened or quit. You may have more energy, and you may get hungry more often. Your unborn baby (fetus) is growing rapidly. At the end of the sixth month, he or she is about 9 inches long and weighs about 1 pounds. You will likely feel the baby move (quickening) between 18 and 20 weeks of pregnancy. Follow these instructions at home:  Avoid all smoking, herbs, and alcohol. Avoid drugs not approved by your doctor.  Do not use any tobacco products, including cigarettes, chewing tobacco, and electronic cigarettes. If you need help quitting, ask your doctor. You may get counseling or other support to help you quit.  Only take medicine as told by your doctor. Some medicines are safe and some are not during pregnancy.  Exercise only as told by your doctor. Stop exercising if you start having cramps.  Eat regular, healthy meals.  Wear a good support bra if your breasts are tender.  Do not use hot tubs, steam rooms, or saunas.  Wear your seat belt when driving.  Avoid raw meat, uncooked cheese, and liter boxes and soil used by cats.  Take your prenatal vitamins.  Take 1500-2000 milligrams of calcium daily starting at the 20th week of pregnancy until you deliver your baby.  Try taking medicine that helps you poop (stool softener) as needed, and if your doctor approves. Eat more fiber by eating fresh fruit, vegetables, and whole grains. Drink enough fluids to keep your pee (urine) clear or pale yellow.  Take warm water baths (sitz baths) to soothe pain or discomfort caused by hemorrhoids. Use hemorrhoid cream if your doctor approves.  If you have puffy, bulging veins (varicose veins), wear support hose. Raise (elevate) your feet for 15 minutes, 3-4 times a day. Limit salt in your diet.  Avoid heavy  lifting, wear low heals, and sit up straight.  Rest with your legs raised if you have leg cramps or low back pain.  Visit your dentist if you have not gone during your pregnancy. Use a soft toothbrush to brush your teeth. Be gentle when you floss.  You can have sex (intercourse) unless your doctor tells you not to.  Go to your doctor visits. Get help if:  You feel dizzy.  You have mild cramps or pressure in your lower belly (abdomen).  You have a nagging pain in your belly area.  You continue to feel sick to your stomach (nauseous), throw up (vomit), or have watery poop (diarrhea).  You have bad smelling fluid coming from your vagina.  You have pain with peeing (urination). Get help right away if:  You have a fever.  You are leaking fluid from your vagina.  You have spotting or bleeding from your vagina.  You have severe belly cramping or pain.  You lose or gain weight rapidly.  You have trouble catching your breath and have chest pain.  You notice sudden or extreme puffiness (swelling) of your face, hands, ankles, feet, or legs.  You have not felt the baby move in over an hour.  You have severe headaches that do not go away with medicine.  You have vision changes. This information is not intended to replace advice given to you by your health care provider. Make sure you discuss any questions you have with your health care   provider. Document Released: 10/05/2009 Document Revised: 12/17/2015 Document Reviewed: 09/11/2012 Elsevier Interactive Patient Education  2017 Elsevier Inc.  

## 2016-08-01 NOTE — Discharge Instructions (Signed)
Oligohydramnios Oligohydramnios is a conditionin which there is not enough fluid in the sac (amniotic sac) that surrounds your unborn baby (fetus). The amniotic sac in the uterus contains fluid (amniotic fluid) that:  Protects your baby from injury (trauma) and infections.  Helps your baby move freely inside the uterus.  Helps your baby's lungs, kidneys, and digestive system to develop. This condition occurs before birth (is a prenatal condition), and it can interfere with your baby's normal prenatal growth and development. Oligohydramnios can happen any time during pregnancy, and it most commonly occurs during the last 3 months (third trimester). It is most likely to cause serious complications when it occurs early in pregnancy. Some possible complications of this condition include:  Early (premature) birth.  Birth defects.  Limited (restricted) fetal growth.  Decreased oxygen flow to the fetus due to pressure on the umbilical cord.  Pregnancy loss (miscarriage).  Stillbirth. What are the causes? This condition may be caused by:  A leak or tear in the amniotic sac.  A problem with the organ that nourishes the baby in the uterus (placenta), such as failure of the placenta to provide enough blood, fluid, and nutrients to the baby.  Having identical twins who share the same placenta.  A fetal birth defect. This is usually an abnormality in the fetal kidneys or urinary tract.  A pregnancy that goes past the due date.  A condition that the mother has, such as:  A lack of fluids in the body (dehydration).  High blood pressure.  Diabetes.  Reactions to certain medicines, such as ibuprofen or blood pressure medicines (ACE inhibitors).  Systemic lupus. In some cases, the cause is unknown. What increases the risk? This condition is more likely to develop if you:  Become dehydrated.  Have high blood pressure.  Take NSAIDs or ACE inhibitors.  Have diabetes or  lupus.  Have poor prenatal care. What are the signs or symptoms? In most cases, there are no symptoms of oligohydramnios. If you do have symptoms, they may include:  Fluid leaking from the vagina.  Having a uterus that is smaller than normal.  Feeling less movement of your baby in your uterus. How is this diagnosed? This condition may be diagnosed by measuring the amount of amniotic fluid in your amniotic sac using the amniotic fluid index (AFI). The AFI is a prenatal ultrasound test that uses painless, harmless sound waves to create an image of your uterus and your baby. An AFI prenatal ultrasound test may be done:  To measure the amniotic fluid level.  To check your baby's kidneys and your baby's growth.  To evaluate the placenta. You may also have other tests to find the cause of oligohydramnios. How is this treated? Treatment for this condition depends on how low your amniotic fluid level is, how far along you are in your pregnancy, and your overall health. Treatment may include:  Having your health care provider monitor your condition more closely than usual. You may have more frequent appointments and more AFI ultrasound measurements.  Increasing the amount of fluid in your body. This may be done by having you drink more fluids, or by giving you fluids through an IV tube that is inserted into one of your veins.  Injecting fluid into your amniotic sac during delivery (amnioinfusion).  Having your baby delivered early, if you are close to your due date. Follow these instructions at home: Lifestyle  Do not drink alcohol. No safe level of alcohol consumption during pregnancy has  been determined.  Do not use any tobacco products, such as cigarettes, chewing tobacco, and e-cigarettes. If you need help quitting, ask your health care provider.  Do not use any illegal drugs. These can harm your developing baby or cause a miscarriage. General instructions  Take over-the-counter  and prescription medicines only as told by your health care provider.  Follow instructions from your health care provider about physical activity and rest. Your health care provider may recommend that you stay in bed (be on bed rest).  Follow instructions from your health care provider about eating or drinking restrictions.  Eat healthy foods.  Drink enough fluids to keep your urine clear or pale yellow.  Keep all prenatal care appointments with your health care provider. This is important. Contact a health care provider if:  You notice that your baby seems to be moving less than usual. Get help right away if:  You have fluid leaking from your vagina.  You start to have labor pains (contractions). This may feel like a sense of tightening in your lower abdomen.  You have a fever. This information is not intended to replace advice given to you by your health care provider. Make sure you discuss any questions you have with your health care provider. Document Released: 10/26/2010 Document Revised: 12/14/2015 Document Reviewed: 01/21/2015 Elsevier Interactive Patient Education  2017 [employer]

## 2016-08-01 NOTE — Progress Notes (Signed)
Feels like she is leaking fluid.

## 2016-08-02 LAB — FETAL FIBRONECTIN: Fetal Fibronectin: POSITIVE — AB

## 2016-08-03 ENCOUNTER — Encounter (HOSPITAL_COMMUNITY): Payer: Self-pay

## 2016-08-03 ENCOUNTER — Inpatient Hospital Stay (HOSPITAL_COMMUNITY): Payer: 59

## 2016-08-03 ENCOUNTER — Inpatient Hospital Stay (HOSPITAL_COMMUNITY)
Admission: AD | Admit: 2016-08-03 | Discharge: 2016-09-11 | DRG: 765 | Disposition: A | Discharge status: Home / Self Care | Payer: 59 | Source: Ambulatory Visit | Attending: Obstetrics & Gynecology | Admitting: Obstetrics & Gynecology

## 2016-08-03 DIAGNOSIS — O42912 Preterm premature rupture of membranes, unspecified as to length of time between rupture and onset of labor, second trimester: Secondary | ICD-10-CM | POA: Diagnosis present

## 2016-08-03 DIAGNOSIS — O411230 Chorioamnionitis, third trimester, not applicable or unspecified: Secondary | ICD-10-CM | POA: Diagnosis present

## 2016-08-03 DIAGNOSIS — Z87891 Personal history of nicotine dependence: Secondary | ICD-10-CM

## 2016-08-03 DIAGNOSIS — O99820 Streptococcus B carrier state complicating pregnancy: Secondary | ICD-10-CM

## 2016-08-03 DIAGNOSIS — O4592 Premature separation of placenta, unspecified, second trimester: Secondary | ICD-10-CM | POA: Diagnosis not present

## 2016-08-03 DIAGNOSIS — O4102X1 Oligohydramnios, second trimester, fetus 1: Secondary | ICD-10-CM

## 2016-08-03 DIAGNOSIS — O9882 Other maternal infectious and parasitic diseases complicating childbirth: Secondary | ICD-10-CM | POA: Diagnosis present

## 2016-08-03 DIAGNOSIS — M9903 Segmental and somatic dysfunction of lumbar region: Secondary | ICD-10-CM | POA: Diagnosis present

## 2016-08-03 DIAGNOSIS — O4692 Antepartum hemorrhage, unspecified, second trimester: Secondary | ICD-10-CM

## 2016-08-03 DIAGNOSIS — Z3A24 24 weeks gestation of pregnancy: Secondary | ICD-10-CM | POA: Diagnosis not present

## 2016-08-03 DIAGNOSIS — N939 Abnormal uterine and vaginal bleeding, unspecified: Secondary | ICD-10-CM

## 2016-08-03 DIAGNOSIS — Z3A22 22 weeks gestation of pregnancy: Secondary | ICD-10-CM | POA: Diagnosis not present

## 2016-08-03 DIAGNOSIS — Z3A38 38 weeks gestation of pregnancy: Secondary | ICD-10-CM | POA: Diagnosis not present

## 2016-08-03 DIAGNOSIS — O4693 Antepartum hemorrhage, unspecified, third trimester: Secondary | ICD-10-CM

## 2016-08-03 DIAGNOSIS — O4593 Premature separation of placenta, unspecified, third trimester: Secondary | ICD-10-CM | POA: Diagnosis not present

## 2016-08-03 DIAGNOSIS — O26852 Spotting complicating pregnancy, second trimester: Secondary | ICD-10-CM | POA: Diagnosis not present

## 2016-08-03 DIAGNOSIS — O42919 Preterm premature rupture of membranes, unspecified as to length of time between rupture and onset of labor, unspecified trimester: Secondary | ICD-10-CM | POA: Diagnosis present

## 2016-08-03 DIAGNOSIS — O288 Other abnormal findings on antenatal screening of mother: Secondary | ICD-10-CM

## 2016-08-03 DIAGNOSIS — Z8249 Family history of ischemic heart disease and other diseases of the circulatory system: Secondary | ICD-10-CM | POA: Diagnosis not present

## 2016-08-03 DIAGNOSIS — Z3A25 25 weeks gestation of pregnancy: Secondary | ICD-10-CM | POA: Diagnosis not present

## 2016-08-03 DIAGNOSIS — R300 Dysuria: Secondary | ICD-10-CM | POA: Diagnosis present

## 2016-08-03 DIAGNOSIS — O42113 Preterm premature rupture of membranes, onset of labor more than 24 hours following rupture, third trimester: Secondary | ICD-10-CM | POA: Diagnosis not present

## 2016-08-03 DIAGNOSIS — Z3A26 26 weeks gestation of pregnancy: Secondary | ICD-10-CM | POA: Diagnosis not present

## 2016-08-03 DIAGNOSIS — O99824 Streptococcus B carrier state complicating childbirth: Secondary | ICD-10-CM | POA: Diagnosis present

## 2016-08-03 DIAGNOSIS — Z3A27 27 weeks gestation of pregnancy: Secondary | ICD-10-CM | POA: Diagnosis not present

## 2016-08-03 DIAGNOSIS — O4290 Premature rupture of membranes, unspecified as to length of time between rupture and onset of labor, unspecified weeks of gestation: Secondary | ICD-10-CM | POA: Diagnosis present

## 2016-08-03 DIAGNOSIS — Z3A23 23 weeks gestation of pregnancy: Secondary | ICD-10-CM | POA: Diagnosis not present

## 2016-08-03 DIAGNOSIS — Z362 Encounter for other antenatal screening follow-up: Secondary | ICD-10-CM | POA: Diagnosis not present

## 2016-08-03 DIAGNOSIS — O88112 Amniotic fluid embolism in pregnancy, second trimester: Secondary | ICD-10-CM | POA: Diagnosis not present

## 2016-08-03 DIAGNOSIS — B373 Candidiasis of vulva and vagina: Secondary | ICD-10-CM | POA: Diagnosis present

## 2016-08-03 LAB — CBC
HEMATOCRIT: 32.5 % — AB (ref 36.0–46.0)
Hemoglobin: 11.2 g/dL — ABNORMAL LOW (ref 12.0–15.0)
MCH: 30.5 pg (ref 26.0–34.0)
MCHC: 34.5 g/dL (ref 30.0–36.0)
MCV: 88.6 fL (ref 78.0–100.0)
PLATELETS: 335 10*3/uL (ref 150–400)
RBC: 3.67 MIL/uL — ABNORMAL LOW (ref 3.87–5.11)
RDW: 12.5 % (ref 11.5–15.5)
WBC: 10.3 10*3/uL (ref 4.0–10.5)

## 2016-08-03 LAB — TYPE AND SCREEN
ABO/RH(D): O POS
Antibody Screen: NEGATIVE

## 2016-08-03 LAB — URINALYSIS, ROUTINE W REFLEX MICROSCOPIC
BILIRUBIN URINE: NEGATIVE
Bacteria, UA: NONE SEEN
Glucose, UA: NEGATIVE mg/dL
KETONES UR: NEGATIVE mg/dL
LEUKOCYTES UA: NEGATIVE
NITRITE: NEGATIVE
PH: 5 (ref 5.0–8.0)
Protein, ur: NEGATIVE mg/dL
SPECIFIC GRAVITY, URINE: 1.019 (ref 1.005–1.030)

## 2016-08-03 LAB — ABO/RH: ABO/RH(D): O POS

## 2016-08-03 LAB — OB RESULTS CONSOLE GBS: GBS: POSITIVE

## 2016-08-03 MED ORDER — DOCUSATE SODIUM 100 MG PO CAPS
100.0000 mg | ORAL_CAPSULE | Freq: Every day | ORAL | Status: DC
  Administered 2016-08-04 – 2016-09-09 (×35): 100 mg via ORAL
  Filled 2016-08-03 (×36): qty 1

## 2016-08-03 MED ORDER — BETAMETHASONE SOD PHOS & ACET 6 (3-3) MG/ML IJ SUSP
12.0000 mg | INTRAMUSCULAR | Status: AC
  Administered 2016-08-03 – 2016-08-04 (×2): 12 mg via INTRAMUSCULAR
  Filled 2016-08-03 (×2): qty 2

## 2016-08-03 MED ORDER — CALCIUM CARBONATE ANTACID 500 MG PO CHEW: 2.0000 | CHEWABLE_TABLET | ORAL | Status: DC | PRN

## 2016-08-03 MED ORDER — PRENATAL MULTIVITAMIN CH
1.0000 | ORAL_TABLET | Freq: Every day | ORAL | Status: DC
  Administered 2016-08-04: 1 via ORAL
  Filled 2016-08-03: qty 1

## 2016-08-03 MED ORDER — SODIUM CHLORIDE 0.9 % IV SOLN
2.0000 g | Freq: Four times a day (QID) | INTRAVENOUS | Status: AC
  Administered 2016-08-03 – 2016-08-05 (×8): 2 g via INTRAVENOUS
  Filled 2016-08-03 (×8): qty 2000

## 2016-08-03 MED ORDER — ZOLPIDEM TARTRATE 5 MG PO TABS: 5.0000 mg | ORAL_TABLET | Freq: Every evening | ORAL | Status: DC | PRN

## 2016-08-03 MED ORDER — AZITHROMYCIN 250 MG PO TABS
1000.0000 mg | ORAL_TABLET | Freq: Every day | ORAL | Status: DC
  Administered 2016-08-03 – 2016-08-05 (×3): 1000 mg via ORAL
  Filled 2016-08-03 (×3): qty 4

## 2016-08-03 MED ORDER — ACETAMINOPHEN 325 MG PO TABS
650.0000 mg | ORAL_TABLET | ORAL | Status: DC | PRN
  Administered 2016-08-13 – 2016-09-09 (×6): 650 mg via ORAL
  Filled 2016-08-03 (×6): qty 2

## 2016-08-03 MED ORDER — AMOXICILLIN 500 MG PO CAPS
500.0000 mg | ORAL_CAPSULE | Freq: Three times a day (TID) | ORAL | Status: AC
  Administered 2016-08-05 – 2016-08-10 (×15): 500 mg via ORAL
  Filled 2016-08-03 (×15): qty 1

## 2016-08-03 NOTE — H&P (Signed)
ANTEPARTUM ADMISSION HISTORY AND PHYSICAL NOTE   History of Present Illness: Robin [name] is a 31 y.o. [id number] at 2116w6d admitted for rupture of membranes.  Patient has been seen in MAU several times for concern for rupture and has been followed by MFM for olidohydramnios. She has negative amnisure 2 days ago. She reports this AM she sneezed and and a large gush of pinkish fluid. She has continue to have spotting since.  Patient reports the fetal movement as active. Patient reports uterine contraction  activity as none. Patient reports  vaginal bleeding as spotting. Patient describes fluid per vagina as Clear/pink Fetal presentation is cephalic.  Patient Active Problem List   Diagnosis Date Noted  . Preterm premature rupture of membranes (PPROM) with unknown onset of labor 08/03/2016  . Oligohydramnios antepartum, second trimester, fetus 1 07/20/2016  . GBS (group B Streptococcus carrier), +RV culture, currently pregnant 05/12/2016  . Supervision of other normal pregnancy, antepartum 05/25/2015  . Family hx of hydrocephalus 04/03/2012  . Family hx of bicuspid aortic valve--FOB 04/03/2012  . History of abnormal Pap smear 01/02/2012  . history of genital warts 12/26/2011  . History of smoking 12/26/2011    Past Medical History:  Diagnosis Date  . Abnormal Pap smear 2008   HAD HPV;HAD GENITAL WART REMOVED;LAST PAP 07/2010 WAS NORMAL  . Anemia    FeSO4 SUPP TAKEN IN PAST  . Asthma    AS A CHILD;GREW OUT OF @ 8 YOA  . Depression   . History of smoking 12/26/2011   Reports quit '12  . Infection    UTI;CAN GET FREQ    Past Surgical History:  Procedure Laterality Date  . NASAL SINUS SURGERY  2016    OB History  Gravida Para Term Preterm AB Living  5 3 3   1 3   SAB TAB Ectopic Multiple Live Births  1       3    # Outcome Date GA Lbr Len/2nd Weight Sex Delivery Anes PTL Lv  5 Current           4 Term 12/08/15 10877w1d   F      3 SAB 01/31/14 3285w2d    SAB   FD  2 Term 08/09/12  5848w0d  7 lb 6.5 oz (3.36 kg) F Vag-Spont   LIV     Birth Comments: BORN EN ROUTE TO WH  1 Term 07/16/10 4351w3d 12:00 7 lb 7 oz (3.374 kg) M Vag-Spont EPI Y LIV     Birth Comments: No complications      Social History   Social History  . Marital status: Married    Spouse name: [place]HAD Brim  . Number of children: 1  . Years of education: N/A   Occupational History  . SHIFT SUPERVISOR [employer]   Social History Main Topics  . Smoking status: Former Smoker    Types: Cigarettes    Quit date: 06/27/2011  . Smokeless tobacco: Never Used  . Alcohol use No     Comment: BI-WEEKLY PRIOR TO PREGNANCY  . Drug use: No  . Sexual activity: Yes    Partners: Male    Birth control/ protection: None   Other Topics Concern  . None   Social History Narrative  . None    Family History  Problem Relation Age of Onset  . Mitral valve prolapse Mother     DIED @ 7630 YOA OF CONDITION  . Mitral valve prolapse Paternal Aunt   . Heart attack Paternal  Grandfather     DECEASED  . Dementia Maternal Grandmother     No Known Allergies  Prescriptions Prior to Admission  Medication Sig Dispense Refill Last Dose  . Doxylamine-Pyridoxine (DICLEGIS) 10-10 MG TBEC Take 1 tablet by mouth at bedtime. 30 tablet 3 Past Week at Unknown time  . Prenatal Vit-Fe Fumarate-FA (PRENATAL VITAMINS) 28-0.8 MG TABS Take 1 tablet by mouth daily.    08/02/2016 at Unknown time    Review of Systems  Constitutional: Negative for chills and fever.  Respiratory: Negative for cough, sputum production and shortness of breath.   Cardiovascular: Negative for chest pain and leg swelling.  Gastrointestinal: Negative for abdominal pain, constipation, diarrhea, heartburn, nausea and vomiting.  Genitourinary: Negative for dysuria, frequency and urgency.  Musculoskeletal: Negative for back pain, myalgias and neck pain.  Skin: Negative for itching and rash.  Neurological: Negative for dizziness, tingling, tremors and headaches.   Endo/Heme/Allergies: Negative for environmental allergies. Does not bruise/bleed easily.     Vitals:  BP 107/60 (BP Location: Right Arm)   Pulse 96   Temp 98.1 F (36.7 C) (Oral)   Resp 20   LMP 02/23/2015 (Exact Date)  Physical Examination: CONSTITUTIONAL: Well-developed, well-nourished female in no acute distress.  HENT:  Normocephalic, atraumatic, External right and left ear normal. Oropharynx is clear and moist EYES: Conjunctivae and EOM are normal. Pupils are equal, round, and reactive to light. No scleral icterus.  NECK: Normal range of motion, supple, no masses SKIN: Skin is warm and dry. No rash noted. Not diaphoretic. No erythema. No pallor. NEUROLGIC: Alert and oriented to person, place, and time. Normal reflexes, muscle tone coordination. No cranial nerve deficit noted. PSYCHIATRIC: Normal mood and affect. Normal behavior. Normal judgment and thought content. CARDIOVASCULAR: Normal heart rate noted, regular rhythm RESPIRATORY: Effort and breath sounds normal, no problems with respiration noted ABDOMEN: Soft, nontender, nondistended, gravid. MUSCULOSKELETAL: Normal range of motion. No edema and no tenderness. 2+ distal pulses.  Cervix: deferred due to PPROM Membranes:ruptured, pink stained fluid, pooling on sterile spec exam. Cervix visually closed Fetal Monitoring:160 Tocometer: Flat  Labs:  Results for orders placed or performed during the hospital encounter of 08/03/16 (from the past 24 hour(s))  Urinalysis, Routine w reflex microscopic   Collection Time: 08/03/16  9:30 AM  Result Value Ref Range   Color, Urine YELLOW YELLOW   APPearance CLEAR CLEAR   Specific Gravity, Urine 1.019 1.005 - 1.030   pH 5.0 5.0 - 8.0   Glucose, UA NEGATIVE NEGATIVE mg/dL   Hgb urine dipstick SMALL (A) NEGATIVE   Bilirubin Urine NEGATIVE NEGATIVE   Ketones, ur NEGATIVE NEGATIVE mg/dL   Protein, ur NEGATIVE NEGATIVE mg/dL   Nitrite NEGATIVE NEGATIVE   Leukocytes, UA NEGATIVE  NEGATIVE   RBC / HPF 0-5 0 - 5 RBC/hpf   WBC, UA 0-5 0 - 5 WBC/hpf   Bacteria, UA NONE SEEN NONE SEEN   Squamous Epithelial / LPF 6-30 (A) NONE SEEN   Mucous PRESENT     Imaging Studies: [place] Mfm Ob Detail +14 Wk  Result Date: 07/08/2016 OBSTETRICAL ULTRASOUND: This exam was performed within a [facility]. The OB US report was generated in the AS system, and faxed to the ordering physician.  This report is available in the [employer]. See the AS Obstetric US report via the Image Link.  [place] Mfm Ob Limited  Result Date: 07/22/2016 OBSTETRICAL ULTRASOUND: This exam was performed within a [facility]. The OB US report was  generated in the AS system, and faxed to the ordering physician.  This report is available in the [employer]. See the AS Obstetric US report via the Image Link.    Assessment and Plan: Patient Active Problem List   Diagnosis Date Noted  . Preterm premature rupture of membranes (PPROM) with unknown onset of labor 08/03/2016  . Oligohydramnios antepartum, second trimester, fetus 1 07/20/2016  . GBS (group B Streptococcus carrier), +RV culture, currently pregnant 05/12/2016  . Supervision of other normal pregnancy, antepartum 05/25/2015  . Family hx of hydrocephalus 04/03/2012  . Family hx of bicuspid aortic valve--FOB 04/03/2012  . History of abnormal Pap smear 01/02/2012  . history of genital warts 12/26/2011  . History of smoking 12/26/2011   #1  PPROM - gush of fluid and drop in AFI from 7.5 to 1.5 today, pooling on exam, butno ferning.  Admit to Antenatal Routine antenatal care Latency Antibiotics 1g PO azithromycin, AMP/amoxicin for 7 days GBS Growth Scan Friday Betamethasone  Ernestina Penna, MD OB Fellow Faculty Practice, [facility]

## 2016-08-03 NOTE — MAU Note (Signed)
Pt states she sneezed this morning, felt gushes of something - was bright red bleeding.  Had amnisure last week for ? Leaking, was negative.  C/O stomach ache & mild back pain.  Denies contractions.

## 2016-08-04 DIAGNOSIS — Z3A23 23 weeks gestation of pregnancy: Secondary | ICD-10-CM

## 2016-08-04 DIAGNOSIS — O42912 Preterm premature rupture of membranes, unspecified as to length of time between rupture and onset of labor, second trimester: Secondary | ICD-10-CM

## 2016-08-04 DIAGNOSIS — O99820 Streptococcus B carrier state complicating pregnancy: Secondary | ICD-10-CM

## 2016-08-04 LAB — CULTURE, BETA STREP (GROUP B ONLY)

## 2016-08-04 NOTE — Consult Note (Signed)
Neonatology Consult Note:  At the request of the patients obstetrician Dr. Ervin I met with Robin [name] who is currently 23 weeks with pregnancy complicated by PPROM.  She denies uterine contractions or cramping.  She is on latency antibiotics. S/P BMZ. U/S for growth tomorrow.  We discussed morbidity/mortality at this gestional age, delivery room resuscitation, including intubation and surfactant in DR.  Discussed mechanical ventilation and risk for chronic lung disease, risk for IVH with potential for motor / cognitive deficits, ROP, NEC, sepsis, as well as temperature instability and feeding immaturity.  Discussed NG / OG feeds, benefits of MBM in reducing incidence of NEC.  Discussed likely length of stay. She expressed her wishes for all resuscitative efforts for baby.  Thank you for allowing us to participate in her care.  Please call with questions.   , DO  Neonatologist  The total length of face-to-face or floor / unit time for this encounter was 35 minutes.  Counseling and / or coordination of care was greater than fifty percent of the time.        

## 2016-08-04 NOTE — Progress Notes (Signed)
ACULTY PRACTICE ANTEPARTUM COMPREHENSIVE PROGRESS NOTE  Robin [name] is a 31 y.o. [phone] at 2168w0d  who is admitted for PROM.   Fetal presentation is cephalic. Length of Stay:  1  Days  Subjective: Pt without complaints. Denies LOF but had some spotting this morning. + Fm. Denies any cramps or ut ctx.  Vitals:  Blood pressure 105/61, pulse 85, temperature 97.5 F (36.4 C), temperature source Oral, resp. rate 18, height 5\' 6"  (1.676 m), weight 113 lb (51.3 kg), last menstrual period 02/23/2015, SpO2 100 %, currently breastfeeding. Physical Examination: Lungs clear Heart RRR Abd soft + BS gravid non tender Ext SCD's  Fetal Monitoring:  FHT's noted  Labs:  Results for orders placed or performed during the hospital encounter of 08/03/16 (from the past 24 hour(s))  CBC on admission   Collection Time: 08/03/16  3:43 PM  Result Value Ref Range   WBC 10.3 4.0 - 10.5 K/uL   RBC 3.67 (L) 3.87 - 5.11 MIL/uL   Hemoglobin 11.2 (L) 12.0 - 15.0 g/dL   HCT 45.432.5 (L) 09.836.0 - 11.946.0 %   MCV 88.6 78.0 - 100.0 fL   MCH 30.5 26.0 - 34.0 pg   MCHC 34.5 30.0 - 36.0 g/dL   RDW 14.712.5 82.911.5 - 56.215.5 %   Platelets 335 150 - 400 K/uL  Type and screen [facility]   Collection Time: 08/03/16  3:43 PM  Result Value Ref Range   ABO/RH(D) O POS    Antibody Screen NEG    Sample Expiration 08/06/2016   ABO/Rh   Collection Time: 08/03/16  3:43 PM  Result Value Ref Range   ABO/RH(D) O POS     Imaging Studies:    U/S tomorrow   Medications:  Scheduled . ampicillin (OMNIPEN) IV  2 g Intravenous Q6H   Followed by  . [START ON 08/05/2016] amoxicillin  500 mg Oral Q8H  . azithromycin  1,000 mg Oral Daily  . docusate sodium  100 mg Oral Daily  . prenatal multivitamin  1 tablet Oral Q1200   I have reviewed the patient's current medications.  ASSESSMENT: Patient Active Problem List   Diagnosis Date Noted  . Preterm premature rupture of membranes (PPROM) with unknown onset of labor  08/03/2016  . Oligohydramnios antepartum, second trimester, fetus 1 07/20/2016  . GBS (group B Streptococcus carrier), +RV culture, currently pregnant 05/12/2016  . Supervision of other normal pregnancy, antepartum 05/25/2015  . Family hx of hydrocephalus 04/03/2012  . Family hx of bicuspid aortic valve--FOB 04/03/2012  . History of abnormal Pap smear 01/02/2012  . history of genital warts 12/26/2011  . History of smoking 12/26/2011    PLAN: No S/Sx of infection. Continue with antibiotics. S/P BMZ. U/S for growth tomorrow. NICU consult pending Continue routine antenatal care.   Hermina StaggersMichael L Aryn Safran 08/04/2016,3:32 PM

## 2016-08-05 ENCOUNTER — Ambulatory Visit (HOSPITAL_COMMUNITY)
Admission: RE | Admit: 2016-08-05 | Discharge: 2016-08-05 | Disposition: A | Discharge status: Home / Self Care | Payer: 59 | Source: Ambulatory Visit | Attending: Student | Admitting: Student

## 2016-08-05 DIAGNOSIS — O42912 Preterm premature rupture of membranes, unspecified as to length of time between rupture and onset of labor, second trimester: Secondary | ICD-10-CM

## 2016-08-05 DIAGNOSIS — O4592 Premature separation of placenta, unspecified, second trimester: Secondary | ICD-10-CM | POA: Insufficient documentation

## 2016-08-05 DIAGNOSIS — Z362 Encounter for other antenatal screening follow-up: Secondary | ICD-10-CM | POA: Insufficient documentation

## 2016-08-05 DIAGNOSIS — Z3A23 23 weeks gestation of pregnancy: Secondary | ICD-10-CM | POA: Insufficient documentation

## 2016-08-05 MED ORDER — ONDANSETRON 8 MG PO TBDP
8.0000 mg | ORAL_TABLET | Freq: Three times a day (TID) | ORAL | Status: DC | PRN
  Filled 2016-08-05: qty 1

## 2016-08-05 MED ORDER — DIPHENHYDRAMINE HCL 25 MG PO CAPS: 25.0000 mg | ORAL_CAPSULE | Freq: Four times a day (QID) | ORAL | Status: DC | PRN

## 2016-08-05 MED ORDER — VITAMIN B-6 50 MG PO TABS
50.0000 mg | ORAL_TABLET | Freq: Three times a day (TID) | ORAL | Status: DC | PRN
  Filled 2016-08-05: qty 1

## 2016-08-05 MED ORDER — RA GUMMY VITAMINS & MINERALS PO CHEW
6.0000 | CHEWABLE_TABLET | Freq: Every day | ORAL | Status: DC
  Administered 2016-08-05 – 2016-08-07 (×3): 6 via ORAL
  Administered 2016-08-08: 10:00:00 via ORAL
  Administered 2016-08-09 – 2016-09-06 (×29): 6 via ORAL
  Filled 2016-08-05 (×2): qty 6

## 2016-08-05 NOTE — Progress Notes (Signed)
Pt peri pad saturated with pink fluid.

## 2016-08-05 NOTE — Progress Notes (Signed)
Initial visit with Robin [name] to introduce spiritual care services and offer support while she is on bedrest awaiting the birth of baby boy, Robin [name].  She shared that right now she expects to be here for 10-12 weeks and is most upset about being away from her family-particularly children, Hasting 8, Chantry 8 mos, and Nena [place], whose 4th birthday is on Tuesday.  This will be particularly hard while flu precautions are in place.  She has good support, but anticipates her husband will not be able to visit much during the week.  She was encouraged by knowing that additional support is available through our department.  Please page as further needs arise.  [name], M.Div. [facility] Chaplain Pager [phone] Office [phone]

## 2016-08-05 NOTE — Progress Notes (Signed)
Daily Antepartum Note  Admission Date: 08/03/2016 Current Date: 08/05/2016 11:56 AM  Robin [name] is a 31 y.o. [id number] @ 7539w1d, HD#3, admitted for PPROM.  Pregnancy complicated by: Chronic oligo vs PPROM (18wks) and SCH, GBS pos  Overnight/24hr events:  none  Subjective:  No s/s of PTL. Has some LOF (pink tinged) when she stands up in the AM but none since. No s/s of infection.   Objective:    Current Vital Signs 24h Vital Sign Ranges  T 98.9 F (37.2 C) Temp  Avg: 98.1 F (36.7 C)  Min: 97.5 F (36.4 C)  Max: 98.9 F (37.2 C)  BP (!) 93/58 BP  Min: 93/58  Max: 105/61  HR 80 Pulse  Avg: 80.9  Min: 73  Max: 89  RR 20 Resp  Avg: 18  Min: 16  Max: 20  SaO2 100 % Not Delivered SpO2  Avg: 99.2 %  Min: 98 %  Max: 100 %       24 Hour I/O Current Shift I/O  Time Ins Outs No intake/output data recorded. No intake/output data recorded.   Physical exam: General: Well nourished, well developed female in no acute distress. Abdomen: gravid , NTTP Cardiovascular: S1, S2 normal, no murmur, rub or gallop, regular rate and rhythm Respiratory: CTAB Extremities: no clubbing, cyanosis or edema Skin: Warm and dry.   Medications: Current Facility-Administered Medications  Medication Dose Route Frequency Provider Last Rate Last Dose  . acetaminophen (TYLENOL) tablet 650 mg  650 mg Oral Q4H PRN Lorne SkeensNicholas Michael Schenk, MD      . amoxicillin (AMOXIL) capsule 500 mg  500 mg Oral Q8H Lorne SkeensNicholas Michael Schenk, MD      . calcium carbonate (TUMS - dosed in mg elemental calcium) chewable tablet 400 mg of elemental calcium  2 tablet Oral Q4H PRN Lorne SkeensNicholas Michael Schenk, MD      . docusate sodium (COLACE) capsule 100 mg  100 mg Oral Daily Lorne SkeensNicholas Michael Schenk, MD   100 mg at 08/05/16 0950  . prenatal multivitamin tablet 1 tablet  1 tablet Oral Q1200 Lorne SkeensNicholas Michael Schenk, MD   1 tablet at 08/04/16 1352  . zolpidem (AMBIEN) tablet 5 mg  5 mg Oral QHS PRN Lorne SkeensNicholas Michael Schenk, MD        Labs:    Recent Labs Lab 08/03/16 1543  WBC 10.3  HGB 11.2*  HCT 32.5*  PLT 335   Radiology: 1/10 cephalic, oligo, 4cm SCH, 2.9cm cx length.  12/15: 54%, 273gm, oligo (anatomy scan)  Assessment & Plan:  Patient stable *Pregnancy: fetal status reassuring. Until 24wks, will do qshift FHTs and 638m toco. PNV *PPROM: d/w pt and she would like for NICU and OB to do everything possible if fetus was to delivery/go into PTL before 24wks. D/w her risks with 23wk c-section but I told her hopefully won't need it since she was cephalic and has oligo but if has PTL s/s will re scan. Repeat BMZ 1/25-26. Has repeat growth u/s today -BMZ course: 1/10 and 1/11 -declined genetic screening during the pregnancy.  *PPx:  SCDs, bedrest *FEN/GI: regular diet *Dispo: delivery with infection, PTL, non reassuring maternal/fetal status, 34wks  Cornelia Copaharlie Lydia Meng, Jr. MD Attending Center for [facility](Faculty Practice)

## 2016-08-05 NOTE — Progress Notes (Signed)
Patient has own Dicelgis (brought from home) and taken to pharmacy.  Medication found to be expired - pt informed and will bring current bottle from home in the morning.

## 2016-08-06 DIAGNOSIS — O42912 Preterm premature rupture of membranes, unspecified as to length of time between rupture and onset of labor, second trimester: Secondary | ICD-10-CM

## 2016-08-06 DIAGNOSIS — O99820 Streptococcus B carrier state complicating pregnancy: Secondary | ICD-10-CM

## 2016-08-06 DIAGNOSIS — Z3A23 23 weeks gestation of pregnancy: Secondary | ICD-10-CM

## 2016-08-06 MED ORDER — FLUCONAZOLE 150 MG PO TABS
150.0000 mg | ORAL_TABLET | Freq: Once | ORAL | Status: AC
  Administered 2016-08-06: 150 mg via ORAL
  Filled 2016-08-06: qty 1

## 2016-08-06 NOTE — Progress Notes (Signed)
ACULTY PRACTICE ANTEPARTUM COMPREHENSIVE PROGRESS NOTE  Robin [name] is a 31 y.o. [phone] at.53106w2d  who is admitted for PROM.   Fetal presentation is cephalic. Length of Stay:  3  Days  Subjective: Pt without complaints. +LOF this am. + Fm. Denies any cramps or ut ctx.  Vitals:  Blood pressure (!) 96/59, pulse 90, temperature 98.2 F (36.8 C), temperature source Oral, resp. rate 18, height 5\' 6"  (1.676 m), weight 113 lb (51.3 kg), last menstrual period 02/23/2015, SpO2 97 %, currently breastfeeding. Physical Examination: Lungs clear Heart RRR Abd soft + BS gravid non tender Ext SCD's  Fetal Monitoring:  FHT's noted  Labs:  No results found for this or any previous visit (from the past 24 hour(s)).  Imaging Studies:    U/S tomorrow   Medications:  Scheduled . amoxicillin  500 mg Oral Q8H  . docusate sodium  100 mg Oral Daily  . prenatal multivitamin  1 tablet Oral Q1200  . RA GUMMY VITAMINS & MINERALS  6 tablet Oral Daily   I have reviewed the patient's current medications.  ASSESSMENT: Patient Active Problem List   Diagnosis Date Noted  . Preterm premature rupture of membranes (PPROM) with unknown onset of labor 08/03/2016  . Oligohydramnios antepartum, second trimester, fetus 1 07/20/2016  . GBS (group B Streptococcus carrier), +RV culture, currently pregnant 05/12/2016  . Supervision of other normal pregnancy, antepartum 05/25/2015  . Family hx of hydrocephalus 04/03/2012  . Family hx of bicuspid aortic valve--FOB 04/03/2012  . History of abnormal Pap smear 01/02/2012  . history of genital warts 12/26/2011  . History of smoking 12/26/2011    PLAN: Patient stable *Pregnancy: fetal status reassuring. Until 24wks, will do qshift FHTs and 2875m toco. PNV *PPROM: d/w pt and she would like for NICU and OB to do everything possible if fetus was to delivery/go into PTL before 24wks. D/w her risks with 23wk c-section but I told her hopefully won't need it since she was  cephalic and has oligo but if has PTL s/s will re scan. Repeat BMZ 1/25-26. Has repeat growth u/s today -BMZ course: 1/10 and 1/11 -declined genetic screening during the pregnancy.  *PPx:  SCDs, bedrest *FEN/GI: regular diet *Dispo: delivery with infection, PTL, non reassuring maternal/fetal status, 34wks   Azelia Reiger H 08/06/2016,7:30 AM Patient ID: Robin CarolLindsay C Currie, female   DOB: [birth year], 31 y.o.   MRN: [id number]

## 2016-08-07 DIAGNOSIS — O42919 Preterm premature rupture of membranes, unspecified as to length of time between rupture and onset of labor, unspecified trimester: Secondary | ICD-10-CM

## 2016-08-07 LAB — TYPE AND SCREEN
ABO/RH(D): O POS
Antibody Screen: NEGATIVE

## 2016-08-07 MED ORDER — SODIUM CHLORIDE 0.9% FLUSH
3.0000 mL | Freq: Two times a day (BID) | INTRAVENOUS | Status: DC
  Administered 2016-08-07 – 2016-08-09 (×5): 3 mL via INTRAVENOUS

## 2016-08-07 NOTE — Progress Notes (Signed)
Patient ID: Robin [name], female   DOB: 10-22-1985, 31 y.o.   MRN: [id number] FACULTY PRACTICE ANTEPARTUM(COMPREHENSIVE) NOTE  Robin [name] is a 31 y.o. [id number] at 664w3d by best clinical estimate who is admitted for rupture of membranes.   Fetal presentation is cephalic. Length of Stay:  4  Days  Subjective: Leaking brown stained fluid. C/o dysuria, symptoms of yeast infectin Patient reports the fetal movement as active. Patient reports uterine contraction  activity as none. Patient reports  vaginal bleeding as scant staining. Patient describes fluid per vagina as Other dark brown.  Vitals:  Blood pressure (!) 98/52, pulse 62, temperature 97.9 F (36.6 C), temperature source Oral, resp. rate 17, height 5\' 6"  (1.676 m), weight 113 lb (51.3 kg), last menstrual period 02/23/2015, SpO2 99 %, currently breastfeeding. Physical Examination:  General appearance - alert, well appearing, and in no distress Chest - normal effort Abdomen - gravid, Fundus below umbilicus, non-tender Fundal Height:  size less than dates Extremities: Homans sign is negative, no sign of DVT  Membranes:ruptured  Fetal Monitoring:  Baseline: 150 bpm, Variability: Good {> 6 bpm), Accelerations: Non-reactive but appropriate for gestational age and Decelerations: Variable: mild   Medications:  Scheduled . amoxicillin  500 mg Oral Q8H  . docusate sodium  100 mg Oral Daily  . prenatal multivitamin  1 tablet Oral Q1200  . RA GUMMY VITAMINS & MINERALS  6 tablet Oral Daily   I have reviewed the patient's current medications.  ASSESSMENT: Active Problems:   Preterm premature rupture of membranes (PPROM) with unknown onset of labor   Vulvar candidiasis - due to abx use  PLAN: Continue in-house monitoring Delivery s/sx's chorio Continue Abx Diflucan prn  Reva Boresanya S Avy Barlett, MD 08/07/2016,7:27 AM

## 2016-08-08 DIAGNOSIS — O88112 Amniotic fluid embolism in pregnancy, second trimester: Secondary | ICD-10-CM

## 2016-08-08 DIAGNOSIS — Z3A23 23 weeks gestation of pregnancy: Secondary | ICD-10-CM

## 2016-08-08 DIAGNOSIS — O42912 Preterm premature rupture of membranes, unspecified as to length of time between rupture and onset of labor, second trimester: Secondary | ICD-10-CM

## 2016-08-08 DIAGNOSIS — O99820 Streptococcus B carrier state complicating pregnancy: Secondary | ICD-10-CM

## 2016-08-08 MED ORDER — DOXYLAMINE-PYRIDOXINE 10-10 MG PO TBEC
1.0000 | DELAYED_RELEASE_TABLET | Freq: Every evening | ORAL | Status: DC | PRN
  Administered 2016-08-10 – 2016-08-20 (×2): 1 via ORAL

## 2016-08-08 NOTE — Progress Notes (Signed)
Patient ID: Robin [name] Betzler, female   DOB: January 21, 1986, 31 y.o.   MRN: [id number] FACULTY PRACTICE ANTEPARTUM(COMPREHENSIVE) NOTE  Robin [name] Miers is a 31 y.o. [id number] at 2445w4d  by best clinical estimate who is admitted for rupture of membranes.   Fetal presentation is cephalic. Length of Stay:  5  Days  Subjective: leakin small amount of fluid, no other complaints Patient reports the fetal movement as active. Patient reports uterine contraction  activity as none. Patient reports  vaginal bleeding as scant staining. Patient describes fluid per vagina as Other dark brown.  Vitals:  Blood pressure (!) 101/43, pulse 63, temperature 97.7 F (36.5 [name]), temperature source Oral, resp. rate 18, height 5\' 6"  (1.676 m), weight 113 lb (51.3 kg), last menstrual period 02/23/2015, SpO2 99 %, currently breastfeeding. Physical Examination:  General appearance - alert, well appearing, and in no distress Chest - normal effort Abdomen - gravid, Fundus below umbilicus, non-tender Fundal Height:  size less than dates Extremities: Homans sign is negative, no sign of DVT  Membranes:ruptured  Fetal Monitoring:  Baseline: 150 bpm, Variability: Good {> 6 bpm), Accelerations: Non-reactive but appropriate for gestational age and Decelerations: Variable: mild   Medications:  Scheduled . amoxicillin  500 mg Oral Q8H  . docusate sodium  100 mg Oral Daily  . prenatal multivitamin  1 tablet Oral Q1200  . RA GUMMY VITAMINS & MINERALS  6 tablet Oral Daily  . sodium chloride flush  3 mL Intravenous Q12H   I have reviewed the patient's current medications.  ASSESSMENT: Active Problems:   Preterm premature rupture of membranes (PPROM) with unknown onset of labor   Vulvar candidiasis - due to abx use  PLAN: Continue in-house monitoring Delivery s/sx's chorio Continue Abx Diflucan prn  Lazaro ArmsEURE,Patricie Geeslin H, MD 08/08/2016,7:44 AMPatient ID: Robin [name] Schlageter, female   DOB: January 21, 1986, 31 y.o.   MRN: [id number]

## 2016-08-09 DIAGNOSIS — M9905 Segmental and somatic dysfunction of pelvic region: Secondary | ICD-10-CM

## 2016-08-09 DIAGNOSIS — N939 Abnormal uterine and vaginal bleeding, unspecified: Secondary | ICD-10-CM

## 2016-08-09 DIAGNOSIS — M9904 Segmental and somatic dysfunction of sacral region: Secondary | ICD-10-CM

## 2016-08-09 DIAGNOSIS — M9903 Segmental and somatic dysfunction of lumbar region: Secondary | ICD-10-CM

## 2016-08-09 DIAGNOSIS — M549 Dorsalgia, unspecified: Secondary | ICD-10-CM

## 2016-08-09 DIAGNOSIS — O26892 Other specified pregnancy related conditions, second trimester: Secondary | ICD-10-CM

## 2016-08-09 MED ORDER — BISACODYL 5 MG PO TBEC
10.0000 mg | DELAYED_RELEASE_TABLET | Freq: Every day | ORAL | Status: DC | PRN
  Administered 2016-08-09 – 2016-08-20 (×2): 10 mg via ORAL
  Filled 2016-08-09 (×4): qty 2

## 2016-08-09 NOTE — Progress Notes (Signed)
Patient ID: Robin [name], female   DOB: 1986-06-28, 31 y.o.   MRN: [id number] FACULTY PRACTICE ANTEPARTUM(COMPREHENSIVE) NOTE  Robin [name] is a 31 y.o. [id number] at 324w4d  by best clinical estimate who is admitted for rupture of membranes.   Fetal presentation is cephalic. Length of Stay:  6  Days  Subjective: leaking small amount of clear fluid, denies abdominal pain, contractions, cramping, fever, nausea.  Patient reports the fetal movement as active. Patient reports uterine contraction  activity as none. Patient reports  vaginal bleeding as scant staining. Patient describes fluid per vagina as Other dark brown.  Vitals:  Blood pressure (!) 100/58, pulse 80, temperature 98 F (36.7 C), temperature source Oral, resp. rate 16, height 5\' 6"  (1.676 m), weight 113 lb (51.3 kg), last menstrual period 02/23/2015, SpO2 100 %, currently breastfeeding. Physical Examination:  General appearance - alert, well appearing, and in no distress Heart: regular rate, no murmur Lungs: clear to auscultation bilaterally, no wheezing. Abdomen - gravid, Fundus below umbilicus, non-tender Fundal Height:  size less than dates Extremities: Homans sign is negative, no sign of DVT  Membranes:ruptured  Fetal Monitoring:  Baseline: 150 bpm, Variability: Good {> 6 bpm), Accelerations: Non-reactive but appropriate for gestational age and Decelerations: Variable: mild x3   Medications:  Scheduled . amoxicillin  500 mg Oral Q8H  . docusate sodium  100 mg Oral Daily  . RA GUMMY VITAMINS & MINERALS  6 tablet Oral Daily  . sodium chloride flush  3 mL Intravenous Q12H   I have reviewed the patient's current medications.  ASSESSMENT: Active Problems:   Preterm premature rupture of membranes (PPROM) with unknown onset of labor   Vulvar candidiasis - due to abx use  PLAN: 1. PPROM   Continue in-house monitoring  Delivery s/sx's chorio  Continue Abx  Robin HeritageJacob J Yeraldy Spike, DO 08/09/2016,7:21 AM

## 2016-08-10 LAB — TYPE AND SCREEN
ABO/RH(D): O POS
Antibody Screen: NEGATIVE

## 2016-08-10 NOTE — Progress Notes (Signed)
Patient ID: Robin [name], female   DOB: [birth year], 31 y.o.   MRN: [id number] FACULTY PRACTICE ANTEPARTUM(COMPREHENSIVE) NOTE  Robin [name] is a 31 y.o. [id number] at 331w6d  by best clinical estimate who is admitted for rupture of membranes.   Fetal presentation is cephalic. Length of Stay:  7  Days  Subjective: Patient had emesis and abdominal pain overnight which all resolved following a bowel movement. She feels completely better. She reports some persistent leakage of clear fluid. She denies abdominal pain, contractions, cramping, nausea.  Patient reports the fetal movement as active. Patient reports uterine contraction  activity as none. Patient reports  vaginal bleeding as scant staining. Patient describes fluid per vagina as Other dark brown.  Vitals:  Blood pressure 117/74, pulse 92, temperature 97.4 F (36.3 C), temperature source Oral, resp. rate 18, height 5\' 6"  (1.676 m), weight 113 lb (51.3 kg), last menstrual period 02/23/2015, SpO2 100 %, currently breastfeeding. Physical Examination:  General appearance - alert, well appearing, and in no distress Heart: regular rate, no murmur Lungs: clear to auscultation bilaterally, no wheezing. Abdomen - gravid, Fundus below umbilicus, non-tender Fundal Height:  size less than dates Extremities: Homans sign is negative, no sign of DVT  Membranes:ruptured  Fetal Monitoring:  Baseline: 140 bpm, Variability: Good {> 6 bpm), Accelerations: Non-reactive but appropriate for gestational age and Decelerations: Absent x3   Medications:  Scheduled . docusate sodium  100 mg Oral Daily  . RA GUMMY VITAMINS & MINERALS  6 tablet Oral Daily  . sodium chloride flush  3 mL Intravenous Q12H   I have reviewed the patient's current medications.  ASSESSMENT: Active Problems:   Preterm premature rupture of membranes (PPROM) with unknown onset of labor   Vulvar candidiasis - due to abx use  PLAN: 1. PPROM   Continue in-house  monitoring  Delivery s/sx's chorio  S/p latency antibiotics  Catalina AntiguaPeggy Yeshua Stryker, MD 08/10/2016,7:04 AM

## 2016-08-11 DIAGNOSIS — O42912 Preterm premature rupture of membranes, unspecified as to length of time between rupture and onset of labor, second trimester: Secondary | ICD-10-CM

## 2016-08-11 DIAGNOSIS — O99820 Streptococcus B carrier state complicating pregnancy: Secondary | ICD-10-CM

## 2016-08-11 DIAGNOSIS — Z3A24 24 weeks gestation of pregnancy: Secondary | ICD-10-CM

## 2016-08-11 DIAGNOSIS — O88112 Amniotic fluid embolism in pregnancy, second trimester: Secondary | ICD-10-CM

## 2016-08-11 NOTE — Progress Notes (Signed)
UR chart review completed.  

## 2016-08-11 NOTE — Progress Notes (Signed)
Initial Nutrition Assessment  DOCUMENTATION CODES:   Not applicable  INTERVENTION:  Regular diet May order double protein portions, snacks TID and from retail  NUTRITION DIAGNOSIS:   Increased nutrient needs related to  (pregnancy and fetal growth requirements) as evidenced by  (24 weeks IUP).  GOAL:   Patient will meet greater than or equal to 90% of their needs, Weight gain MONITOR:  Weight trends  REASON FOR ASSESSMENT:  Antenatal   ASSESSMENT:  24 weeks IUP, PROM. Pre-preg weight 109 lbs. 4 lb weight gain, pre-preg BMI 17.6. good appetite, consuming 3 meals per day while admited. Snack and retail menus provided  Diet Order:  Diet regular Room service appropriate? Yes; Fluid consistency: Thin Skin:  Reviewed, no issues  Last BM:  1/17 Height:   Ht Readings from Last 1 Encounters:  08/03/16 5\' 6"  (1.676 m)   Weight:   Wt Readings from Last 1 Encounters:  08/03/16 113 lb (51.3 kg)   Ideal Body Weight:   130 lbs  BMI:  Body mass index is 18.24 kg/m.  Estimated Nutritional Needs:   Kcal:  1600-1800  Protein:  75-85 g  Fluid:  1.9 L  EDUCATION NEEDS:   No education needs identified at this time  Inez PilgrimKatherine Cleo Villamizar M.Odis LusterEd. R.D. LDN Neonatal Nutrition Support Specialist/RD III Pager [phone]      Phone [phone]

## 2016-08-11 NOTE — Progress Notes (Signed)
ACULTY PRACTICE ANTEPARTUM COMPREHENSIVE PROGRESS NOTE  Robin [name] is a 31 y.o. [phone] [id number] at 716w0d  who is admitted for PROM.   Fetal presentation is cephalic. Length of Stay:  8  Days  Subjective: Pt without complaints this morning. Reports + Fetal movement. Denies any uterine contractions. Still occasional LOF.   Vitals:  Blood pressure (!) 102/55, pulse 75, temperature 97.6 F (36.4 C), temperature source Oral, resp. rate 18, height 5\' 6"  (1.676 m), weight 113 lb (51.3 kg), last menstrual period 02/23/2015, SpO2 98 %, currently breastfeeding.   Physical Examination: Lungs clear Heart RRR Abd soft + BS gravid non tender Ext non tender  Fetal Monitoring:  Baseline: 140's bpm  Labs:  Results for orders placed or performed during the hospital encounter of 08/03/16 (from the past 24 hour(s))  Type and screen [facility] [place] [facility]   Collection Time: 08/10/16  9:58 PM  Result Value Ref Range   ABO/RH(D) O POS    Antibody Screen NEG    Sample Expiration 08/13/2016     Imaging Studies:    None  Medications:  Scheduled . docusate sodium  100 mg Oral Daily  . RA GUMMY VITAMINS & MINERALS  6 tablet Oral Daily   I have reviewed the patient's current medications.  ASSESSMENT: Patient Active Problem List   Diagnosis Date Noted  . Preterm premature rupture of membranes (PPROM) with unknown onset of labor 08/03/2016  . Oligohydramnios antepartum, second trimester, fetus 1 07/20/2016  . GBS (group B Streptococcus carrier), +RV culture, currently pregnant 05/12/2016  . Supervision of other normal pregnancy, antepartum 05/25/2015  . Family hx of hydrocephalus 04/03/2012  . Family hx of bicuspid aortic valve--FOB 04/03/2012  . History of abnormal Pap smear 01/02/2012  . history of genital warts 12/26/2011  . History of smoking 12/26/2011    PLAN: IUP 20 weeks PPROM GBS positive  Stable. No S/Sx of infection presently. Continue with present management. Growth scan at  26 weeks Continue routine antenatal care.   Hermina StaggersMichael L Maryjane Benedict 08/11/2016,7:45 AM

## 2016-08-12 NOTE — Progress Notes (Signed)
FACULTY PRACTICE ANTEPARTUM(COMPREHENSIVE) NOTE  Robin [name] is a 31 y.o. [phone] at 6778w1d by who is admitted for rupture of membranes.   Fetal presentation is cephalic. Length of Stay:  9  Days  Subjective:  Patient reports the fetal movement as active. Patient reports uterine contraction  activity as none. Patient reports  vaginal bleeding as none. Patient describes fluid per vagina as Clear.  Vitals:  Blood pressure (!) 92/59, pulse 77, temperature 98 F (36.7 C), temperature source Oral, resp. rate 18, height 5\' 6"  (1.676 m), weight 51.3 kg (113 lb), last menstrual period 02/23/2015, SpO2 100 %, currently breastfeeding. Physical Examination:  General appearance - alert, well appearing, and in no distress Heart - normal rate and regular rhythm Abdomen - soft, nontender, nondistended Fundal Height:  size equals dates Cervical Exam: Not evaluated. Extremities: extremities normal Membranes:ruptured  Fetal Monitoring:     Fetal Heart Rate A  Mode Doppler filed at 08/11/2016 2244  Baseline Rate (A) 155 bpm filed at 08/11/2016 2244  Variability <5 BPM, 6-25 BPM filed at 08/10/2016 2234  Accelerations 15 x 15 filed at 08/10/2016 2234  Decelerations Variable filed at 08/10/2016 2234     Labs:  No results found for this or any previous visit (from the past 24 hour(s)).    Medications:  Scheduled . docusate sodium  100 mg Oral Daily  . RA GUMMY VITAMINS & MINERALS  6 tablet Oral Daily   I have reviewed the patient's current medications.  ASSESSMENT: Patient Active Problem List   Diagnosis Date Noted  . Preterm premature rupture of membranes (PPROM) with unknown onset of labor 08/03/2016  . Oligohydramnios antepartum, second trimester, fetus 1 07/20/2016  . GBS (group B Streptococcus carrier), +RV culture, currently pregnant 05/12/2016  . Supervision of other normal pregnancy, antepartum 05/25/2015  . Family hx of hydrocephalus 04/03/2012  . Family hx of bicuspid  aortic valve--FOB 04/03/2012  . History of abnormal Pap smear 01/02/2012  . history of genital warts 12/26/2011  . History of smoking 12/26/2011    PLAN: Continue hospitalization for observation with high risk for PTL s/p ruptured membranes  Scheryl DarterJames Fitzpatrick [place] 08/12/2016,6:43 AM

## 2016-08-12 NOTE — Progress Notes (Signed)
UR chart review completed.  

## 2016-08-13 LAB — TYPE AND SCREEN
ABO/RH(D): O POS
Antibody Screen: NEGATIVE

## 2016-08-13 NOTE — Progress Notes (Addendum)
Daily Antepartum Note  Admission Date: 08/03/2016 Current Date: 08/13/2016 7:42 AM  Robin [name] is a 31 y.o. [id number] @ 4923w1d, HD#11, admitted for PPROM.  Pregnancy complicated by: Chronic oligo vs PPROM (18wks) and SCH, GBS pos  Overnight/24hr events:  none  Subjective:  No s/s of PTL or infection. +benign slight LOF  Objective:    Current Vital Signs 24h Vital Sign Ranges  T 97.9 F (36.6 C) Temp  Avg: 98 F (36.7 C)  Min: 97.6 F (36.4 C)  Max: 98.5 F (36.9 C)  BP (!) 95/58 BP  Min: 90/42  Max: 111/57  HR 87 Pulse  Avg: 83.4  Min: 79  Max: 88  RR 16 Resp  Avg: 17.6  Min: 16  Max: 18  SaO2 99 % Not Delivered SpO2  Avg: 98.7 %  Min: 98 %  Max: 99 %       24 Hour I/O Current Shift I/O  Time Ins Outs No intake/output data recorded. No intake/output data recorded.   Physical exam: General: Well nourished, well developed female in no acute distress. Abdomen: gravid , NTTP Cardiovascular: S1, S2 normal, no murmur, rub or gallop, regular rate and rhythm Respiratory: CTAB Extremities: no clubbing, cyanosis or edema Skin: Warm and dry.   Medications: Current Facility-Administered Medications  Medication Dose Route Frequency Provider Last Rate Last Dose  . acetaminophen (TYLENOL) tablet 650 mg  650 mg Oral Q4H PRN Lorne SkeensNicholas Michael Schenk, MD      . bisacodyl (DULCOLAX) EC tablet 10 mg  10 mg Oral Daily PRN Rhona RaiderJacob J Stinson, DO   10 mg at 08/09/16 1039  . calcium carbonate (TUMS - dosed in mg elemental calcium) chewable tablet 400 mg of elemental calcium  2 tablet Oral Q4H PRN Lorne SkeensNicholas Michael Schenk, MD      . diphenhydrAMINE (BENADRYL) capsule 25 mg  25 mg Oral Q6H PRN [place] Bingharlie Samyia Motter, MD      . docusate sodium (COLACE) capsule 100 mg  100 mg Oral Daily Lorne SkeensNicholas Michael Schenk, MD   100 mg at 08/12/16 0959  . Doxylamine-Pyridoxine 10-10 MG TBEC 1 tablet  1 tablet Oral QHS PRN Catalina AntiguaPeggy Constant, MD   1 tablet at 08/10/16 0417  . ondansetron (ZOFRAN-ODT) disintegrating tablet 8  mg  8 mg Oral Q8H PRN [place] Bingharlie Doreatha Offer, MD      . pyridOXINE (VITAMIN B-6) tablet 50 mg  50 mg Oral Q8H PRN [place] Bingharlie Steven Basso, MD      . RA GUMMY VITAMINS & MINERALS CHEW 6 tablet  6 tablet Oral Daily [place] Bingharlie Korbyn Vanes, MD   6 tablet at 08/12/16 0959  . zolpidem (AMBIEN) tablet 5 mg  5 mg Oral QHS PRN Lorne SkeensNicholas Michael Schenk, MD        Labs:  No new labs  Radiology:  1/12: ceph, oligo, 533gm, 46%, normal AC and cx length; [facility].  1/10 cephalic, oligo, 4cm SCH, 2.9cm cx length.  12/15: 54%, 273gm, oligo (anatomy scan)  Assessment & Plan:  Patient stable *Pregnancy: fetal status reassuring. qday NSTs *PPROM: Repeat BMZ 1/25-26.  -BMZ course: rpt BMZ course 1/25 -s/p NICU consult -s/p latency abx -declined genetic screening during the pregnancy.  *PPx:  SCDs, bedrest *FEN/GI: regular diet *Dispo: delivery with infection, PTL, non reassuring maternal/fetal status, 34wks.   Cornelia Copaharlie Alie Hardgrove, Jr. MD Attending Center for [facility](Faculty Practice)

## 2016-08-13 NOTE — Plan of Care (Signed)
Problem: Tissue Perfusion: Goal: Risk factors for ineffective tissue perfusion will decrease Outcome: Completed/Met Date Met: 08/13/16 SCD on for (VTE) prophylaxis.

## 2016-08-14 DIAGNOSIS — O42912 Preterm premature rupture of membranes, unspecified as to length of time between rupture and onset of labor, second trimester: Secondary | ICD-10-CM

## 2016-08-14 DIAGNOSIS — O88112 Amniotic fluid embolism in pregnancy, second trimester: Secondary | ICD-10-CM

## 2016-08-14 DIAGNOSIS — O99820 Streptococcus B carrier state complicating pregnancy: Secondary | ICD-10-CM

## 2016-08-14 DIAGNOSIS — Z3A24 24 weeks gestation of pregnancy: Secondary | ICD-10-CM

## 2016-08-14 NOTE — Progress Notes (Signed)
FACULTY PRACTICE ANTEPARTUM(COMPREHENSIVE) NOTE  Robin [name] is a 31 y.o. [phone] at 10167w3d by LMP, early ultrasound who is admitted for PROM.   Fetal presentation is cephalic. Length of Stay:  11  Days  Subjective: Pt remains stable, lite d/c each morning, none the rest of day Patient reports the fetal movement as active. Patient reports uterine contraction  activity as none. Patient reports  vaginal bleeding as none. Patient describes fluid per vagina as Clear.  Vitals:  Blood pressure (!) 94/48, pulse 70, temperature 97.9 F (36.6 C), temperature source Oral, resp. rate 18, height 5\' 6"  (1.676 m), weight 51.3 kg (113 lb), last menstrual period 02/23/2015, SpO2 99 %, currently breastfeeding. Physical Examination:  General appearance - alert, well appearing, and in no distress and normal appearing weight Heart - normal rate and regular rhythm Abdomen - soft, nontender, nondistended Fundal Height:  size less than dates U-0 Cervical Exam: Not evaluated.  Extremities: extremities normal, atraumatic, no cyanosis or edema and Homans sign is negative, no sign of DVT with DTRs 2+ bilaterally Membranes:intact, ruptured, clear fluid  Fetal Monitoring:  Twice daily fht  Labs:  Results for orders placed or performed during the hospital encounter of 08/03/16 (from the past 24 hour(s))  Type and screen [facility] [place]   Collection Time: 08/13/16  9:45 PM  Result Value Ref Range   ABO/RH(D) O POS    Antibody Screen NEG    Sample Expiration 08/16/2016     Imaging Studies:     Currently EPIC will not allow sonographic studies to automatically populate into notes.  In the meantime, copy and paste results into note or free text.  Medications:  Scheduled . docusate sodium  100 mg Oral Daily  . RA GUMMY VITAMINS & MINERALS  6 tablet Oral Daily   I have reviewed the patient's current medications.  ASSESSMENT: Patient Active Problem List   Diagnosis Date Noted  . Preterm  premature rupture of membranes (PPROM) with unknown onset of labor 08/03/2016  . Oligohydramnios antepartum, second trimester, fetus 1 07/20/2016  . GBS (group B Streptococcus carrier), +RV culture, currently pregnant 05/12/2016  . Supervision of other normal pregnancy, antepartum 05/25/2015  . Family hx of hydrocephalus 04/03/2012  . Family hx of bicuspid aortic valve--FOB 04/03/2012  . History of abnormal Pap smear 01/02/2012  . history of genital warts 12/26/2011  . History of smoking 12/26/2011    PLAN: Continue inpt care . Repeat BMZ at 1/25 and 1/ 26   [name] 08/14/2016,7:48 AM    Patient ID: Robin [name], female   DOB: February 23, 1986, 31 y.o.   MRN: [id number]

## 2016-08-15 ENCOUNTER — Encounter (HOSPITAL_COMMUNITY): Payer: Self-pay | Admitting: *Deleted

## 2016-08-15 NOTE — Care Management Note (Deleted)
Case Management Note  Patient Details  Name: Robin [name] MRN: [id number] Date of Birth: 1985/09/06  Subjective/Objective:                 [redacted] weeks pregnant ; flu , decreased sodium   Action/Plan: Patient has no insurance  Expected Discharge Date:        08/15/16          Expected Discharge Plan:   08/15/16  In-House Referral:   financial counselor   Status of Service:   completed   Additional Comments: CM was reviewing patient's chart and saw that patient had no insurance listed on file.  Patient met with patient in room and she stated she currently did not have any insurance.  Patient stated she just found out she was pregnant.  CM called and spoke to [place] and asked FC to see patient today prior to discharge to start process and or get an appointment for [facility] application.  Patient in agreement with plan. CM called [facility] patient's regular pharmacy and checked prices of her prescriptions for discharge.  Per pharmacist there "Larkin Ina it would be close to $300.  Patient does not have the money to afford her medicines.  CM filled out [facility] for patient and b/c patient is getting a CBG monitor/strips patient will need to go to either [facility] or [facility] per [place].  Patient chose [facility].  Patient instructed that her medicines will be $3.00 each and she verbalized understanding and plans to go to pharmacy prior to 6:00pm today .  CM spoke to [facility] at [place] and gave information to her prior to patient's arrival. Patient will not be able to fill her Cough syrup Tussionex for the $3.00 cost due to having a controlled substance in it and that is the [facility] rules.  Patient will have to pay out of pocket cost for it.  Yong Channel, RN 08/15/2016, 4:49 PM

## 2016-08-15 NOTE — Progress Notes (Signed)
Patient ID: Robin CarolLindsay C Corne, female   DOB: [birth year], 31 y.o.   MRN: [id number] ACULTY PRACTICE ANTEPARTUM COMPREHENSIVE PROGRESS NOTE  Robin [name] is a 31 y.o. [id number] at 3532w4d  who is admitted for PROM.   Fetal presentation is cephalic. Length of Stay:  12  Days  Subjective: Pt with no new complaints Patient reports good fetal movement.  She reports no uterine contractions, no bleeding but still reports some loss of fluid per vagina.  Vitals:  Blood pressure (!) 98/53, pulse 81, temperature 98.3 F (36.8 C), temperature source Oral, resp. rate 16, height 5\' 6"  (1.676 m), weight 113 lb (51.3 kg), last menstrual period 02/23/2015, SpO2 100 %, currently breastfeeding. Physical Examination: General appearance - alert, well appearing, and in no distress Abd: gravid; soft, NT Cervical Exam: Not evaluated. Ext: no edema or calf tenderness Membranes:ruptured  Fetal Monitoring:  + FHR; toco- no ctx  Labs:  No results found for this or any previous visit (from the past 24 hour(s)).  Imaging Studies:    Needs US for growth in 2 weeks (not scheduled)  Medications:  Scheduled . docusate sodium  100 mg Oral Daily  . RA GUMMY VITAMINS & MINERALS  6 tablet Oral Daily   I have reviewed the patient's current medications.  ASSESSMENT: Patient Active Problem List   Diagnosis Date Noted  . Preterm premature rupture of membranes (PPROM) with unknown onset of labor 08/03/2016  . Oligohydramnios antepartum, second trimester, fetus 1 07/20/2016  . GBS (group B Streptococcus carrier), +RV culture, currently pregnant 05/12/2016  . Supervision of other normal pregnancy, antepartum 05/25/2015  . Family hx of hydrocephalus 04/03/2012  . Family hx of bicuspid aortic valve--FOB 04/03/2012  . History of abnormal Pap smear 01/02/2012  . history of genital warts 12/26/2011  . History of smoking 12/26/2011    PLAN:  1. PPROM   Continue in-house monitoring  Delivery s/sx's chorio  S/p latency  antibiotics   Begin NST q shift   Repeat BMZ 1/25 and 1/26  2. +GBS  PCN in labor   Continue routine antenatal care.   Zoe Creasman Harraway-Smith 08/15/2016,6:24 AM

## 2016-08-16 DIAGNOSIS — R300 Dysuria: Secondary | ICD-10-CM | POA: Diagnosis present

## 2016-08-16 DIAGNOSIS — Z3A24 24 weeks gestation of pregnancy: Secondary | ICD-10-CM

## 2016-08-16 DIAGNOSIS — O4593 Premature separation of placenta, unspecified, third trimester: Secondary | ICD-10-CM | POA: Diagnosis present

## 2016-08-16 LAB — URINALYSIS, ROUTINE W REFLEX MICROSCOPIC
BILIRUBIN URINE: NEGATIVE
Glucose, UA: NEGATIVE mg/dL
Hgb urine dipstick: NEGATIVE
Ketones, ur: NEGATIVE mg/dL
Leukocytes, UA: NEGATIVE
NITRITE: NEGATIVE
PH: 7 (ref 5.0–8.0)
Protein, ur: NEGATIVE mg/dL
SPECIFIC GRAVITY, URINE: 1.013 (ref 1.005–1.030)

## 2016-08-16 LAB — TYPE AND SCREEN
ABO/RH(D): O POS
Antibody Screen: NEGATIVE

## 2016-08-16 NOTE — Progress Notes (Signed)
Patient ID: Robin [name], female   DOB: [birth year], 31 y.o.   MRN: [name] ACULTY PRACTICE ANTEPARTUM COMPREHENSIVE PROGRESS NOTE  Robin CarolLindsay C [name] is a 31 y.o. [id number] at 6599w4d  who is admitted for PROM.   Fetal presentation is cephalic. Length of Stay:  13  Days  Subjective: Patient had a little bit of blood on her pad this AM. No active bleeding currently. Did have similar symptoms when she was first admitted. Good fetal movement. Has some pelvic pressure with dysuria. Patient reports good fetal movement.  She reports no uterine contractions, no bleeding but still reports some loss of fluid per vagina.  Vitals:  Blood pressure (!) 97/56, pulse 81, temperature 97.9 F (36.6 C), temperature source Oral, resp. rate 18, height 5\' 6"  (1.676 m), weight 113 lb (51.3 kg), last menstrual period 02/23/2015, SpO2 100 %, currently breastfeeding. Physical Examination: General appearance - alert, well appearing, and in no distress Heart: regular rate, no murmur Lungs: clear to auscultation bilaterally, no wheezing. Abd: gravid; soft, NT. Suprapubic tenderness. Cervical Exam: Not evaluated. Ext: no edema or calf tenderness Membranes:ruptured  Fetal Monitoring:  Baseline: 140-145 bpm, Variability: Good {> 6 bpm), Accelerations: Reactive, Decelerations: Absent and no ctx  Labs:  No results found for this or any previous visit (from the past 24 hour(s)).  Imaging Studies:    Needs US for growth in 2 weeks (not scheduled)  Medications:  Scheduled . docusate sodium  100 mg Oral Daily  . RA GUMMY VITAMINS & MINERALS  6 tablet Oral Daily   I have reviewed the patient's current medications.  ASSESSMENT: Patient Active Problem List   Diagnosis Date Noted  . Preterm premature rupture of membranes (PPROM) with unknown onset of labor 08/03/2016  . Oligohydramnios antepartum, second trimester, fetus 1 07/20/2016  . GBS (group B Streptococcus carrier), +RV culture, currently pregnant 05/12/2016  .  Supervision of other normal pregnancy, antepartum 05/25/2015  . Family hx of hydrocephalus 04/03/2012  . Family hx of bicuspid aortic valve--FOB 04/03/2012  . History of abnormal Pap smear 01/02/2012  . history of genital warts 12/26/2011  . History of smoking 12/26/2011    PLAN:  1. PPROM   Continue in-house monitoring  Delivery s/sx's chorio  S/p latency antibiotics   NST x3 reactive. Continue NST  Repeat BMZ 1/25 and 1/26  With bleeding, will place patient on monitor and will evaluate whether pt needs US.  2. +GBS  PCN in labor  3. Dysuria  UA   Continue routine antenatal care.   Rhona RaiderJacob J Davi Kroon 08/16/2016,6:15 AM

## 2016-08-17 ENCOUNTER — Encounter: Payer: Managed Care, Other (non HMO) | Admitting: Obstetrics & Gynecology

## 2016-08-17 MED ORDER — DOXYLAMINE-PYRIDOXINE 10-10 MG PO TBEC
1.0000 | DELAYED_RELEASE_TABLET | Freq: Once | ORAL | Status: AC
  Administered 2016-08-17: 1 via ORAL

## 2016-08-17 NOTE — Progress Notes (Signed)
Patient ID: Robin [name], female   DOB: [birth year], 31 y.o.   MRN: [id number] FACULTY PRACTICE ANTEPARTUM(COMPREHENSIVE) NOTE  Robin [name] is a 31 y.o. [id number] at 1240w6d by early ultrasound who is admitted for PROM.   Fetal presentation is cephalic. Length of Stay:  14  Days  Subjective: Had some bright red bleeding yesterday. None since then. Patient reports the fetal movement as active. Patient reports uterine contraction  activity as none. Patient reports  vaginal bleeding as none. Patient describes fluid per vagina as Clear.  Vitals:  Blood pressure 103/61, pulse 76, temperature 98.2 F (36.8 C), temperature source Oral, resp. rate 17, height 5\' 6"  (1.676 m), weight 113 lb (51.3 kg), last menstrual period 02/23/2015, SpO2 100 %, currently breastfeeding. Physical Examination:  General appearance - alert, well appearing, and in no distress Chest - normal effort Abdomen - gravid, NT Extremities - no signs of DVT Fundal Height:  size less than dates Extremities: Homans sign is negative, no sign of DVT  Membranes:ruptured, clear fluid  Fetal Monitoring:  Baseline: 140 bpm, Variability: Good {> 6 bpm), Accelerations: Non-reactive but appropriate for gestational age and Decelerations: Variable: moderate  Labs:  Results for orders placed or performed during the hospital encounter of 08/03/16 (from the past 24 hour(s))  Type and screen [facility] [place]   Collection Time: 08/16/16  9:53 PM  Result Value Ref Range   ABO/RH(D) O POS    Antibody Screen NEG    Sample Expiration 08/19/2016     Imaging Studies:     Currently EPIC will not allow sonographic studies to automatically populate into notes.  In the meantime, copy and paste results into note or free text.  Medications:  Scheduled . docusate sodium  100 mg Oral Daily  . RA GUMMY VITAMINS & MINERALS  6 tablet Oral Daily   I have reviewed the patient's current medications.  ASSESSMENT: Active Problems:  Preterm premature rupture of membranes (PPROM) with unknown onset of labor   Vaginal bleeding   Dysuria    PLAN: S/p Abx, BMZ Delivery if s/sx's of chorio U/S for growth at 26 wks  Reva Boresanya S Alyza Artiaga, MD 08/17/2016,7:55 AM

## 2016-08-18 DIAGNOSIS — O42912 Preterm premature rupture of membranes, unspecified as to length of time between rupture and onset of labor, second trimester: Secondary | ICD-10-CM

## 2016-08-18 DIAGNOSIS — O99820 Streptococcus B carrier state complicating pregnancy: Secondary | ICD-10-CM

## 2016-08-18 DIAGNOSIS — Z3A25 25 weeks gestation of pregnancy: Secondary | ICD-10-CM

## 2016-08-18 MED ORDER — BETAMETHASONE SOD PHOS & ACET 6 (3-3) MG/ML IJ SUSP
12.0000 mg | Freq: Once | INTRAMUSCULAR | Status: AC
  Administered 2016-08-18: 12 mg via INTRAMUSCULAR
  Filled 2016-08-18: qty 2

## 2016-08-18 NOTE — Progress Notes (Signed)
ACULTY PRACTICE ANTEPARTUM COMPREHENSIVE PROGRESS NOTE  Robin [name] is a 31 y.o. [phone] at 4686w0d  who is admitted for PPROM   Fetal presentation is cephalic. Length of Stay:  15  Days  Subjective: She has no complaints this morning. Reports + Fm. She denies any cramps, contractions or vaginal bleeding. Tolerating diet.    Vitals:  Blood pressure (!) 96/55, pulse 83, temperature 98.2 F (36.8 C), temperature source Oral, resp. rate 18, height 5\' 6"  (1.676 m), weight 113 lb (51.3 kg), last menstrual period 02/23/2015, SpO2 100 %, currently breastfeeding.   Physical Examination: Lungs clear  Heart RRR Abd soft + BS gravid non tender Ext non tender  Fetal Monitoring:  Baseline: 120-140's bpm  Labs:  No results found for this or any previous visit (from the past 24 hour(s)).  Imaging Studies:    N/A   Medications:  Scheduled . docusate sodium  100 mg Oral Daily  . RA GUMMY VITAMINS & MINERALS  6 tablet Oral Daily   I have reviewed the patient's current medications.  ASSESSMENT: IUP 25 0/7 weeks PPROM    S/P antibiotics      Repeat BMZ today and tomorrow  U/S for growth at 26 wks GBS +  Tx while in labor  PLAN: Repeat BMZ today and tomorrow. U/S at 26 weeks. No S/Sx of infection. Continue with current management  Hermina StaggersMichael L Oneil Behney 08/18/2016,9:16 AM

## 2016-08-19 DIAGNOSIS — O42912 Preterm premature rupture of membranes, unspecified as to length of time between rupture and onset of labor, second trimester: Secondary | ICD-10-CM

## 2016-08-19 DIAGNOSIS — Z3A25 25 weeks gestation of pregnancy: Secondary | ICD-10-CM

## 2016-08-19 DIAGNOSIS — O99820 Streptococcus B carrier state complicating pregnancy: Secondary | ICD-10-CM

## 2016-08-19 MED ORDER — BETAMETHASONE SOD PHOS & ACET 6 (3-3) MG/ML IJ SUSP
12.0000 mg | Freq: Once | INTRAMUSCULAR | Status: AC
  Administered 2016-08-19: 12 mg via INTRAMUSCULAR
  Filled 2016-08-19: qty 2

## 2016-08-19 NOTE — Progress Notes (Signed)
Pt reporting increase in fluid leaking with a pink tinge. Pt states positive fetal movement. No complaints of cramping or uterine contractions. Will continue to monitor. Robin DaneERRI L Parnika Tweten, RN

## 2016-08-19 NOTE — Progress Notes (Signed)
ACULTY PRACTICE ANTEPARTUM COMPREHENSIVE PROGRESS NOTE  Robin [name] is a 31 y.o. [phone] at 7554w1d  who is admitted for PROM Fetal presentation is cephalic. Length of Stay:  16  Days  Subjective: Pt without complaints this morning. Reports good fetal movement. She denies cramps or ut ctx   Vitals:  Blood pressure (!) 90/49, pulse 96, temperature 97.6 F (36.4 C), temperature source Oral, resp. rate 18, height 5\' 6"  (1.676 m), weight 113 lb (51.3 kg), last menstrual period 02/23/2015, SpO2 95 %, currently breastfeeding.   Physical Examination: Lungs clear Heart RRR Abd soft + BS gravid nontender Ext non tender  Fetal Monitoring:  Baseline: 120-140's bpm  Labs:  No results found for this or any previous visit (from the past 24 hour(s)).  Imaging Studies:    N/A   Medications:  Scheduled . docusate sodium  100 mg Oral Daily  . RA GUMMY VITAMINS & MINERALS  6 tablet Oral Daily   I have reviewed the patient's current medications.  ASSESSMENT: IUP 25 1/7 weeks PPPROM   S/p antibiotics  Second course of BMZ completed today              U/S for growth at 26 wks GBS  Tx while in labor  PLAN: No s/sx of infection. Continue with current management  Hermina StaggersMichael L November Sypher 08/19/2016,6:50 AM

## 2016-08-20 ENCOUNTER — Inpatient Hospital Stay (HOSPITAL_COMMUNITY): Payer: 59

## 2016-08-20 DIAGNOSIS — O42912 Preterm premature rupture of membranes, unspecified as to length of time between rupture and onset of labor, second trimester: Secondary | ICD-10-CM | POA: Diagnosis present

## 2016-08-20 LAB — TYPE AND SCREEN
ABO/RH(D): O POS
Antibody Screen: NEGATIVE

## 2016-08-20 NOTE — Progress Notes (Signed)
ACULTY PRACTICE ANTEPARTUM COMPREHENSIVE PROGRESS NOTE  Robin [name] is a 31 y.o. [phone] at 9048w1d  who is admitted for PROM Fetal presentation is cephalic. Length of Stay:  17  Days  Subjective: Patient had small gush of sanguineous fluid this am around 0630.  No contractions.  Good FM.  Vitals:  Blood pressure 111/64, pulse 77, temperature 98 F (36.7 C), temperature source Oral, resp. rate 16, height 5\' 6"  (1.676 m), weight 113 lb (51.3 kg), last menstrual period 02/23/2015, SpO2 96 %, currently breastfeeding.   Physical Examination: BP 111/64 (BP Location: Left Arm)   Pulse 77   Temp 98 F (36.7 C) (Oral)   Resp 16   Ht 5\' 6"  (1.676 m)   Wt 113 lb (51.3 kg)   LMP 02/23/2015 (Exact Date)   SpO2 96%   BMI 18.24 kg/m  Gen NAD Lungs clear Heart RRR Abd soft + BS gravid nontender Cervix Dilation: Closed Exam by:: Dr. Gladis [name] Ext non tender  Fetal Monitoring:  Baseline: 120-140's bpm, small accelerations, +moderate variability, no decelerations  Labs:  Results for orders placed or performed during the hospital encounter of 08/03/16 (from the past 24 hour(s))  Type and screen [facility] [place]   Collection Time: 08/19/16  9:40 PM  Result Value Ref Range   ABO/RH(D) O POS    Antibody Screen NEG    Sample Expiration 08/22/2016     Imaging Studies:  N/A   Medications:  Scheduled . docusate sodium  100 mg Oral Daily  . RA GUMMY VITAMINS & MINERALS  6 tablet Oral Daily   I have reviewed the patient's current medications.  ASSESSMENT AND PLAN: IUP 25 1/7 weeks PPROM/SCH/Abruption  S/p antibiotics  Second course of BMZ completed 1/26              U/S for growth due 26 weeks; limited U/S ordered given episode of bleeding today  Bleeding precautions reviewed  Reassuring FHT  No s/sx of infection. Continue with current management GBS  Tx while in labor  Robin CollinsUgonna Shelagh Rayman, MD 08/20/2016,9:08 AM

## 2016-08-20 NOTE — Progress Notes (Signed)
Pt called out while on toilet, peripad saturated with bright red vaginal bleeding (weight approx 5 oz), thru underwear. Dripping blood after urination. Dr. Gladis RiffleAnywanu notified to come evaluate patient. Pt denies pain, uc's. Pt will be placed on monitor once back to bed.

## 2016-08-20 NOTE — Progress Notes (Signed)
EFM paused.  I will check heart rate intermittently.

## 2016-08-21 NOTE — Progress Notes (Signed)
Patient ID: Robin [name], female   DOB: 07/31/1985, 31 y.o.   MRN: [id number]  Robin CarolLindsay C Gebhard is a 31 y.o. [id number] at 6261w3d  who is admitted for PROM Fetal presentation is cephalic. Length of Stay:  18  Days  Subjective: Patient had severa gushes of sanguineous fluid this am around 0230.  No contractions.  Good FM.  Vitals:  Blood pressure 101/61, pulse 66, temperature 97.8 F (36.6 C), temperature source Oral, resp. rate 16, height 5\' 6"  (1.676 m), weight 118 lb (53.5 kg), last menstrual period 02/23/2015, SpO2 100 %, currently breastfeeding.   Physical Examination: BP 101/61 (BP Location: Left Arm)   Pulse 66   Temp 97.8 F (36.6 C) (Oral)   Resp 16   Ht 5\' 6"  (1.676 m)   Wt 118 lb (53.5 kg)   LMP 02/23/2015 (Exact Date)   SpO2 100%   BMI 19.05 kg/m  Gen NAD Lungs clear Heart RRR Abd soft + BS gravid nontender Ext non tender  Fetal Monitoring:  Baseline: 140 bpm, no accelerations, occasional variable,  +moderate variability, no decelerations  Labs:  No results found for this or any previous visit (from the past 24 hour(s)).  Imaging Studies:  N/A   Medications:  Scheduled . docusate sodium  100 mg Oral Daily  . RA GUMMY VITAMINS & MINERALS  6 tablet Oral Daily   I have reviewed the patient's current medications.  ASSESSMENT AND PLAN: IUP 25 3/7 weeks PPROM/SCH/Abruption  S/p antibiotics  Second course of BMZ completed 1/26              U/S for growth due 26 weeks             Prelim 08/20/16 sono (verbal conversation with MFM on call)- small abruption measuring 3 x 3 cm. These results were reviewed with the patient  Bleeding precautions reviewed  Reassuring FHT  No s/sx of infection. Continue with current management GBS  Tx while in labor  Catalina AntiguaPeggy Jacyln Carmer, MD 08/21/2016,6:46 AM

## 2016-08-22 LAB — TYPE AND SCREEN
ABO/RH(D): O POS
Antibody Screen: NEGATIVE

## 2016-08-22 NOTE — Progress Notes (Signed)
Patient ID: Robin [name], female   DOB: 1985/12/01, 31 y.o.   MRN: [id number]  Robin [name] is a 31 y.o. [id number] at 2552w4d  who is admitted for PROM Fetal presentation is cephalic. Length of Stay:  19  Days  Subjective: Spotting throughout the day  No contractions.  Good FM.  Vitals:  Blood pressure (!) 96/50, pulse 74, temperature 97.9 F (36.6 C), temperature source Oral, resp. rate 16, height 5\' 6"  (1.676 m), weight 118 lb (53.5 kg), last menstrual period 02/23/2015, SpO2 100 %, currently breastfeeding.   Physical Examination: BP (!) 96/50 (BP Location: Left Arm)   Pulse 74   Temp 97.9 F (36.6 C) (Oral)   Resp 16   Ht 5\' 6"  (1.676 m)   Wt 118 lb (53.5 kg)   LMP 02/23/2015 (Exact Date)   SpO2 100%   BMI 19.05 kg/m  Gen NAD Lungs clear Heart RRR Abd soft + BS gravid nontender Ext non tender Pelvic: Scant blood on pad  Fetal Monitoring:  Baseline: 140 bpm, no accelerations, occasional variable,  +moderate variability, no decelerations  Labs:  No results found for this or any previous visit (from the past 24 hour(s)).  Imaging Studies:  Prelim 08/20/16 sono (verbal conversation with MFM on call)- small abruption measuring 3 x 3 cm. These results were reviewed with the patient   Medications:  Scheduled . docusate sodium  100 mg Oral Daily  . RA GUMMY VITAMINS & MINERALS  6 tablet Oral Daily   I have reviewed the patient's current medications.  ASSESSMENT AND PLAN: IUP 25 4/7 weeks PPROM/SCH/Abruption  S/p antibiotics  Second course of BMZ completed 1/26              U/S for growth due 26 weeks             Bleeding precautions reviewed  Reassuring FHT  No s/sx of infection. Continue with current management GBS  Tx while in labor   [name] Luciel Brickman, MD 08/22/2016,9:03 AM

## 2016-08-23 NOTE — Progress Notes (Signed)
Patient ID: Willadean CarolLindsay C Stencil, female   DOB: 04/19/1986, 31 y.o.   MRN: [id number]  Willadean CarolLindsay C Nakajima is a 31 y.o. [id number] at 5263w4d  who is admitted for PROM Fetal presentation is cephalic. Length of Stay:  20  Days  Subjective: Spotting throughout the day  No contractions.  Good FM. Having some lower back discomfort - nonradiating, worse on right side. Gets regular chiropractic care, although nothing since being here.  Vitals:  Blood pressure (!) 97/57, pulse 88, temperature 98.3 F (36.8 C), temperature source Oral, resp. rate 20, height 5\' 6"  (1.676 m), weight 118 lb (53.5 kg), last menstrual period 02/23/2015, SpO2 98 %, currently breastfeeding.   Physical Examination: BP (!) 97/57 (BP Location: Left Arm)   Pulse 88   Temp 98.3 F (36.8 C) (Oral)   Resp 20   Ht 5\' 6"  (1.676 m)   Wt 118 lb (53.5 kg)   LMP 02/23/2015 (Exact Date)   SpO2 98%   BMI 19.05 kg/m  Gen NAD Lungs clear Heart RRR Abd soft + BS gravid nontender Ext non tender Pelvic: Scant blood on pad MSK: Tenderness, muscle spasm on right and left lower lumbar paraspinals. Neuro: gait normal.  OSE: L5 ESRR, L/L torsion, R ant innom.   Fetal Monitoring: NSTx3:  Baseline: 140 bpm, 10x10 accelerations, occasional variable,  +moderate variability,   Labs:  Results for orders placed or performed during the hospital encounter of 08/03/16 (from the past 24 hour(s))  Type and screen [facility] [place]   Collection Time: 08/22/16 10:12 PM  Result Value Ref Range   ABO/RH(D) O POS    Antibody Screen NEG    Sample Expiration 08/25/2016     Imaging Studies:   Impression  Singleton intrauterine pregnancy 25 2/7 weeks with pPROM  on follow up songographic evaluation (remote read only):  active singleton fetus  patient report of bleeding is consistent with a subchorionic  heterogeneous collection measuring 3.4 x 2.9 x 4.5 cm.  Adjacent clot visualized, images suggest placental abruption.  oligohydramnios  placenta is implanted along the uterine fundus without previa  Medications:  Scheduled . docusate sodium  100 mg Oral Daily  . RA GUMMY VITAMINS & MINERALS  6 tablet Oral Daily   I have reviewed the patient's current medications.  ASSESSMENT AND PLAN: #1 IUP 25 4/7 weeks #2 PPROM/SCH/Abruption  S/p antibiotics  Second course of BMZ completed 1/26              U/S for growth due 26 weeks             Bleeding precautions reviewed  Reassuring FHT  No s/sx of infection. Continue with current management #3 Back Pain #4 Somatic dysfunction Lumbar, sacrum, pelvis  OMT done after patient permission. Pt tolerated well. HVLA utilized with good results.   Rhona RaiderJacob J Stinson, DO 08/23/2016,6:11 AM

## 2016-08-24 NOTE — Progress Notes (Signed)
Daily Antepartum Note  Admission Date: 08/03/2016 Current Date: 08/24/2016 6:42 AM  Robin [name] is a 31 y.o. [id number] @ 6685w1d, HD#11, admitted for PPROM.  Pregnancy complicated by: Chronic oligo vs PPROM (18wks) and SCH/abruption, GBS pos  Overnight/24hr events:  none  Subjective:  No s/s of PTL or infection. Spotting first thing in the AM with rising but none since.   Objective:    Current Vital Signs 24h Vital Sign Ranges  T 98.4 F (36.9 C) Temp  Avg: 98.4 F (36.9 C)  Min: 98.1 F (36.7 C)  Max: 98.8 F (37.1 C)  BP (!) 100/53 BP  Min: 94/48  Max: 100/53  HR 88 Pulse  Avg: 85  Min: 82  Max: 88  RR 16 Resp  Avg: 16.5  Min: 16  Max: 18  SaO2 100 % Not Delivered SpO2  Avg: 99 %  Min: 98 %  Max: 100 %       24 Hour I/O Current Shift I/O  Time Ins Outs No intake/output data recorded. No intake/output data recorded.   140 baselne, +accels, no decel, mod variability Toco: quiet  Physical exam: General: Well nourished, well developed female in no acute distress. Abdomen: gravid , NTTP Cardiovascular: S1, S2 normal, no murmur, rub or gallop, regular rate and rhythm Respiratory: CTAB Extremities: no clubbing, cyanosis or edema Skin: Warm and dry.   Medications: Current Facility-Administered Medications  Medication Dose Route Frequency Provider Last Rate Last Dose  . acetaminophen (TYLENOL) tablet 650 mg  650 mg Oral Q4H PRN Lorne SkeensNicholas Michael Schenk, MD   650 mg at 08/19/16 1018  . bisacodyl (DULCOLAX) EC tablet 10 mg  10 mg Oral Daily PRN Rhona RaiderJacob J Stinson, DO   10 mg at 08/20/16 1126  . calcium carbonate (TUMS - dosed in mg elemental calcium) chewable tablet 400 mg of elemental calcium  2 tablet Oral Q4H PRN Lorne SkeensNicholas Michael Schenk, MD      . diphenhydrAMINE (BENADRYL) capsule 25 mg  25 mg Oral Q6H PRN [place] Bingharlie Maryjo Ragon, MD      . docusate sodium (COLACE) capsule 100 mg  100 mg Oral Daily Lorne SkeensNicholas Michael Schenk, MD   100 mg at 08/23/16 0954  . Doxylamine-Pyridoxine 10-10  MG TBEC 1 tablet  1 tablet Oral QHS PRN Catalina AntiguaPeggy Constant, MD   1 tablet at 08/20/16 2145  . ondansetron (ZOFRAN-ODT) disintegrating tablet 8 mg  8 mg Oral Q8H PRN [place] Bingharlie Braylei Totino, MD      . pyridOXINE (VITAMIN B-6) tablet 50 mg  50 mg Oral Q8H PRN [place] Bingharlie Antoni Stefan, MD      . RA GUMMY VITAMINS & MINERALS CHEW 6 tablet  6 tablet Oral Daily [place] Bingharlie Emireth Cockerham, MD   6 tablet at 08/23/16 0954  . zolpidem (AMBIEN) tablet 5 mg  5 mg Oral QHS PRN Lorne SkeensNicholas Michael Schenk, MD        Labs:  No new labs  Radiology:  1/27: cephalic, oligo, 3cm abruption 1/12: ceph, oligo, 533gm, 46%, normal AC and cx length; [facility].  12/15: 54%, 273gm, oligo (anatomy scan)  Assessment & Plan:  Patient stable *Pregnancy: fetal status reassuring. qday NSTs *PPROM: repeat growth scan early to mid February -BMZ course on admission and rescue course on 1/25-26 -s/p NICU consult -s/p latency abx -declined genetic screening during the pregnancy.  *PPx:  SCDs, bedrest *FEN/GI: regular diet *Dispo: delivery with infection, PTL, non reassuring maternal/fetal status, 34wks.   Cornelia Copaharlie Dontarius Sheley, Jr. MD Attending Center for [facility](Faculty Practice)

## 2016-08-25 ENCOUNTER — Inpatient Hospital Stay (HOSPITAL_COMMUNITY): Payer: 59

## 2016-08-25 DIAGNOSIS — O99820 Streptococcus B carrier state complicating pregnancy: Secondary | ICD-10-CM

## 2016-08-25 DIAGNOSIS — O42912 Preterm premature rupture of membranes, unspecified as to length of time between rupture and onset of labor, second trimester: Secondary | ICD-10-CM

## 2016-08-25 DIAGNOSIS — Z3A26 26 weeks gestation of pregnancy: Secondary | ICD-10-CM

## 2016-08-25 LAB — TYPE AND SCREEN
ABO/RH(D): O POS
ANTIBODY SCREEN: NEGATIVE

## 2016-08-25 NOTE — Progress Notes (Signed)
ACULTY PRACTICE ANTEPARTUM COMPREHENSIVE PROGRESS NOTE  Robin [name] is a 31 y.o. [phone] at 4043w0d  who is admitted for PPROM.   Fetal presentation is cephalic. Length of Stay:  22  Days HD # 12  Subjective: Pt without complaints. Reports + Fm, no ctx.  Vitals:  Blood pressure 104/64, pulse 79, temperature 98.6 F (37 C), temperature source Oral, resp. rate 18, height 5\' 6"  (1.676 m), weight 53.5 kg (118 lb), last menstrual period 02/23/2015, SpO2 99 %, currently breastfeeding. Physical Examination: Lungs clear Heart RRR Abd soft + BS gravid non tender  Fetal Monitoring:  140's + accels  Labs:  No results found for this or any previous visit (from the past 24 hour(s)).  Imaging Studies:    U/S today   Medications:  Scheduled . docusate sodium  100 mg Oral Daily  . RA GUMMY VITAMINS & MINERALS  6 tablet Oral Daily   I have reviewed the patient's current medications.  ASSESSMENT: IUP 26 0/7 weeks PPROM s/p BMZ and antibiotics  PLAN: No S/SX of infection. U/S for growth today. Continue with present management. Delivery for S/Sx infection, PTL or fetal indication or 34 weeks  Hermina StaggersMichael L Rori Goar 08/25/2016,5:51 PM

## 2016-08-26 DIAGNOSIS — O42912 Preterm premature rupture of membranes, unspecified as to length of time between rupture and onset of labor, second trimester: Secondary | ICD-10-CM

## 2016-08-26 DIAGNOSIS — O88112 Amniotic fluid embolism in pregnancy, second trimester: Secondary | ICD-10-CM

## 2016-08-26 DIAGNOSIS — Z3A26 26 weeks gestation of pregnancy: Secondary | ICD-10-CM

## 2016-08-26 DIAGNOSIS — O26852 Spotting complicating pregnancy, second trimester: Secondary | ICD-10-CM

## 2016-08-26 NOTE — Progress Notes (Signed)
Pt called out to notify vaginal bleeding. Upon assessment noted soaked peripad with bleeding through to underwear. Pt denies any pain or uc's. Pt placed on fetal monitors at 0419. Will continue to monitor.

## 2016-08-26 NOTE — Progress Notes (Signed)
ACULTY PRACTICE ANTEPARTUM COMPREHENSIVE PROGRESS NOTE  Robin [name] is a 31 y.o. [phone] at 4834w1d  who is admitted for PPROM   Fetal presentation is cephalic. Length of Stay:  23  Days  Subjective: Pt had episode of some LOF and vaginal bleeding earlier this Am. Has now resolved. Denies ut ctx.   Vitals:  Blood pressure (!) 102/58, pulse 83, temperature 98 F (36.7 C), temperature source Oral, resp. rate 18, height 5\' 6"  (1.676 m), weight 53.5 kg (118 lb), last menstrual period 02/23/2015, SpO2 99 %, currently breastfeeding.   Physical Examination: Lungs clear  Heart RRR Abd soft + BS gravid non tender  Fetal Monitoring: 140's   Labs:  Results for orders placed or performed during the hospital encounter of 08/03/16 (from the past 24 hour(s))  Type and screen [facility] [place]   Collection Time: 08/25/16  9:50 PM  Result Value Ref Range   ABO/RH(D) O POS    Antibody Screen NEG    Sample Expiration 08/28/2016     Imaging Studies:    US 08/25/16: vertex, EFW 898 gms,  57%  AFI 5  Medications:  Scheduled . docusate sodium  100 mg Oral Daily  . RA GUMMY VITAMINS & MINERALS  6 tablet Oral Daily   I have reviewed the patient's current medications.  ASSESSMENT: IUP 26 1/7 weeks PPROM s/p BMZ and antibiotics  PLAN: No S/Sx of infection. Good interval growth. Delivery for S/SX of infection, PTL, fetal indications or at 34 weeks.  Hermina StaggersMichael L Dayson Aboud 08/26/2016,7:26 AM

## 2016-08-27 DIAGNOSIS — O42912 Preterm premature rupture of membranes, unspecified as to length of time between rupture and onset of labor, second trimester: Secondary | ICD-10-CM

## 2016-08-27 DIAGNOSIS — O26852 Spotting complicating pregnancy, second trimester: Secondary | ICD-10-CM

## 2016-08-27 DIAGNOSIS — Z3A26 26 weeks gestation of pregnancy: Secondary | ICD-10-CM

## 2016-08-27 DIAGNOSIS — O88112 Amniotic fluid embolism in pregnancy, second trimester: Secondary | ICD-10-CM

## 2016-08-27 NOTE — Progress Notes (Signed)
ACULTY PRACTICE ANTEPARTUM COMPREHENSIVE PROGRESS NOTE  Robin [name] is a 31 y.o. [phone] at 8947w2d who is admitted for PPROM   Fetal presentation is cephalic. Length of Stay:  24  Days  Subjective: Pt had episode of some LOF and vaginal bleeding earlier this Am. Has now resolved. Denies ut ctx.   Vitals:  Blood pressure 92/62, pulse 98, temperature 97.8 F (36.6 C), temperature source Oral, resp. rate 16, height 5\' 6"  (1.676 m), weight 118 lb (53.5 kg), last menstrual period 02/23/2015, SpO2 99 %, currently breastfeeding.   Physical Examination: Lungs clear  Heart RRR Abd soft + BS gravid non tender  Fetal Monitoring: 140's   Labs:  No results found for this or any previous visit (from the past 24 hour(s)).  Imaging Studies:    US 08/25/16: vertex, EFW 898 gms,  57%  AFI 5  Medications:  Scheduled . docusate sodium  100 mg Oral Daily  . RA GUMMY VITAMINS & MINERALS  6 tablet Oral Daily   I have reviewed the patient's current medications.  ASSESSMENT: IUP 7347w2d  weeks PPROM s/p BMZ and antibiotics  PLAN: No S/Sx of infection. Good interval growth. Delivery for S/SX of infection, PTL, fetal indications or at 34 weeks.  Lazaro ArmsEURE,Yaritzy Huser H 08/27/2016,2:52 PM Patient ID: Robin CarolLindsay C Segar, female   DOB: [birth year], 31 y.o.   MRN: [id number]

## 2016-08-28 LAB — TYPE AND SCREEN
ABO/RH(D): O POS
ANTIBODY SCREEN: NEGATIVE

## 2016-08-28 NOTE — Progress Notes (Signed)
ACULTY PRACTICE ANTEPARTUM COMPREHENSIVE PROGRESS NOTE  Robin [name] is a 31 y.o. [phone] at 6142w3d who is admitted for PPROM   Fetal presentation is cephalic. Length of Stay:  25 Days  Subjective: Still occasional pink stained fluid, no Cx, good FM  Blood pressure (!) 109/58, pulse 89, temperature 98 F (36.7 C), temperature source Oral, resp. rate 17, height 5\' 6"  (1.676 m), weight 53.5 kg (118 lb), last menstrual period 02/23/2015, SpO2 99 %, currently breastfeeding.   Physical Examination: Lungs clear    Heart RRR Abd soft + BS gravid non tender  Fetal Heart Rate A  Mode External filed at 08/27/2016 2250  Baseline Rate (A) 140 bpm filed at 08/27/2016 2250  Variability 6-25 BPM filed at 08/27/2016 2250  Accelerations 15 x 15 filed at 08/27/2016 2250  Decelerations None filed at 08/27/2016      Labs:  No results found for this or any previous visit (from the past 24 hour(s)).  Imaging Studies:    US 08/25/16: vertex, EFW 898 gms,  57%  AFI 5  Medications:  Scheduled . docusate sodium  100 mg Oral Daily  . RA GUMMY VITAMINS & MINERALS  6 tablet Oral Daily   I have reviewed the patient's current medications.  ASSESSMENT: IUP 1621w2d  weeks PPROM s/p BMZ and antibiotics  PLAN: No S/Sx of infection. Good interval growth. Delivery for S/SX of infection, PTL, fetal indications or at 34 weeks.  Adam PhenixJames G Treyshaun Keatts, MD 08/28/2016 6:02 AM

## 2016-08-29 NOTE — Progress Notes (Signed)
Patient ID: Robin [name], female   DOB: 1985-11-16, 31 y.o.   MRN: [id number] ACULTY PRACTICE ANTEPARTUM COMPREHENSIVE PROGRESS NOTE  Robin [name] is a 31 y.o. [id number] at 1429w4d  who is admitted for PROM.   Fetal presentation is cephalic. Length of Stay:  26  Days  Subjective: Pt reports continued LOF that is clear. Patient reports good fetal movement.  She reports no uterine contractions and no bleeding.   Vitals:  Blood pressure (!) 104/51, pulse (!) 103, temperature 97.1 F (36.2 C), temperature source Axillary, resp. rate 16, height 5\' 6"  (1.676 m), weight 118 lb (53.5 kg), last menstrual period 02/23/2015, SpO2 100 %, currently breastfeeding. Physical Examination: General appearance - alert, well appearing, and in no distress Abdomen - soft, nontender, nondistended, no masses or organomegaly gravid Extremities - peripheral pulses normal, no pedal edema, no clubbing or cyanosis Cervical Exam: Not evaluated Membranes:ruptured  Fetal Monitoring:  Baseline: 140's bpm, Variability: Good {> 6 bpm), Accelerations: Reactive, Decelerations: Absent and TOCO: no ctx  Labs:  Results for orders placed or performed during the hospital encounter of 08/03/16 (from the past 24 hour(s))  Type and screen [facility]   Collection Time: 08/28/16  9:36 PM  Result Value Ref Range   ABO/RH(D) O POS    Antibody Screen NEG    Sample Expiration 08/31/2016     Imaging Studies:    None pending  Medications:  Scheduled . docusate sodium  100 mg Oral Daily  . RA GUMMY VITAMINS & MINERALS  6 tablet Oral Daily   I have reviewed the patient's current medications.  ASSESSMENT: Patient Active Problem List   Diagnosis Date Noted  . Premature rupture of membranes in second trimester   . Vaginal bleeding   . Dysuria   . Preterm premature rupture of membranes (PPROM) with unknown onset of labor 08/03/2016  . Oligohydramnios antepartum, second trimester, fetus 1 07/20/2016  . GBS  (group B Streptococcus carrier), +RV culture, currently pregnant 05/12/2016  . Supervision of other normal pregnancy, antepartum 05/25/2015  . Family hx of hydrocephalus 04/03/2012  . Family hx of bicuspid aortic valve--FOB 04/03/2012  . History of abnormal Pap smear 01/02/2012  . history of genital warts 12/26/2011  . History of smoking 12/26/2011    PLAN: #1 IUP 26/ 4/7 weeks #2 PPROM/SCH/Abruption             S/p antibiotics             Second course of BMZ completed 1/26                      U/S for growth in 3 weeks if undel             Bleeding precautions reviewed             Reassuring FHT             No s/sx of infection. Continue with current management Continue routine antenatal care.  Bush Murdoch Harraway-Smith 08/29/2016,10:15 AM

## 2016-08-29 NOTE — Progress Notes (Signed)
Brief follow up with Robin AbedLindsay in the elevator.  She was excited to accompany her friend on her ultrasound and grateful to be at 26+ weeks.  When I returned to visit after her friend had left, she was napping.  Will continue to follow.  Please page as further needs arise.  [name], M.Div. [facility] Pager [phone] Office [phone]

## 2016-08-30 NOTE — Progress Notes (Signed)
Patient ID: Robin CarolLindsay C Staff, female   DOB: [birth year], 31 y.o.   MRN: [id number] ACULTY PRACTICE ANTEPARTUM COMPREHENSIVE PROGRESS NOTE  Robin [name] is a 31 y.o. [id number] at 3668w5d  who is admitted for PROM.   Fetal presentation is cephalic. Length of Stay:  27  Days  Subjective: No new complaints Patient reports good fetal movement.  She reports no uterine contractions and no bleeding. She reports continued loss of fluid per vagina.  Vitals:  Blood pressure 107/64, pulse 97, temperature 98.6 F (37 C), temperature source Oral, resp. rate 16, height 5\' 6"  (1.676 m), weight 118 lb (53.5 kg), last menstrual period 02/23/2015, SpO2 100 %, currently breastfeeding. Physical Examination: General appearance - alert, well appearing, and in no distress Abdomen - soft, nontender, nondistended, no masses or organomegaly gravid Cervical Exam: Not evaluated.  Extremities: extremities normal, atraumatic, no cyanosis or edema  Membranes:ruptured, clear fluid  Fetal Monitoring:  Baseline: 150's bpm, Variability: Good {> 6 bpm), Accelerations: Reactive, Decelerations: Absent and TOCO: no ctx  Labs:  No results found for this or any previous visit (from the past 24 hour(s)).  Imaging Studies:    None pending   Medications:  Scheduled . docusate sodium  100 mg Oral Daily  . RA GUMMY VITAMINS & MINERALS  6 tablet Oral Daily   I have reviewed the patient's current medications.  ASSESSMENT: Patient Active Problem List   Diagnosis Date Noted  . Premature rupture of membranes in second trimester   . Vaginal bleeding   . Dysuria   . Preterm premature rupture of membranes (PPROM) with unknown onset of labor 08/03/2016  . Oligohydramnios antepartum, second trimester, fetus 1 07/20/2016  . GBS (group B Streptococcus carrier), +RV culture, currently pregnant 05/12/2016  . Supervision of other normal pregnancy, antepartum 05/25/2015  . Family hx of hydrocephalus 04/03/2012  . Family hx of bicuspid  aortic valve--FOB 04/03/2012  . History of abnormal Pap smear 01/02/2012  . history of genital warts 12/26/2011  . History of smoking 12/26/2011    PLAN: #1 IUP 26/ 5/7 weeks #2 PPROM/SCH/Abruption S/p antibiotics Second course of BMZ completed 1/26 U/S for growth in 3 weeks if undel Bleeding precautions reviewed Reassuring FHT No s/sx of infection. Continue with current management Continue routine antenatal care.   Shahab Polhamus Harraway-Smith 08/30/2016,6:45 AM

## 2016-08-31 DIAGNOSIS — Z3A26 26 weeks gestation of pregnancy: Secondary | ICD-10-CM

## 2016-08-31 LAB — TYPE AND SCREEN
ABO/RH(D): O POS
ANTIBODY SCREEN: NEGATIVE

## 2016-08-31 LAB — GLUCOSE, FASTING: GLUCOSE, FASTING: 83 mg/dL (ref 65–99)

## 2016-08-31 LAB — GLUCOSE TOLERANCE, 1 HOUR: Glucose, 1 Hour GTT: 128 mg/dL (ref 70–140)

## 2016-08-31 LAB — GLUCOSE, 2 HOUR: GLUCOSE, 2 HOUR: 103 mg/dL (ref 70–139)

## 2016-08-31 NOTE — Progress Notes (Addendum)
Patient ID: Robin [name], female   DOB: 1986-01-06, 31 y.o.   MRN: [id number]  Robin CarolLindsay C Dula is a 31 y.o. [id number] at 2038w4d  who is admitted for PROM Fetal presentation is cephalic. Length of Stay:  28  Days  Subjective: Spotting throughout the day  No contractions.  Good FM.   Vitals:  Blood pressure (!) 103/58, pulse 88, temperature 98.6 F (37 C), temperature source Oral, resp. rate 16, height 5\' 6"  (1.676 m), weight 118 lb (53.5 kg), last menstrual period 02/23/2015, SpO2 100 %, currently breastfeeding.   Physical Examination: BP (!) 103/58 (BP Location: Right Arm)   Pulse 88   Temp 98.6 F (37 C) (Oral)   Resp 16   Ht 5\' 6"  (1.676 m)   Wt 118 lb (53.5 kg)   LMP 02/23/2015 (Exact Date)   SpO2 100%   BMI 19.05 kg/m  Gen NAD Lungs clear Heart RRR Abd soft + BS gravid nontender Ext non tender Pelvic: Scant blood on pad MSK: Tenderness, muscle spasm on right and left lower lumbar paraspinals. Neuro: gait normal.  OSE: L5 ESRR, L/L torsion, R ant innom.   Fetal Monitoring: NSTx3:  Baseline: 140 bpm, 10x10 accelerations, occasional variable,  +moderate variability,   Labs:  No results found for this or any previous visit (from the past 24 hour(s)).  Imaging Studies:   Impression  Singleton intrauterine pregnancy 25 2/7 weeks with pPROM  on follow up songographic evaluation (remote read only):  active singleton fetus  patient report of bleeding is consistent with a subchorionic  heterogeneous collection measuring 3.4 x 2.9 x 4.5 cm.  Adjacent clot visualized, images suggest placental abruption.  oligohydramnios  placenta is implanted along the uterine fundus without previa  Medications:  Scheduled . docusate sodium  100 mg Oral Daily  . RA GUMMY VITAMINS & MINERALS  6 tablet Oral Daily   I have reviewed the patient's current medications.  ASSESSMENT AND PLAN: #1 IUP 25 4/7 weeks  2hr GTT ordered #2 PPROM/SCH/Abruption  S/p antibiotics  Second course of  BMZ completed 1/26              U/S for growth due 26 weeks             Bleeding precautions reviewed  Reassuring FHT  No s/sx of infection. Continue with current management   Levie HeritageJacob J Makail Watling, DO 08/31/2016,8:06 AM

## 2016-08-31 NOTE — Progress Notes (Signed)
Pt has completed drinking solution for 2 hour GTT.

## 2016-09-01 NOTE — Progress Notes (Signed)
Notified Dr. Vergie LivingPickens of small amount of bleeding this morning. No Further.

## 2016-09-01 NOTE — Progress Notes (Signed)
Called Dr. Vergie LivingPickens back, patient reports maroon trickle when laughing. MD requested to place baby to monitor and call in 30 minutes.

## 2016-09-01 NOTE — Progress Notes (Signed)
Patient ID: Robin [name], female   DOB: 1986/01/08, 31 y.o.   MRN: [id number]  Robin [name] is a 31 y.o. [id number] at 670w0d who is admitted for PPROM Fetal presentation is cephalic. Length of Stay:  29  Days  Subjective: No reported spotting yesterday.  No contractions.  Good FM.   Vitals:  Blood pressure (!) 105/56, pulse 92, temperature 97.6 F (36.4 C), temperature source Oral, resp. rate 18, height 5\' 6"  (1.676 m), weight 118 lb (53.5 kg), last menstrual period 02/23/2015, SpO2 99 %, currently breastfeeding.   Physical Examination: Gen NAD Lungs clear Heart RRR Abd soft + BS gravid nontender Ext non tender Pelvic: Scant blood on pad MSK: Tenderness, muscle spasm on right and left lower lumbar paraspinals. Neuro: gait normal.  OSE: L5 ESRR, L/L torsion, R ant innom.   Fetal Monitoring: NSTx3:  Baseline: 140 bpm, 10x10 accelerations, occasional variable,  +moderate variability,   Labs:  Results for orders placed or performed during the hospital encounter of 08/03/16 (from the past 24 hour(s))  Glucose, fasting   Collection Time: 08/31/16  8:36 AM  Result Value Ref Range   Glucose, Fasting 83 65 - 99 mg/dL  Glucose tolerance, 1 hour   Collection Time: 08/31/16 10:40 AM  Result Value Ref Range   Glucose, 1 Hour GTT 128 70 - 140 mg/dL  [facility]   Collection Time: 08/31/16 10:40 AM  Result Value Ref Range   ABO/RH(D) O POS    Antibody Screen NEG    Sample Expiration 09/03/2016   Glucose, 2 hour   Collection Time: 08/31/16 11:58 AM  Result Value Ref Range   Glucose, 2 hour 103 70 - 139 mg/dL    Imaging Studies:   Impression  Singleton intrauterine pregnancy 25 2/7 weeks with pPROM  on follow up songographic evaluation (remote read only):  active singleton fetus  patient report of bleeding is consistent with a subchorionic  heterogeneous collection measuring 3.4 x 2.9 x 4.5 cm.  Adjacent clot visualized, images suggest  placental abruption.  oligohydramnios  placenta is implanted along the uterine fundus without previa  Medications:  Scheduled . docusate sodium  100 mg Oral Daily  . RA GUMMY VITAMINS & MINERALS  6 tablet Oral Daily   I have reviewed the patient's current medications.  ASSESSMENT AND PLAN: #1 IUP 6770w0d weeks  Normal 2 hr GTT yesterday #2 PPROM/SCH/Abruption  S/p antibiotics  Second course of BMZ completed 1/26              U/S for growth due 28-29 weeks             Bleeding precautions reviewed  Reassuring FHT  No s/sx of infection.  Continue with current management   Tereso NewcomerUgonna A Ramani Riva, MD 09/01/2016,7:16 AM

## 2016-09-02 DIAGNOSIS — Z3A27 27 weeks gestation of pregnancy: Secondary | ICD-10-CM

## 2016-09-02 DIAGNOSIS — O42912 Preterm premature rupture of membranes, unspecified as to length of time between rupture and onset of labor, second trimester: Secondary | ICD-10-CM

## 2016-09-02 NOTE — Progress Notes (Addendum)
Daily Antepartum Note  Admission Date: 08/03/2016 Current Date: 09/02/2016 6:37 AM  Robin [name] is a 31 y.o. [id number] @ 4548w1d, HD#31, admitted for PPROM.  Pregnancy complicated by: Chronic oligo vs PPROM (18wks) and SCH/abruption, GBS pos  Overnight/24hr events:  none  Subjective:  No s/s of PTL or infection. Spotting first thing in the AM with rising but none since.   Objective:    Current Vital Signs 24h Vital Sign Ranges  T 98 F (36.7 C) Temp  Avg: 98.1 F (36.7 C)  Min: 97.9 F (36.6 C)  Max: 98.4 F (36.9 C)  BP 101/62 BP  Min: 93/51  Max: 109/65  HR 93 Pulse  Avg: 93.8  Min: 88  Max: 98  RR 18 Resp  Avg: 17.8  Min: 16  Max: 18  SaO2 97 % Not Delivered SpO2  Avg: 98 %  Min: 97 %  Max: 99 %       24 Hour I/O Current Shift I/O  Time Ins Outs No intake/output data recorded. No intake/output data recorded.   140 baselne, +accels, no decel, mod variability Toco: quiet  Physical exam: General: Well nourished, well developed female in no acute distress. Abdomen: gravid , NTTP Respiratory: no respiratory distress Extremities: no clubbing, cyanosis or edema Skin: Warm and dry.   Medications: Current Facility-Administered Medications  Medication Dose Route Frequency Provider Last Rate Last Dose  . acetaminophen (TYLENOL) tablet 650 mg  650 mg Oral Q4H PRN Lorne SkeensNicholas Michael Schenk, MD   650 mg at 08/31/16 1550  . bisacodyl (DULCOLAX) EC tablet 10 mg  10 mg Oral Daily PRN Levie HeritageJacob J Stinson, DO   10 mg at 08/20/16 1126  . calcium carbonate (TUMS - dosed in mg elemental calcium) chewable tablet 400 mg of elemental calcium  2 tablet Oral Q4H PRN Lorne SkeensNicholas Michael Schenk, MD      . diphenhydrAMINE (BENADRYL) capsule 25 mg  25 mg Oral Q6H PRN [place] Bingharlie Dameka Younker, MD      . docusate sodium (COLACE) capsule 100 mg  100 mg Oral Daily Lorne SkeensNicholas Michael Schenk, MD   100 mg at 09/01/16 1001  . Doxylamine-Pyridoxine 10-10 MG TBEC 1 tablet  1 tablet Oral QHS PRN Catalina AntiguaPeggy Constant, MD   1 tablet  at 08/20/16 2145  . ondansetron (ZOFRAN-ODT) disintegrating tablet 8 mg  8 mg Oral Q8H PRN [place] Bingharlie Makenlee Mckeag, MD      . pyridOXINE (VITAMIN B-6) tablet 50 mg  50 mg Oral Q8H PRN [place] Bingharlie Jemaine Prokop, MD      . RA GUMMY VITAMINS & MINERALS CHEW 6 tablet  6 tablet Oral Daily [place] Bingharlie Daney Moor, MD   6 tablet at 09/01/16 1001  . zolpidem (AMBIEN) tablet 5 mg  5 mg Oral QHS PRN Lorne SkeensNicholas Michael Schenk, MD        Labs:  No new labs  Radiology:  2/1: 57%, 896gm, AFI 5.2, normal AC, cephalic 1/27: cephalic, oligo, 3cm abruption 1/12: ceph, oligo, 533gm, 46%, normal AC and cx length; SCH not mentioned.  12/15: 54%, 273gm, oligo (anatomy scan)  Assessment & Plan:  Patient stable *Pregnancy: fetal status reassuring. qday NST. CBC, rpr, hiv in one week. -s/p normal 2hr GTT on 2/7 *PPROM: repeat growth scan late February -s/p BMZ course on admission and rescue course on 1/25-26 -s/p NICU consult -s/p latency abx -declined genetic screening during the pregnancy.  *PPx:  SCDs, bedrest *FEN/GI: regular diet *Dispo: delivery with infection, PTL, non reassuring maternal/fetal status, 34wks.   Cornelia Copaharlie Lc Joynt, Jr. MD Attending  Center for [employer])

## 2016-09-03 DIAGNOSIS — O42912 Preterm premature rupture of membranes, unspecified as to length of time between rupture and onset of labor, second trimester: Secondary | ICD-10-CM

## 2016-09-03 DIAGNOSIS — Z3A27 27 weeks gestation of pregnancy: Secondary | ICD-10-CM

## 2016-09-03 NOTE — Progress Notes (Signed)
FACULTY PRACTICE ANTEPARTUM(COMPREHENSIVE) NOTE  Robin [name] is a 31 y.o. [id number] at 30 [redacted]w[redacted]d  who is admitted for PROM. And subchorionic hemorrhage  Fetal presentation is cephalic. Length of Stay:  31  Days  Subjective: No complaints Patient reports the fetal movement as active. Patient reports uterine contraction  activity as none. Patient reports  vaginal bleeding as none. Patient describes fluid per vagina as Other lite pink in d/c yesterday, not contracting.  Vitals:  Blood pressure (!) 91/52, pulse 100, temperature 98.2 F (36.8 C), temperature source Oral, resp. rate 16, height 5\' 6"  (1.676 m), weight 53.5 kg (118 lb), last menstrual period 02/23/2015, SpO2 100 %, currently breastfeeding. Physical Examination:  General appearance - alert, well appearing, and in no distress, oriented to person, place, and time, normal appearing weight and well hydrated Heart - normal rate and regular rhythm Abdomen - soft, nontender, nondistended Fundal Height:  U+3 cm, deviated to pt left nontender Cervical Exam: Not evaluated. an Extremities: extremities normal, atraumatic, no cyanosis or edema and Homans sign is negative, no sign of DVT with DTRs 2+ bilaterally Membranes:intact, ruptured, lite pink by pt report yesterday  Fetal Monitoring:  Baseline: 140 bpm reactive, no decels or contractions  Labs:  No results found for this or any previous visit (from the past 24 hour(s)).  Imaging Studies:     Currently EPIC will not allow sonographic studies to automatically populate into notes.  In the meantime, copy and paste results into note or free text.  Medications:  Scheduled . docusate sodium  100 mg Oral Daily  . RA GUMMY VITAMINS & MINERALS  6 tablet Oral Daily   I have reviewed the patient's current medications.  ASSESSMENT: Patient Active Problem List   Diagnosis Date Noted  . Premature rupture of membranes in second trimester   . Vaginal bleeding   . Dysuria   . Preterm  premature rupture of membranes (PPROM) with unknown onset of labor 08/03/2016  . Oligohydramnios antepartum, second trimester, fetus 1 07/20/2016  . GBS (group B Streptococcus carrier), +RV culture, currently pregnant 05/12/2016  . Supervision of other normal pregnancy, antepartum 05/25/2015  . Family hx of hydrocephalus 04/03/2012  . Family hx of bicuspid aortic valve--FOB 04/03/2012  . History of abnormal Pap smear 01/02/2012  . history of genital warts 12/26/2011  . History of smoking 12/26/2011    PLAN:  Assessment & Plan:  Patient stable *Pregnancy: fetal status reassuring. qday NST. CBC, rpr, hiv in one week. -s/p normal 2hr GTT on 2/7 *PPROM: repeat growth scan late February -s/p BMZ course on admission and rescue course on 1/25-26 -s/p NICU consult -s/p latency abx -declined genetic screening during the pregnancy.  *PPx:  SCDs, bedrest *FEN/GI: regular diet *Dispo: delivery with infection, PTL, non reassuring maternal/fetal status, 34wks.     Tilda BurrowFERGUSON,Rexanna Louthan V 09/03/2016,6:46 AM    Patient ID: Robin CarolLindsay C Yust, female   DOB: May 20, 1986, 31 y.o.   MRN: [id number]

## 2016-09-03 NOTE — Progress Notes (Signed)
Baby very active. Mom drinking a [place] drink.

## 2016-09-03 NOTE — Progress Notes (Signed)
Pt passed a small brown 1 cm by 1 cm clot.   Pt reports leaking clear fld moderate amt.  Reports good fetal movement.

## 2016-09-04 LAB — TYPE AND SCREEN
ABO/RH(D): O POS
Antibody Screen: NEGATIVE

## 2016-09-04 NOTE — Progress Notes (Addendum)
Patient ID: Robin [name], female   DOB: January 17, 1986, 31 y.o.   MRN: [id number] FACULTY PRACTICE ANTEPARTUM(COMPREHENSIVE) NOTE  Robin [name] is a 31 y.o. [id number] at 5857w3d by best clinical estimate who is admitted for rupture of membranes.   Fetal presentation is cephalic. Length of Stay:  32  Days  Subjective: Leaking more fluid yesterday but was up visiting her kids. Passed some very old dark blood yesterday,  Patient reports the fetal movement as active. Patient reports uterine contraction  activity as none. Patient reports  vaginal bleeding as none. Patient describes fluid per vagina as None.  Vitals:  Blood pressure (!) 97/55, pulse 83, temperature 98.5 F (36.9 C), temperature source Oral, resp. rate 18, height 5\' 6"  (1.676 m), weight 118 lb (53.5 kg), last menstrual period 02/23/2015, SpO2 100 %, currently breastfeeding. Physical Examination:  General appearance - alert, well appearing, and in no distress Chest - normal effort Abdomen - gravid, NT Fundal Height:  size less than dates Extremities: Homans sign is negative, no sign of DVT  Membranes:ruptured, clear fluid  Fetal Monitoring:  Baseline: 140 bpm, Variability: Good {> 6 bpm), Accelerations: Reactive and Decelerations: Variable: mild   Medications:  Scheduled . docusate sodium  100 mg Oral Daily  . RA GUMMY VITAMINS & MINERALS  6 tablet Oral Daily   I have reviewed the patient's current medications.  ASSESSMENT: Principal Problem:   Preterm premature rupture of membranes (PPROM) with unknown onset of labor Active Problems:   Vaginal bleeding   Dysuria   Premature rupture of membranes in second trimester   PLAN: Passed glucola No s/sx's of infection, delivery with such  Reva Boresanya S Selig [place]ole, MD 09/04/2016,7:44 AM

## 2016-09-05 ENCOUNTER — Encounter (HOSPITAL_COMMUNITY): Payer: Self-pay | Admitting: *Deleted

## 2016-09-05 NOTE — Progress Notes (Signed)
Patient ID: Robin CarolLindsay C Auble, female   DOB: 1985/11/27, 31 y.o.   MRN: [id number] FACULTY PRACTICE ANTEPARTUM(COMPREHENSIVE) NOTE  Robin [name] is a 31 y.o. [id number] at 2571w4d by best clinical estimate who is admitted for rupture of membranes.   Fetal presentation is cephalic. Length of Stay:  33  Days  Subjective: Doing well this am Patient reports the fetal movement as active. Patient reports uterine contraction  activity as none. Patient reports  vaginal bleeding as none. Patient describes fluid per vagina as Clear.  Vitals:  Blood pressure (!) 99/45, pulse 70, temperature 98 F (36.7 C), temperature source Oral, resp. rate 16, height 5\' 6"  (1.676 m), weight 118 lb (53.5 kg), last menstrual period 02/23/2015, SpO2 99 %, currently breastfeeding. Physical Examination:  General appearance - alert, well appearing, and in no distress Chest - normal effort Abdomen - gravid, NT Fundal Height:  size equals dates Extremities: Homans sign is negative, no sign of DVT  Membranes:ruptured, clear fluid  Fetal Monitoring:  Baseline: 140 bpm, Variability: Good {> 6 bpm), Accelerations: Reactive and Decelerations: Absent x 3  Labs:  Results for orders placed or performed during the hospital encounter of 08/03/16 (from the past 24 hour(s))  [facility]   Collection Time: 09/04/16 10:42 AM  Result Value Ref Range   ABO/RH(D) O POS    Antibody Screen NEG    Sample Expiration 09/07/2016      Medications:  Scheduled . docusate sodium  100 mg Oral Daily  . RA GUMMY VITAMINS & MINERALS  6 tablet Oral Daily   I have reviewed the patient's current medications.  ASSESSMENT: Principal Problem:   Preterm premature rupture of membranes (PPROM) with unknown onset of labor Active Problems:   Vaginal bleeding   Dysuria   Premature rupture of membranes in second trimester   PLAN: Continue inpt. Monitoring Delivery with s/sx's of chorio  Reva Boresanya S Pratt,  MD 09/05/2016,7:36 AM

## 2016-09-06 DIAGNOSIS — Z3A27 27 weeks gestation of pregnancy: Secondary | ICD-10-CM

## 2016-09-06 DIAGNOSIS — O42912 Preterm premature rupture of membranes, unspecified as to length of time between rupture and onset of labor, second trimester: Secondary | ICD-10-CM

## 2016-09-06 LAB — CBC
HCT: 30.3 % — ABNORMAL LOW (ref 36.0–46.0)
Hemoglobin: 10 g/dL — ABNORMAL LOW (ref 12.0–15.0)
MCH: 29 pg (ref 26.0–34.0)
MCHC: 33 g/dL (ref 30.0–36.0)
MCV: 87.8 fL (ref 78.0–100.0)
Platelets: 257 10*3/uL (ref 150–400)
RBC: 3.45 MIL/uL — ABNORMAL LOW (ref 3.87–5.11)
RDW: 13 % (ref 11.5–15.5)
WBC: 8 10*3/uL (ref 4.0–10.5)

## 2016-09-06 MED ORDER — RA GUMMY VITAMINS & MINERALS PO CHEW
6.0000 | CHEWABLE_TABLET | Freq: Every day | ORAL | Status: DC
  Administered 2016-09-07 – 2016-09-09 (×3): 6 via ORAL

## 2016-09-06 NOTE — Evaluation (Signed)
Physical Therapy Evaluation Patient Details Name: Robin CarolLindsay C Federer MRN: [id number] DOB: 28-Dec-1985 Today's Date: 09/06/2016   History of Present Illness  Pt adm with premature rupture of membranes with pregnancy at 22 wks 6 days. Pt on modified bedrest and is now at 27 wks 5 days.  Clinical Impression  Pt presents to PT with extended modified bed rest due to PROM. Pt demonstrated good understanding of exercises and body mechanics. Also reviewed placing towel roll between shoulder blades for thoracic stretch and standing trunk extension when up to bathroom.    Follow Up Recommendations No PT follow up    Equipment Recommendations  None recommended by PT    Recommendations for Other Services       Precautions / Restrictions Precautions Precautions: None      Mobility  Bed Mobility Overal bed mobility: Modified Independent             General bed mobility comments: Observed pt rolling independently. Pt reports up without difficulty. Instructed in use of good body mechanics to decr use of abdominals.  Transfers                    Ambulation/Gait                Stairs            Wheelchair Mobility    Modified Rankin (Stroke Patients Only)       Balance                                             Pertinent Vitals/Pain Pain Assessment: No/denies pain    Home Living Family/patient expects to be discharged to:: Private residence Living Arrangements: Spouse/significant other;Children                    Prior Function Level of Independence: Independent               Hand Dominance        Extremity/Trunk Assessment                Communication   Communication: No difficulties  Cognition Arousal/Alertness: Awake/alert Behavior During Therapy: WFL for tasks assessed/performed Overall Cognitive Status: Within Functional Limits for tasks assessed                      General Comments       Exercises Antenatal Exercises Ankle Circles/Pumps: AROM;Both;5 reps;Supine Quad Sets: Strengthening;Both;5 reps;Supine Gluteal Sets: Strengthening;Both;5 reps;Supine Short Arc Quad: Strengthening;Both;5 reps;Supine Hip ABduction/ADduction: Strengthening;Both;5 reps;Supine Sidelying Hip Flexor Stretch: PROM;Both;5 reps   Assessment/Plan    PT Assessment Patent does not need any further PT services  PT Problem List            PT Treatment Interventions      PT Goals (Current goals can be found in the Care Plan section)  Acute Rehab PT Goals PT Goal Formulation: All assessment and education complete, DC therapy    Frequency     Barriers to discharge        Co-evaluation               End of Session   Activity Tolerance: Patient tolerated treatment well Patient left: in bed;with call bell/phone within reach Nurse Communication: Other (comment) (Exercise program given)         Time:  1610-9604 PT Time Calculation (min) (ACUTE ONLY): 17 min   Charges:   PT Evaluation $PT Eval Low Complexity: 1 Procedure     PT G CodesAngelina Ok [facility] September 27, 2016, 3:31 PM [facility] PT [phone]

## 2016-09-06 NOTE — Progress Notes (Signed)
ACULTY PRACTICE ANTEPARTUM COMPREHENSIVE PROGRESS NOTE  Robin [name] is a 31 y.o. [phone] at 4462w5d  who is admitted for PROM Length of Stay:  34  Days  Subjective: Pt reports feeling a little nasuated this morning. Denies ut ctx. Reports + fetal movement   Vitals:  Blood pressure (!) 104/50, pulse 76, temperature 98.4 F (36.9 C), temperature source Oral, resp. rate 18, height 5\' 6"  (1.676 m), weight 118 lb (53.5 kg), last menstrual period 02/23/2015, SpO2 98 %, currently breastfeeding.   Physical Examination: Lungs clear Heart RRR Abd soft + BS gravid non tender Ext non tender  Fetal Monitoring:  Baseline: 140's bpm, no decelerations, reactive for gestational age,   Labs:  No results found for this or any previous visit (from the past 24 hour(s)).  Imaging Studies:    NA   Medications:  Scheduled . docusate sodium  100 mg Oral Daily  . RA GUMMY VITAMINS & MINERALS  6 tablet Oral Daily   I have reviewed the patient's current medications.  ASSESSMENT: IUP 27 5/7 weeks PROM s/p BMZ   PLAN: Stable. No S/Sx of infection. Continue with current management  Hermina StaggersMichael L Zekiel Torian 09/06/2016,7:44 AM

## 2016-09-07 LAB — HIV ANTIBODY (ROUTINE TESTING W REFLEX): HIV Screen 4th Generation wRfx: NONREACTIVE

## 2016-09-07 LAB — TYPE AND SCREEN
ABO/RH(D): O POS
Antibody Screen: NEGATIVE

## 2016-09-07 LAB — RPR: RPR Ser Ql: NONREACTIVE

## 2016-09-07 NOTE — Progress Notes (Signed)
Patient ID: Robin [name], female   DOB: January 27, 1986, 31 y.o.   MRN: [id number] ACULTY PRACTICE ANTEPARTUM COMPREHENSIVE PROGRESS NOTE  Robin CarolLindsay C Schindel is a 31 y.o. [id number] at 2057w6d  who is admitted for PROM.   Fetal presentation is cephalic. Length of Stay:  35  Days  Subjective: Pt reports continued LOF o/w no abnormal sx Patient reports good fetal movement.  She reports no uterine contractions, no bleeding.  Vitals:  Blood pressure (!) 102/57, pulse 89, temperature 98.4 F (36.9 C), temperature source Oral, resp. rate 16, height 5\' 6"  (1.676 m), weight 118 lb (53.5 kg), last menstrual period 02/23/2015, SpO2 100 %, currently breastfeeding. Physical Examination: General appearance - alert, well appearing, and in no distress and gravid Abdomen - soft, nontender, nondistended, no masses or organomegaly Cervical Exam: Not evaluated.  Extremities: extremities normal, atraumatic, no cyanosis or edema  Membranes:ruptured  Fetal Monitoring:  Baseline: 140's bpm, Variability: Good {> 6 bpm), Accelerations: Reactive, Decelerations: Absent and TOCO: rare ctx  Labs:  Results for orders placed or performed during the hospital encounter of 08/03/16 (from the past 24 hour(s))  CBC   Collection Time: 09/06/16 11:28 AM  Result Value Ref Range   WBC 8.0 4.0 - 10.5 K/uL   RBC 3.45 (L) 3.87 - 5.11 MIL/uL   Hemoglobin 10.0 (L) 12.0 - 15.0 g/dL   HCT 14.730.3 (L) 82.936.0 - 56.246.0 %   MCV 87.8 78.0 - 100.0 fL   MCH 29.0 26.0 - 34.0 pg   MCHC 33.0 30.0 - 36.0 g/dL   RDW 13.013.0 86.511.5 - 78.415.5 %   Platelets 257 150 - 400 K/uL  RPR   Collection Time: 09/06/16 11:28 AM  Result Value Ref Range   RPR Ser Ql Non Reactive Non Reactive  HIV antibody   Collection Time: 09/06/16 11:28 AM  Result Value Ref Range   HIV Screen 4th Generation wRfx Non Reactive Non Reactive    Imaging Studies:    None pending   Medications:  Scheduled . docusate sodium  100 mg Oral Daily  . RA GUMMY VITAMINS & MINERALS  6 tablet Oral  Daily   I have reviewed the patient's current medications.  ASSESSMENT: Patient Active Problem List   Diagnosis Date Noted  . Premature rupture of membranes in second trimester   . Vaginal bleeding   . Dysuria   . Preterm premature rupture of membranes (PPROM) with unknown onset of labor 08/03/2016  . Oligohydramnios antepartum, second trimester, fetus 1 07/20/2016  . GBS (group B Streptococcus carrier), +RV culture, currently pregnant 05/12/2016  . Supervision of other normal pregnancy, antepartum 05/25/2015  . Family hx of hydrocephalus 04/03/2012  . Family hx of bicuspid aortic valve--FOB 04/03/2012  . History of abnormal Pap smear 01/02/2012  . history of genital warts 12/26/2011  . History of smoking 12/26/2011    PLAN: No s/sx of chorio Deliver for maternal or fetal indications Continue routine antenatal care.   Eber JonesCarolyn Harraway-Smith 09/07/2016,7:25 AM

## 2016-09-08 NOTE — Progress Notes (Signed)
FACULTY PRACTICE ANTEPARTUM(COMPREHENSIVE) NOTE  Robin [name] is a 31 y.o. [phone] at 3727w0d by early ultrasound who is admitted for PROM that has persisted since 18 wk .   Fetal presentation is unsure. Length of Stay:  36  Days  Subjective: Pt continues to leak small amounts of fluid daily Patient reports the fetal movement as active. Patient reports uterine contraction  activity as none. Patient reports  vaginal bleeding as none. Patient describes fluid per vagina as Clear. She has a small amount of d/c each morning, one pad a day, none the rest of day  Vitals:  Blood pressure (!) 96/53, pulse 82, temperature 98.3 F (36.8 C), temperature source Oral, resp. rate 18, height 5\' 6"  (1.676 m), weight 53.5 kg (118 lb), last menstrual period 02/23/2015, SpO2 100 %, currently breastfeeding. Physical Examination:  General appearance - alert, well appearing, and in no distress and normal appearing weight Heart - normal rate and regular rhythm Abdomen - soft, nontender, nondistended Fundal Height:  size equals dates Cervical Exam: Not evaluated. And Membranes:ruptured  Fetal Monitoring:  Personally reviewed by me from 5 pm Baseline 135 with excellent btb., no decels.  Labs:  No results found for this or any previous visit (from the past 24 hour(s)).  Imaging Studies:     last growth u/s 2/1 for growth u/s next week   Medications:  Scheduled . docusate sodium  100 mg Oral Daily  . RA GUMMY VITAMINS & MINERALS  6 tablet Oral Daily   I have reviewed the patient's current medications.  ASSESSMENT: Patient Active Problem List   Diagnosis Date Noted  . Premature rupture of membranes in second trimester   . Vaginal bleeding   . Dysuria   . Preterm premature rupture of membranes (PPROM) with unknown onset of labor 08/03/2016  . Oligohydramnios antepartum, second trimester, fetus 1 07/20/2016  . GBS (group B Streptococcus carrier), +RV culture, currently pregnant 05/12/2016  .  Supervision of other normal pregnancy, antepartum 05/25/2015  . Family hx of hydrocephalus 04/03/2012  . Family hx of bicuspid aortic valve--FOB 04/03/2012  . History of abnormal Pap smear 01/02/2012  . history of genital warts 12/26/2011  . History of smoking 12/26/2011    PLAN: No s/sx of chorio Deliver for maternal or fetal indications Continue routine antenatal care. [facility] not seen as order yet.  Tilda BurrowFERGUSON,Alorah Mcree V 09/08/2016,4:49 AM    Patient ID: Robin CarolLindsay C Woodroof, female   DOB: [birth year], 31 y.o.   MRN: [id number]

## 2016-09-09 ENCOUNTER — Encounter (HOSPITAL_COMMUNITY)
Admission: AD | Disposition: A | Discharge status: Home / Self Care | Payer: Self-pay | Source: Ambulatory Visit | Attending: Family Medicine

## 2016-09-09 ENCOUNTER — Inpatient Hospital Stay (HOSPITAL_COMMUNITY): Payer: 59 | Admitting: Anesthesiology

## 2016-09-09 ENCOUNTER — Encounter (HOSPITAL_COMMUNITY): Payer: Self-pay | Admitting: Anesthesiology

## 2016-09-09 DIAGNOSIS — Z3A38 38 weeks gestation of pregnancy: Secondary | ICD-10-CM

## 2016-09-09 DIAGNOSIS — O4593 Premature separation of placenta, unspecified, third trimester: Secondary | ICD-10-CM

## 2016-09-09 DIAGNOSIS — O42113 Preterm premature rupture of membranes, onset of labor more than 24 hours following rupture, third trimester: Secondary | ICD-10-CM

## 2016-09-09 LAB — DIC (DISSEMINATED INTRAVASCULAR COAGULATION)PANEL
INR: 1.01
Smear Review: NONE SEEN
aPTT: 29 seconds (ref 24–36)

## 2016-09-09 LAB — CBC
HEMATOCRIT: 30.1 % — AB (ref 36.0–46.0)
HEMOGLOBIN: 10.3 g/dL — AB (ref 12.0–15.0)
MCH: 29.6 pg (ref 26.0–34.0)
MCHC: 34.2 g/dL (ref 30.0–36.0)
MCV: 86.5 fL (ref 78.0–100.0)
Platelets: 254 10*3/uL (ref 150–400)
RBC: 3.48 MIL/uL — AB (ref 3.87–5.11)
RDW: 13 % (ref 11.5–15.5)
WBC: 10.2 10*3/uL (ref 4.0–10.5)

## 2016-09-09 LAB — DIC (DISSEMINATED INTRAVASCULAR COAGULATION) PANEL
D DIMER QUANT: 1.04 ug{FEU}/mL — AB (ref 0.00–0.50)
FIBRINOGEN: 407 mg/dL (ref 210–475)
PLATELETS: 254 10*3/uL (ref 150–400)
PROTHROMBIN TIME: 13.3 s (ref 11.4–15.2)

## 2016-09-09 SURGERY — Surgical Case
Anesthesia: Spinal

## 2016-09-09 MED ORDER — PRENATAL MULTIVITAMIN CH
1.0000 | ORAL_TABLET | Freq: Every day | ORAL | Status: DC
  Administered 2016-09-09: 1 via ORAL
  Filled 2016-09-09 (×2): qty 1

## 2016-09-09 MED ORDER — SCOPOLAMINE 1 MG/3DAYS TD PT72
MEDICATED_PATCH | TRANSDERMAL | Status: DC | PRN
  Administered 2016-09-09: 1 via TRANSDERMAL

## 2016-09-09 MED ORDER — COCONUT OIL OIL: 1.0000 "application " | TOPICAL_OIL | Status: DC | PRN

## 2016-09-09 MED ORDER — KETOROLAC TROMETHAMINE 30 MG/ML IJ SOLN
30.0000 mg | Freq: Four times a day (QID) | INTRAMUSCULAR | Status: AC | PRN
  Administered 2016-09-09: 30 mg via INTRAMUSCULAR

## 2016-09-09 MED ORDER — SIMETHICONE 80 MG PO CHEW
80.0000 mg | CHEWABLE_TABLET | ORAL | Status: DC
  Administered 2016-09-09 – 2016-09-10 (×2): 80 mg via ORAL
  Filled 2016-09-09 (×2): qty 1

## 2016-09-09 MED ORDER — SOD CITRATE-CITRIC ACID 500-334 MG/5ML PO SOLN
ORAL | Status: AC
  Administered 2016-09-09: 04:00:00
  Filled 2016-09-09: qty 15

## 2016-09-09 MED ORDER — DIBUCAINE 1 % RE OINT: 1.0000 "application " | TOPICAL_OINTMENT | RECTAL | Status: DC | PRN

## 2016-09-09 MED ORDER — CEFAZOLIN SODIUM-DEXTROSE 2-3 GM-% IV SOLR
INTRAVENOUS | Status: DC | PRN
  Administered 2016-09-09: 2 g via INTRAVENOUS

## 2016-09-09 MED ORDER — NALBUPHINE HCL 10 MG/ML IJ SOLN: 5.0000 mg | INTRAMUSCULAR | Status: DC | PRN

## 2016-09-09 MED ORDER — FENTANYL CITRATE (PF) 100 MCG/2ML IJ SOLN
INTRAMUSCULAR | Status: AC
  Filled 2016-09-09: qty 2

## 2016-09-09 MED ORDER — SIMETHICONE 80 MG PO CHEW: 80.0000 mg | CHEWABLE_TABLET | ORAL | Status: DC | PRN

## 2016-09-09 MED ORDER — MEPERIDINE HCL 25 MG/ML IJ SOLN: 6.2500 mg | INTRAMUSCULAR | Status: DC | PRN

## 2016-09-09 MED ORDER — ZOLPIDEM TARTRATE 5 MG PO TABS
5.0000 mg | ORAL_TABLET | Freq: Every evening | ORAL | Status: DC | PRN
  Administered 2016-09-11: 5 mg via ORAL
  Filled 2016-09-09: qty 1

## 2016-09-09 MED ORDER — SCOPOLAMINE 1 MG/3DAYS TD PT72: 1.0000 | MEDICATED_PATCH | Freq: Once | TRANSDERMAL | Status: DC

## 2016-09-09 MED ORDER — MORPHINE SULFATE (PF) 0.5 MG/ML IJ SOLN
INTRAMUSCULAR | Status: AC
  Filled 2016-09-09: qty 10

## 2016-09-09 MED ORDER — OXYTOCIN 10 UNIT/ML IJ SOLN
INTRAVENOUS | Status: DC | PRN
  Administered 2016-09-09: 40 [IU] via INTRAVENOUS

## 2016-09-09 MED ORDER — PHENYLEPHRINE 8 MG IN D5W 100 ML (0.08MG/ML) PREMIX OPTIME
INJECTION | INTRAVENOUS | Status: DC | PRN
  Administered 2016-09-09: 60 ug/min via INTRAVENOUS

## 2016-09-09 MED ORDER — ONDANSETRON HCL 4 MG/2ML IJ SOLN: 4.0000 mg | Freq: Three times a day (TID) | INTRAMUSCULAR | Status: DC | PRN

## 2016-09-09 MED ORDER — FERROUS SULFATE 325 (65 FE) MG PO TABS
325.0000 mg | ORAL_TABLET | Freq: Two times a day (BID) | ORAL | Status: DC
  Administered 2016-09-09 – 2016-09-10 (×3): 325 mg via ORAL
  Filled 2016-09-09 (×3): qty 1

## 2016-09-09 MED ORDER — WITCH HAZEL-GLYCERIN EX PADS: 1.0000 "application " | MEDICATED_PAD | CUTANEOUS | Status: DC | PRN

## 2016-09-09 MED ORDER — NALBUPHINE HCL 10 MG/ML IJ SOLN: 5.0000 mg | Freq: Once | INTRAMUSCULAR | Status: DC | PRN

## 2016-09-09 MED ORDER — ONDANSETRON HCL 4 MG/2ML IJ SOLN
INTRAMUSCULAR | Status: AC
  Filled 2016-09-09: qty 2

## 2016-09-09 MED ORDER — MEASLES, MUMPS & RUBELLA VAC ~~LOC~~ INJ
0.5000 mL | INJECTION | Freq: Once | SUBCUTANEOUS | Status: DC
  Filled 2016-09-09: qty 0.5

## 2016-09-09 MED ORDER — MORPHINE SULFATE (PF) 0.5 MG/ML IJ SOLN
INTRAMUSCULAR | Status: DC | PRN
  Administered 2016-09-09: .1 mg via INTRATHECAL

## 2016-09-09 MED ORDER — FENTANYL CITRATE (PF) 100 MCG/2ML IJ SOLN
INTRAMUSCULAR | Status: DC | PRN
  Administered 2016-09-09: 12.5 ug via INTRATHECAL

## 2016-09-09 MED ORDER — OXYCODONE-ACETAMINOPHEN 5-325 MG PO TABS
1.0000 | ORAL_TABLET | ORAL | Status: DC | PRN
  Administered 2016-09-10 (×2): 1 via ORAL
  Filled 2016-09-09: qty 1

## 2016-09-09 MED ORDER — MAGNESIUM HYDROXIDE 400 MG/5ML PO SUSP: 30.0000 mL | ORAL | Status: DC | PRN

## 2016-09-09 MED ORDER — ACETAMINOPHEN 325 MG PO TABS
650.0000 mg | ORAL_TABLET | ORAL | Status: DC | PRN
  Administered 2016-09-10: 650 mg via ORAL
  Filled 2016-09-09: qty 2

## 2016-09-09 MED ORDER — BUPIVACAINE HCL (PF) 0.5 % IJ SOLN
INTRAMUSCULAR | Status: AC
  Filled 2016-09-09: qty 30

## 2016-09-09 MED ORDER — KETOROLAC TROMETHAMINE 30 MG/ML IJ SOLN: 30.0000 mg | Freq: Four times a day (QID) | INTRAMUSCULAR | Status: AC | PRN

## 2016-09-09 MED ORDER — IBUPROFEN 600 MG PO TABS
600.0000 mg | ORAL_TABLET | Freq: Four times a day (QID) | ORAL | Status: DC
  Administered 2016-09-09 – 2016-09-11 (×8): 600 mg via ORAL
  Filled 2016-09-09 (×8): qty 1

## 2016-09-09 MED ORDER — DIPHENHYDRAMINE HCL 25 MG PO CAPS: 25.0000 mg | ORAL_CAPSULE | Freq: Four times a day (QID) | ORAL | Status: DC | PRN

## 2016-09-09 MED ORDER — TETANUS-DIPHTH-ACELL PERTUSSIS 5-2.5-18.5 LF-MCG/0.5 IM SUSP: 0.5000 mL | Freq: Once | INTRAMUSCULAR | Status: DC

## 2016-09-09 MED ORDER — OXYTOCIN 10 UNIT/ML IJ SOLN
INTRAMUSCULAR | Status: AC
  Filled 2016-09-09: qty 4

## 2016-09-09 MED ORDER — BUPIVACAINE HCL (PF) 0.5 % IJ SOLN
INTRAMUSCULAR | Status: DC | PRN
  Administered 2016-09-09: 30 mL

## 2016-09-09 MED ORDER — NALOXONE HCL 2 MG/2ML IJ SOSY
1.0000 ug/kg/h | PREFILLED_SYRINGE | INTRAVENOUS | Status: DC | PRN
  Filled 2016-09-09: qty 2

## 2016-09-09 MED ORDER — OXYCODONE-ACETAMINOPHEN 5-325 MG PO TABS
2.0000 | ORAL_TABLET | ORAL | Status: DC | PRN
  Administered 2016-09-10 – 2016-09-11 (×3): 2 via ORAL
  Filled 2016-09-09 (×4): qty 2

## 2016-09-09 MED ORDER — SODIUM CHLORIDE 0.9% FLUSH: 3.0000 mL | INTRAVENOUS | Status: DC | PRN

## 2016-09-09 MED ORDER — BUPIVACAINE IN DEXTROSE 0.75-8.25 % IT SOLN
INTRATHECAL | Status: DC | PRN
  Administered 2016-09-09: 1.4 mL via INTRATHECAL

## 2016-09-09 MED ORDER — SCOPOLAMINE 1 MG/3DAYS TD PT72
MEDICATED_PATCH | TRANSDERMAL | Status: AC
  Filled 2016-09-09: qty 1

## 2016-09-09 MED ORDER — OXYTOCIN 40 UNITS IN LACTATED RINGERS INFUSION - SIMPLE MED: 2.5000 [IU]/h | INTRAVENOUS | Status: AC

## 2016-09-09 MED ORDER — MENTHOL 3 MG MT LOZG
1.0000 | LOZENGE | OROMUCOSAL | Status: DC | PRN
Start: 2016-09-09 — End: 2016-09-11

## 2016-09-09 MED ORDER — NALOXONE HCL 0.4 MG/ML IJ SOLN: 0.4000 mg | INTRAMUSCULAR | Status: DC | PRN

## 2016-09-09 MED ORDER — SODIUM CHLORIDE 0.9 % IR SOLN
Status: DC | PRN
  Administered 2016-09-09: 1

## 2016-09-09 MED ORDER — KETOROLAC TROMETHAMINE 30 MG/ML IJ SOLN
INTRAMUSCULAR | Status: AC
  Administered 2016-09-09: 30 mg via INTRAMUSCULAR
  Filled 2016-09-09: qty 1

## 2016-09-09 MED ORDER — ONDANSETRON HCL 4 MG/2ML IJ SOLN
INTRAMUSCULAR | Status: DC | PRN
  Administered 2016-09-09: 4 mg via INTRAVENOUS

## 2016-09-09 MED ORDER — DIPHENHYDRAMINE HCL 25 MG PO CAPS
25.0000 mg | ORAL_CAPSULE | ORAL | Status: DC | PRN
  Filled 2016-09-09: qty 1

## 2016-09-09 MED ORDER — DEXAMETHASONE SODIUM PHOSPHATE 10 MG/ML IJ SOLN
INTRAMUSCULAR | Status: AC
  Filled 2016-09-09: qty 1

## 2016-09-09 MED ORDER — LACTATED RINGERS IV SOLN: INTRAVENOUS | Status: DC

## 2016-09-09 MED ORDER — SENNOSIDES-DOCUSATE SODIUM 8.6-50 MG PO TABS
2.0000 | ORAL_TABLET | ORAL | Status: DC
  Administered 2016-09-09 – 2016-09-10 (×2): 2 via ORAL
  Filled 2016-09-09 (×2): qty 2

## 2016-09-09 MED ORDER — FENTANYL CITRATE (PF) 100 MCG/2ML IJ SOLN: 25.0000 ug | INTRAMUSCULAR | Status: DC | PRN

## 2016-09-09 MED ORDER — LACTATED RINGERS IV SOLN
INTRAVENOUS | Status: DC | PRN
  Administered 2016-09-09: 05:00:00 via INTRAVENOUS

## 2016-09-09 MED ORDER — METOCLOPRAMIDE HCL 5 MG/ML IJ SOLN: 10.0000 mg | Freq: Once | INTRAMUSCULAR | Status: DC | PRN

## 2016-09-09 MED ORDER — DIPHENHYDRAMINE HCL 50 MG/ML IJ SOLN: 12.5000 mg | INTRAMUSCULAR | Status: DC | PRN

## 2016-09-09 MED ORDER — LACTATED RINGERS IV SOLN
INTRAVENOUS | Status: DC | PRN
  Administered 2016-09-09 (×2): via INTRAVENOUS

## 2016-09-09 SURGICAL SUPPLY — 30 items
CHLORAPREP W/TINT 26ML (MISCELLANEOUS) ×2 IMPLANT
CLAMP CORD UMBIL (MISCELLANEOUS) IMPLANT
CLOTH BEACON ORANGE TIMEOUT ST (SAFETY) ×2 IMPLANT
DRSG OPSITE POSTOP 4X10 (GAUZE/BANDAGES/DRESSINGS) ×2 IMPLANT
ELECT REM PT RETURN 9FT ADLT (ELECTROSURGICAL) ×2
ELECTRODE REM PT RTRN 9FT ADLT (ELECTROSURGICAL) ×1 IMPLANT
EXTRACTOR VACUUM M CUP 4 TUBE (SUCTIONS) IMPLANT
GLOVE BIOGEL PI IND STRL 7.0 (GLOVE) ×3 IMPLANT
GLOVE BIOGEL PI INDICATOR 7.0 (GLOVE) ×3
GLOVE ECLIPSE 7.0 STRL STRAW (GLOVE) ×2 IMPLANT
GOWN STRL REUS W/TWL LRG LVL3 (GOWN DISPOSABLE) ×4 IMPLANT
KIT ABG SYR 3ML LUER SLIP (SYRINGE) IMPLANT
NDL HYPO 25X5/8 SAFETYGLIDE (NEEDLE) ×1 IMPLANT
NEEDLE HYPO 22GX1.5 SAFETY (NEEDLE) ×2 IMPLANT
NEEDLE HYPO 25X5/8 SAFETYGLIDE (NEEDLE) ×2 IMPLANT
NS IRRIG 1000ML POUR BTL (IV SOLUTION) ×2 IMPLANT
PACK C SECTION WH (CUSTOM PROCEDURE TRAY) ×2 IMPLANT
PAD ABD 7.5X8 STRL (GAUZE/BANDAGES/DRESSINGS) ×2 IMPLANT
PAD OB MATERNITY 4.3X12.25 (PERSONAL CARE ITEMS) ×2 IMPLANT
PENCIL SMOKE EVAC W/HOLSTER (ELECTROSURGICAL) ×2 IMPLANT
RTRCTR C-SECT PINK 25CM LRG (MISCELLANEOUS) IMPLANT
SUT PDS AB 0 CT1 27 (SUTURE) ×1 IMPLANT
SUT PDS AB 0 CTX 36 PDP370T (SUTURE) IMPLANT
SUT PLAIN 2 0 XLH (SUTURE) IMPLANT
SUT VIC AB 0 CTX 36 (SUTURE) ×8
SUT VIC AB 0 CTX36XBRD ANBCTRL (SUTURE) ×2 IMPLANT
SUT VIC AB 4-0 KS 27 (SUTURE) ×2 IMPLANT
SYR CONTROL 10ML LL (SYRINGE) ×2 IMPLANT
TOWEL OR 17X24 6PK STRL BLUE (TOWEL DISPOSABLE) ×2 IMPLANT
TRAY FOLEY CATH SILVER 14FR (SET/KITS/TRAYS/PACK) ×2 IMPLANT

## 2016-09-09 NOTE — Anesthesia Preprocedure Evaluation (Signed)
Anesthesia Evaluation  Patient identified by MRN, date of birth, ID band Patient awake    Reviewed: Allergy & Precautions, NPO status , Patient's Chart, lab work & pertinent test results  Airway Mallampati: II  TM Distance: >3 FB Neck ROM: Full    Dental no notable dental hx.    Pulmonary neg pulmonary ROS, former smoker,    Pulmonary exam normal breath sounds clear to auscultation       Cardiovascular negative cardio ROS Normal cardiovascular exam Rhythm:Regular Rate:Normal     Neuro/Psych negative neurological ROS  negative psych ROS   GI/Hepatic negative GI ROS, Neg liver ROS,   Endo/Other  negative endocrine ROS  Renal/GU negative Renal ROS  negative genitourinary   Musculoskeletal negative musculoskeletal ROS (+)   Abdominal   Peds negative pediatric ROS (+)  Hematology negative hematology ROS (+)   Anesthesia Other Findings   Reproductive/Obstetrics (+) Pregnancy 28 weeks. Abruption                             Anesthesia Physical Anesthesia Plan  ASA: II and emergent  Anesthesia Plan: Spinal   Post-op Pain Management:    Induction:   Airway Management Planned: Natural Airway  Additional Equipment:   Intra-op Plan:   Post-operative Plan:   Informed Consent: I have reviewed the patients History and Physical, chart, labs and discussed the procedure including the risks, benefits and alternatives for the proposed anesthesia with the patient or authorized representative who has indicated his/her understanding and acceptance.   Dental advisory given  Plan Discussed with: CRNA  Anesthesia Plan Comments:         Anesthesia Quick Evaluation

## 2016-09-09 NOTE — Lactation Note (Signed)
This note was copied from a baby's chart. Lactation Consultation Note  Patient Name: Boy Stark JockLindsay Rostad ZOXWR'UToday's Date: 09/09/2016 Reason for consult: Initial assessment;NICU baby Breastfeeding consultation services and support information given and reviewed.  Providing Breastmilk For Your Baby in NICU booklet also given.  Mom delivered at 28.1 weeks and newborn is 5 hours old.  Mom has a history of nursing one baby for 3 years and last baby (139 months old) had a lip tie, poor tone and aspiration.  Mom struggled with milk supply and pumped for 6 months.  Nursing staff assisting mom with initiating pumping.  Instructed to pump 8-12 times/24 hours.  Mom has a Spectra 2 pump for home use.  Encouraged to call with concerns/assist. Maternal Data Has patient been taught Hand Expression?: Yes Does the patient have breastfeeding experience prior to this delivery?: Yes  Feeding    LATCH Score/Interventions                      Lactation Tools Discussed/Used Pump Review: Setup, frequency, and cleaning;Milk Storage Initiated by:: RN Date initiated:: 09/09/16   Consult Status Consult Status: Follow-up Date: 09/10/16 Follow-up type: In-patient    Huston FoleyMOULDEN, Mela Perham S 09/09/2016, 10:08 AM

## 2016-09-09 NOTE — Op Note (Signed)
Robin [name] PROCEDURE DATE: 09/09/2016  PREOPERATIVE DIAGNOSES: Intrauterine pregnancy at [redacted]w[redacted]d weeks gestation; abruptio placenta and PPROM since [redacted] weeks gestation  POSTOPERATIVE DIAGNOSES: The same  PROCEDURE: Primary Low Transverse Cesarean Section  SURGEON:  Dr. Jaynie [name]  ANESTHESIOLOGIST: Dr. Saddie Benders  INDICATIONS: Robin [name] is a 31 y.o. [id number] at [redacted]w[redacted]d here for cesarean section secondary to the indications listed under preoperative diagnoses; please see preoperative note for further details.  The risks of cesarean section were discussed with the patient including but were not limited to: bleeding which may require transfusion or reoperation; infection which may require antibiotics; injury to bowel, bladder, ureters or other surrounding organs; injury to the fetus; need for additional procedures including hysterectomy in the event of a life-threatening hemorrhage; placental abnormalities wth subsequent pregnancies, incisional problems, thromboembolic phenomenon and other postoperative/anesthesia complications.   The patient concurred with the proposed plan, giving informed written consent for the procedure.    FINDINGS:  Viable female infant in cephalic presentation.  Apgars and weight pending at time of note.  Large clots and blood noted in uterus and in retroplacental area; EBL due to abruption 500 ml.  Bloody amniotic fluid.  Intact placenta, three vessel cord.  Normal uterus, fallopian tubes and ovaries bilaterally.  ANESTHESIA: Spinal INTRAVENOUS FLUIDS: 2500 ml ESTIMATED BLOOD LOSS: 800 ml URINE OUTPUT:  500 ml SPECIMENS: Placenta sent home with patient as per her request; declined pathology testing. COMPLICATIONS: None immediate  PROCEDURE IN DETAIL:  The patient preoperatively received intravenous antibiotics and had sequential compression devices applied to her lower extremities.  She was then taken to the operating room where spinal anesthesia was  administered and was found to be adequate. She was then placed in a dorsal supine position with a leftward tilt, and prepped and draped in a sterile manner.  A foley catheter was placed into her bladder and attached to constant gravity.  After an adequate timeout was performed, a Pfannenstiel skin incision was made with scalpel and carried through to the underlying layer of fascia. The fascia was incised in the midline, and this incision was extended bilaterally using the Mayo scissors.  Kocher clamps were applied to the superior aspect of the fascial incision and the underlying rectus muscles were dissected off bluntly.  A similar process was carried out on the inferior aspect of the fascial incision. The rectus muscles were separated in the midline bluntly and the peritoneum was entered bluntly. Attention was turned to the lower uterine segment where a low transverse hysterotomy was made with a scalpel and extended bilaterally bluntly.  The infant was successfully delivered, the cord was clamped and cut after one minute, and the infant was handed over to the awaiting neonatology team. Uterine massage was then administered, and the placenta delivered intact with a three-vessel cord. The uterus was then cleared of clots and debris.  The hysterotomy was closed with 0 Vicryl in a running locked fashion, and an imbricating layer was also placed with 0 Vicryl.  Figure-of-eight 0 Vicryl serosal stitches were placed to help with hemostasis.  The pelvis was cleared of all clot and debris. Hemostasis was confirmed on all surfaces.  The peritoneum and the rectus muscles were reapproximated using 0 Vicryl interrupted stitches. The fascia was then closed using 0 Vicryl in a running fashion.  The subcutaneous layer was irrigated, and 30 ml of 0.5% Marcaine was injected subcutaneously around the incision.  The skin was closed with a 4-0 Vicryl subcuticular stitch. The patient tolerated  the procedure well. Sponge, lap, instrument  and needle counts were correct x 3.  She was taken to the recovery room in stable condition.     Robin CollinsUGONNA  Robin Parekh, MD, FACOG Attending Obstetrician & Gynecologist Faculty Practice, [facility] [place]

## 2016-09-09 NOTE — Transfer of Care (Signed)
Immediate Anesthesia Transfer of Care Note  Patient: Robin [name]  Procedure(s) Performed: Procedure(s): CESAREAN SECTION (N/A)  Patient Location: PACU  Anesthesia Type:Spinal  Level of Consciousness: awake, alert  and oriented  Airway & Oxygen Therapy: Patient Spontanous Breathing  Post-op Assessment: Report given to RN and Post -op Vital signs reviewed and stable  Post vital signs: Reviewed and stable  Last Vitals:  09/09/2016 0555 Bp 101/62 HR 78 Resp 14 O2 sat 100%  Last Pain:  Vitals:   09/08/16 2311  TempSrc:   PainSc: 0-No pain      Patients Stated Pain Goal: 0 (09/05/16 1957)  Complications: No apparent anesthesia complications

## 2016-09-09 NOTE — Progress Notes (Signed)
Pt off unit to NICU.

## 2016-09-09 NOTE — Progress Notes (Signed)
Faculty Practice OB/GYN Attending Note   Subjective:  Called to evaluate patient with moderate vaginal bleeding. Had gush of blood then continued bleeding. EBL ~300 ml.  FHR reassuring.  Regular q1-2 minute contractions.    Admitted on 08/03/2016 for Preterm premature rupture of membranes (PPROM) with unknown onset of labor.    Objective:  Blood pressure (!) 101/50, pulse 83, temperature 98.3 F (36.8 C), temperature source Axillary, resp. rate 16, height 5\' 6"  (1.676 m), weight 118 lb (53.5 kg), last menstrual period 02/23/2015, SpO2 99 %, currently breastfeeding. FHT  Baseline 135 bpm, moderate variability, +accelerations, no decelerations Toco: q 1-2 min Gen: NAD HENT: Normocephalic, atraumatic Heart: Regular rate noted Abdomen: NT, gravid fundus, soft Cervix: +Active bleeding noted. Visually closed Ext: 2+ DTRs, no edema, no cyanosis, negative Homan's sign  Assessment & Plan:  31 y.o. [id number] at 2110w1d admitted for PPROM now with ongoing placental abruption. Counseled about need for delivery via cesarean section.  The risks of cesarean section discussed with the patient included but were not limited to: bleeding which may require transfusion or reoperation; infection which may require antibiotics; injury to bowel, bladder, ureters or other surrounding organs; injury to the fetus; need for additional procedures including hysterectomy in the event of a life-threatening hemorrhage; placental abnormalities wth subsequent pregnancies, incisional problems, thromboembolic phenomenon and other postoperative/anesthesia complications. The patient concurred with the proposed plan, giving informed written consent for the procedure.   Patient has been NPO since 2000 yesterday and she will remain NPO for procedure. Anesthesia and OR aware. Preoperative prophylactic antibiotics and SCDs ordered on call to the OR.  To OR when ready.    Robin CollinsUGONNA  Robin Alwine, MD, FACOG Attending Obstetrician &  Gynecologist Faculty Practice, [facility] [place]

## 2016-09-09 NOTE — Anesthesia Procedure Notes (Signed)
Spinal  Patient location during procedure: OR Staffing Anesthesiologist: Unique Sillas Performed: anesthesiologist  Preanesthetic Checklist Completed: patient identified, site marked, surgical consent, pre-op evaluation, timeout performed, IV checked, risks and benefits discussed and monitors and equipment checked Spinal Block Patient position: sitting Prep: ChloraPrep Patient monitoring: heart rate, continuous pulse ox and blood pressure Approach: right paramedian Location: L4-5 Injection technique: single-shot Needle Needle type: Sprotte  Needle gauge: 24 G Needle length: 9 cm Additional Notes Expiration date of kit checked and confirmed. Patient tolerated procedure well, without complications.       

## 2016-09-10 LAB — CBC
HCT: 24.6 % — ABNORMAL LOW (ref 36.0–46.0)
HEMOGLOBIN: 8.6 g/dL — AB (ref 12.0–15.0)
MCH: 30.3 pg (ref 26.0–34.0)
MCHC: 35 g/dL (ref 30.0–36.0)
MCV: 86.6 fL (ref 78.0–100.0)
PLATELETS: 223 10*3/uL (ref 150–400)
RBC: 2.84 MIL/uL — ABNORMAL LOW (ref 3.87–5.11)
RDW: 13.2 % (ref 11.5–15.5)
WBC: 9.2 10*3/uL (ref 4.0–10.5)

## 2016-09-10 NOTE — Progress Notes (Signed)
Patient in NICU. Infant has passed. Infant and husband in a room off of the NICU with infant. Comfort and Emotional support offered. Pt medicated for incisional pain. Carmelina DaneERRI L Maysen Sudol, RN

## 2016-09-10 NOTE — Progress Notes (Signed)
Subjective: Postpartum Day 1: Cesarean Delivery Patient reports incisional pain, tolerating PO and no problems voiding.    Objective: Vital signs in last 24 hours: Temp:  [98.1 F (36.7 C)-98.5 F (36.9 C)] 98.3 F (36.8 C) (02/17 0412) Pulse Rate:  [60-85] 80 (02/17 0412) Resp:  [16-17] 16 (02/16 2330) BP: (79-106)/(36-67) 79/36 (02/17 0412) SpO2:  [99 %-100 %] 100 % (02/17 0412)  Physical Exam:  General: alert, cooperative and appears stated age Lochia: appropriate Uterine Fundus: firm Incision: dressing is clean and dry DVT Evaluation: No evidence of DVT seen on physical exam.   Recent Labs  09/09/16 0249 09/10/16 0524  HGB 10.3* 8.6*  HCT 30.1* 24.6*    Assessment/Plan: Status post Cesarean section. Doing well postoperatively.  Continue current care. Pumping q 2 hour Husband for vasectomy  Robin [name] 09/10/2016, 7:38 AM

## 2016-09-11 MED ORDER — OXYCODONE-ACETAMINOPHEN 5-325 MG PO TABS: 1.0000 | ORAL_TABLET | ORAL | 0 refills | Status: DC | PRN

## 2016-09-11 MED ORDER — FERROUS SULFATE 325 (65 FE) MG PO TABS: 325.0000 mg | ORAL_TABLET | Freq: Two times a day (BID) | ORAL | 3 refills | Status: DC

## 2016-09-11 MED ORDER — IBUPROFEN 600 MG PO TABS: 600.0000 mg | ORAL_TABLET | Freq: Four times a day (QID) | ORAL | 6 refills | Status: DC

## 2016-09-11 MED ORDER — DOCUSATE SODIUM 100 MG PO CAPS: 100.0000 mg | ORAL_CAPSULE | Freq: Two times a day (BID) | ORAL | 2 refills | Status: DC | PRN

## 2016-09-11 NOTE — Discharge Instructions (Signed)

## 2016-09-11 NOTE — Discharge Summary (Signed)
OB Discharge Summary     Patient Name: Robin [name] DOB: 1985/08/03 MRN: [id number]  Date of admission: 08/03/2016 Delivering MD: Jaynie Collins A   Date of discharge: 09/11/2016  Admitting diagnosis: 13WKS,BLEEDING, oligohydramnios Intrauterine pregnancy: [redacted]w[redacted]d     Secondary diagnosis:  Principal Problem:   Preterm premature rupture of membranes (PPROM) with unknown onset of labor Active Problems:   Placental abruption, third trimester   Dysuria   Premature rupture of membranes in second trimester      Discharge diagnosis: Preterm Pregnancy Delivered                                                                                                Complications: Placental Abruption  Hospital course:  Patient with PPROM since 18 weeks admitted on 08/03/16 for BMZ, latency antibiotics and inpatient observations. She was stable until the day of delivery when she experienced a copious amount of vaginal bleeding.  Details of operation can be found in separate operative note.  Pateint had an uncomplicated postpartum course.  She is ambulating, tolerating a regular diet, passing flatus, and urinating well. Patient is discharged home in stable condition on  09/11/16. Discharge instructions and precautions discussed with the patient         Physical exam  Vitals:   09/09/16 2330 09/10/16 0412 09/10/16 0800 09/10/16 2017  BP: (!) 94/45 (!) 79/36 (!) 98/47 (!) 98/51  Pulse: 76 80 74 88  Resp: 16  18 18   Temp: 98.2 F (36.8 C) 98.3 F (36.8 C) 97.9 F (36.6 C) 98.3 F (36.8 C)  TempSrc: Oral Tympanic Oral Oral  SpO2: 100% 100% 100% 99%  Weight:      Height:       General: alert, cooperative and no distress Lochia: appropriate Uterine Fundus: firm Incision: Dressing is clean, dry, and intact DVT Evaluation: No evidence of DVT seen on physical exam. Labs: Lab Results  Component Value Date   WBC 9.2 09/10/2016   HGB 8.6 (L) 09/10/2016   HCT 24.6 (L) 09/10/2016   MCV 86.6  09/10/2016   PLT 223 09/10/2016   CMP Latest Ref Rng & Units 05/04/2016  Glucose 65 - 99 mg/dL 69    Discharge instruction: per After Visit Summary and "Baby and Me Booklet".  After visit meds:  Allergies as of 09/11/2016   No Known Allergies     Medication List    STOP taking these medications   Doxylamine-Pyridoxine 10-10 MG Tbec Commonly known as:  DICLEGIS     TAKE these medications   docusate sodium 100 MG capsule Commonly known as:  COLACE Take 1 capsule (100 mg total) by mouth 2 (two) times daily as needed.   ferrous sulfate 325 (65 FE) MG tablet Take 1 tablet (325 mg total) by mouth 2 (two) times daily with a meal.   ibuprofen 600 MG tablet Commonly known as:  ADVIL,MOTRIN Take 1 tablet (600 mg total) by mouth every 6 (six) hours.   oxyCODONE-acetaminophen 5-325 MG tablet Commonly known as:  PERCOCET/ROXICET Take 1-2 tablets by mouth every 4 (four) hours as needed (pain  scale 4-7).   Prenatal Vitamins 28-0.8 MG Tabs Take 1 tablet by mouth daily.       Diet: routine diet  Activity: Advance as tolerated. Pelvic rest for 6 weeks.   Outpatient follow up:6 weeks Follow up Appt:No future appointments. Follow up Visit:No Follow-up on file.  Postpartum contraception: None  Newborn Data: Live born female  Birth Weight: 2 lb 9.6 oz (1180 g) APGAR: 4, 5 Infant passed away on 09/10/2016  Disposition:morgue   09/11/2016 Catalina AntiguaPeggy Teagen Mcleary, MD

## 2016-09-12 NOTE — Progress Notes (Signed)
CH paged back to nicu as baby Saddler passed. Parents were appropriately grieving their loss. They have a strong faith community and family support system. CH provided brochures in pt's bag regarding grieving and suggestions to talk to their older children. Pt's in-laws and pastor were present to offer support. CH provided presence, emotional support and offer nutrition.  Chaplain Marjory LiesPamela Carrington Holder, M.Div.   09/10/16 1415  Clinical Encounter Type  Visited With Patient;Family;Patient and family together

## 2016-09-12 NOTE — Progress Notes (Signed)
I arrived to NICU and met baby Saddler's parents nearby, in the room, as staff was working w/infant. Parents were very tearful during my visit and wanted prayer. They said they had many people praying for them. Following prayer they were allowed to move cradle-side and watch over little Saddler. Parents were grateful for visit and prayer. Staff was asked to paged if there were any changes. Chaplain Ernest Haber, M.Div.   09/10/16 1030  Clinical Encounter Type  Visited With Family;Patient and family together

## 2016-09-15 NOTE — Anesthesia Postprocedure Evaluation (Signed)
Anesthesia Post Note  Patient: Robin [name]  Procedure(s) Performed: Procedure(s) (LRB): CESAREAN SECTION (N/A)  Patient location during evaluation: PACU Anesthesia Type: Spinal Level of consciousness: awake and alert Pain management: pain level controlled Vital Signs Assessment: post-procedure vital signs reviewed and stable Respiratory status: spontaneous breathing and respiratory function stable Cardiovascular status: blood pressure returned to baseline and stable Postop Assessment: no headache, no backache and spinal receding Anesthetic complications: no        Last Vitals:  Vitals:   09/10/16 2017 09/11/16 0800  BP: (!) 98/51 106/61  Pulse: 88 76  Resp: 18 18  Temp: 36.8 C 36.7 C    Last Pain:  Vitals:   09/11/16 1030  TempSrc:   PainSc: 7    Pain Goal: Patients Stated Pain Goal: 2 (09/11/16 1030)               Phillips Groutarignan, Damisha Wolff

## 2016-09-22 ENCOUNTER — Encounter: Payer: Self-pay | Admitting: *Deleted

## 2016-10-12 ENCOUNTER — Ambulatory Visit (INDEPENDENT_AMBULATORY_CARE_PROVIDER_SITE_OTHER): Payer: 59 | Admitting: Obstetrics & Gynecology

## 2016-10-12 ENCOUNTER — Encounter: Payer: Self-pay | Admitting: Obstetrics & Gynecology

## 2016-10-12 VITALS — BP 98/66 | HR 67 | Ht 67.0 in | Wt 121.0 lb

## 2016-10-12 DIAGNOSIS — O99345 Other mental disorders complicating the puerperium: Secondary | ICD-10-CM

## 2016-10-12 DIAGNOSIS — F53 Postpartum depression: Secondary | ICD-10-CM

## 2016-10-12 DIAGNOSIS — F4321 Adjustment disorder with depressed mood: Secondary | ICD-10-CM

## 2016-10-12 DIAGNOSIS — F4320 Adjustment disorder, unspecified: Secondary | ICD-10-CM

## 2016-10-12 DIAGNOSIS — Z1389 Encounter for screening for other disorder: Secondary | ICD-10-CM

## 2016-10-12 DIAGNOSIS — Z8759 Personal history of other complications of pregnancy, childbirth and the puerperium: Secondary | ICD-10-CM

## 2016-10-12 DIAGNOSIS — F419 Anxiety disorder, unspecified: Secondary | ICD-10-CM

## 2016-10-12 MED ORDER — DULOXETINE HCL 20 MG PO CPEP: ORAL_CAPSULE | ORAL | 3 refills | Status: DC

## 2016-10-12 NOTE — Progress Notes (Signed)
   Subjective:    Patient ID: Robin [name], female    DOB: 1985-10-26, 31 y.o.   MRN: [id number]  HPI  Pt is a 31 yo female who presents to discuss worsening depression and anxiety.  She experienced  PPROM at 18 weeks and was admitted to the hospital for observation at treatment at 23 weeks.  At 28 weeks she developed a large placental abruption and needed an emergnecy c section.  The infant female died at 30 hours of life after a difficult course in the NICU.  Pt has been crying, irritable, having problems with insomnia.  Pt denies HI and SI.  She scores 26 on depression scale.  Pt usually avoids medicaitons and likes to use mindfullness, relaxation techniques and other natural remedies.  These have not been adequate for her severity of symptoms.    Review of Systems  Constitutional: Positive for activity change.  HENT: Negative.   Eyes: Negative.   Respiratory: Negative.   Cardiovascular: Negative.   Gastrointestinal: Negative.   Genitourinary: Negative.   Skin:       Numbness above c/s incision site--nml postoperative changes  Psychiatric/Behavioral: Positive for agitation, decreased concentration, dysphoric mood and sleep disturbance. Negative for self-injury and suicidal ideas. The patient is nervous/anxious.        Objective:   Physical Exam  Constitutional: She is oriented to person, place, and time. She appears well-developed and well-nourished. No distress.  HENT:  Head: Normocephalic and atraumatic.  Eyes: Conjunctivae are normal.  Pulmonary/Chest: Effort normal.  Abdominal: Soft. There is no tenderness.  Incision is well healed, scar at normal stage of remodeling.  Musculoskeletal: She exhibits no edema.  Neurological: She is alert and oriented to person, place, and time.  Skin: Skin is warm and dry.  Psychiatric: She has a normal mood and affect.  Vitals reviewed.  Vitals:   10/12/16 0934  BP: 98/66  Pulse: 67  Weight: 121 lb (54.9 kg)  Height: 5\' 7"  (1.702 m)     Assessment & Plan:  31 yo [id number] s/p 28 week delivery and subsequent neonatal death.  Pt experiencing depression and anxiety as well as adjustment disorder.  Pt not having SI/HI.  Pt agreeable to medication and therapy/CBT.  Will start Cymbalta and refer to behavioral health for both medication management and counseling.    Preconceptual counseling regarding timing of future pregnancies (waiting 18 months between pregnancies) and getting depression / anxiety treatment.    45 minutes spent face to face with patient with >50% counseling.

## 2016-10-12 NOTE — Progress Notes (Signed)
Postpartum Depression Scale Score: 26.

## 2016-10-14 ENCOUNTER — Telehealth: Payer: Self-pay | Admitting: *Deleted

## 2016-10-14 DIAGNOSIS — K602 Anal fissure, unspecified: Secondary | ICD-10-CM

## 2016-10-14 MED ORDER — HYDROCORTISONE ACE-PRAMOXINE 1-1 % RE FOAM: 1.0000 | Freq: Two times a day (BID) | RECTAL | 2 refills | Status: DC

## 2016-10-14 NOTE — Telephone Encounter (Signed)
Pt called c/oing rectal fissures due to constipation.  She uses Colace PRN but I suggested that she use it daily.  For her fissures RX for Proctofoam was [facility], per Dr Marice Potterove.

## 2016-10-17 ENCOUNTER — Ambulatory Visit: Payer: 59 | Admitting: Obstetrics & Gynecology

## 2016-11-10 ENCOUNTER — Encounter (HOSPITAL_COMMUNITY): Payer: Self-pay | Admitting: Psychiatry

## 2016-11-10 ENCOUNTER — Ambulatory Visit (INDEPENDENT_AMBULATORY_CARE_PROVIDER_SITE_OTHER): Payer: PRIVATE HEALTH INSURANCE | Admitting: Psychiatry

## 2016-11-10 VITALS — BP 116/74 | HR 98 | Resp 16 | Ht 65.0 in | Wt 115.0 lb

## 2016-11-10 DIAGNOSIS — O99345 Other mental disorders complicating the puerperium: Secondary | ICD-10-CM

## 2016-11-10 DIAGNOSIS — Z79899 Other long term (current) drug therapy: Secondary | ICD-10-CM

## 2016-11-10 DIAGNOSIS — F4311 Post-traumatic stress disorder, acute: Secondary | ICD-10-CM

## 2016-11-10 DIAGNOSIS — Z87891 Personal history of nicotine dependence: Secondary | ICD-10-CM | POA: Diagnosis not present

## 2016-11-10 DIAGNOSIS — Z811 Family history of alcohol abuse and dependence: Secondary | ICD-10-CM | POA: Diagnosis not present

## 2016-11-10 DIAGNOSIS — F53 Postpartum depression: Secondary | ICD-10-CM

## 2016-11-10 DIAGNOSIS — F331 Major depressive disorder, recurrent, moderate: Secondary | ICD-10-CM

## 2016-11-10 DIAGNOSIS — Z79891 Long term (current) use of opiate analgesic: Secondary | ICD-10-CM | POA: Diagnosis not present

## 2016-11-10 MED ORDER — BUPROPION HCL ER (SR) 100 MG PO TB12: 100.0000 mg | ORAL_TABLET | Freq: Every day | ORAL | 0 refills | Status: DC
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
## 2016-11-14 ENCOUNTER — Ambulatory Visit (INDEPENDENT_AMBULATORY_CARE_PROVIDER_SITE_OTHER): Payer: PRIVATE HEALTH INSURANCE | Admitting: Licensed Clinical Social Worker

## 2016-11-14 DIAGNOSIS — F53 Postpartum depression: Secondary | ICD-10-CM

## 2016-11-14 DIAGNOSIS — F439 Reaction to severe stress, unspecified: Secondary | ICD-10-CM | POA: Diagnosis not present

## 2016-11-14 DIAGNOSIS — O99345 Other mental disorders complicating the puerperium: Secondary | ICD-10-CM
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
## 2017-03-28 ENCOUNTER — Encounter: Payer: Self-pay | Admitting: *Deleted

## 2017-05-17 ENCOUNTER — Emergency Department (INDEPENDENT_AMBULATORY_CARE_PROVIDER_SITE_OTHER)
Admission: EM | Admit: 2017-05-17 | Discharge: 2017-05-17 | Disposition: A | Discharge status: Home / Self Care | Payer: PRIVATE HEALTH INSURANCE | Source: Home / Self Care | Attending: Family Medicine | Admitting: Family Medicine

## 2017-05-17 ENCOUNTER — Encounter: Payer: Self-pay | Admitting: Emergency Medicine

## 2017-05-17 ENCOUNTER — Emergency Department (INDEPENDENT_AMBULATORY_CARE_PROVIDER_SITE_OTHER): Payer: PRIVATE HEALTH INSURANCE

## 2017-05-17 DIAGNOSIS — M79642 Pain in left hand: Secondary | ICD-10-CM

## 2017-05-17 NOTE — ED Provider Notes (Signed)
[facility]    CSN: [id number] Arrival date & time: 05/17/17  1029     History   Chief Complaint Chief Complaint  Patient presents with  . Hand Pain    left    HPI JYOTI HARJU is a 31 y.o. female.   HPI KATHRENE SINOPOLI is a 31 y.o. female presenting to UC with c/o intermittent Left hand pain and swelling for 1 year w/o known injury. She is Right hand dominant. She is concerned she may have RA but denies any other joint pain, fever, chills, or rashes.  She has not taken any Tylenol or Motrin as she does not like taking medications but she has tried a topical muscle rub and essential oils with mild relief.      Past Medical History:  Diagnosis Date  . Abnormal Pap smear 2008   HAD HPV;HAD GENITAL WART REMOVED;LAST PAP 07/2010 WAS NORMAL  . Anemia    FeSO4 SUPP TAKEN IN PAST  . Asthma    AS A CHILD;GREW OUT OF @ 8 YOA  . Depression   . History of smoking 12/26/2011   Reports quit '12  . Infection    UTI;CAN GET FREQ    Patient Active Problem List   Diagnosis Date Noted  . History of premature rupture of membranes at 34, with subsequent abruption and 28 week delivery & Neonatal demise 10/12/2016  . Dysuria   . Family history of mitral valve prolapse 07/27/2015  . Family hx of hydrocephalus 04/03/2012  . Family hx of bicuspid aortic valve--FOB 04/03/2012  . History of abnormal Pap smear 01/02/2012  . history of genital warts 12/26/2011  . History of smoking 12/26/2011    Past Surgical History:  Procedure Laterality Date  . CESAREAN SECTION N/A 09/09/2016   Procedure: CESAREAN SECTION;  Surgeon: Tereso Newcomer, MD;  Location: [facility];  Service: Obstetrics;  Laterality: N/A;  . NASAL SINUS SURGERY  2016    OB History    Gravida Para Term Preterm AB Living   5 3 3   1 3    SAB TAB Ectopic Multiple Live Births   1       3       Home Medications    Prior to Admission medications   Medication Sig Start Date End Date Taking? Authorizing  Provider  buPROPion (WELLBUTRIN SR) 100 MG 12 hr tablet Take 1 tablet (100 mg total) by mouth daily. 11/10/16   Thresa Ross, MD  cholecalciferol (VITAMIN D) 1000 units tablet Take 5,000 Units by mouth daily.    [provider]  docusate sodium (COLACE) 100 MG capsule Take 1 capsule (100 mg total) by mouth 2 (two) times daily as needed. Patient not taking: Reported on 10/12/2016 09/11/16   Constant, Peggy, MD  DULoxetine (CYMBALTA) 20 MG capsule Take 20 mg PO daily for 3 days, then 40 mg PO daily for 3 days then 60 mg daily thereafter 10/12/16   Lesly Dukes, MD  ferrous sulfate 325 (65 FE) MG tablet Take 1 tablet (325 mg total) by mouth 2 (two) times daily with a meal. Patient not taking: Reported on 10/12/2016 09/11/16   Constant, Peggy, MD  hydrocortisone-pramoxine (PROCTOFOAM HC) rectal foam Place 1 applicator rectally 2 (two) times daily. Patient not taking: Reported on 11/10/2016 10/14/16   [name], MD  ibuprofen (ADVIL,MOTRIN) 600 MG tablet Take 1 tablet (600 mg total) by mouth every 6 (six) hours. Patient not taking: Reported on 10/12/2016 09/11/16  Constant, Peggy, MD  oxyCODONE-acetaminophen (PERCOCET/ROXICET) 5-325 MG tablet Take 1-2 tablets by mouth every 4 (four) hours as needed (pain scale 4-7). Patient not taking: Reported on 10/12/2016 09/11/16   Constant, Peggy, MD  Prenatal Vit-Fe Fumarate-FA (PRENATAL VITAMINS) 28-0.8 MG TABS Take 1 tablet by mouth daily.  04/23/16   [provider]    Family History Family History  Problem Relation Age of Onset  . Mitral valve prolapse Mother        DIED @ 76 YOA OF CONDITION  . Mitral valve prolapse Paternal Aunt   . Heart attack Paternal Grandfather        DECEASED  . Dementia Maternal Grandmother     Social History Social History  Substance Use Topics  . Smoking status: Former Smoker    Types: Cigarettes    Quit date: 06/27/2011  . Smokeless tobacco: Never Used  . Alcohol use No     Comment: BI-WEEKLY PRIOR  TO PREGNANCY     Allergies   Patient has no known allergies.   Review of Systems Review of Systems  Constitutional: Negative for chills and fever.  Musculoskeletal: Positive for arthralgias, joint swelling and myalgias.  Skin: Negative for color change, rash and wound.  Neurological: Negative for weakness and numbness.     Physical Exam Triage Vital Signs ED Triage Vitals  Enc Vitals Group     BP 05/17/17 1051 98/65     Pulse Rate 05/17/17 1051 97     Resp 05/17/17 1051 16     Temp 05/17/17 1051 98.3 F (36.8 C)     Temp Source 05/17/17 1051 Oral     SpO2 05/17/17 1051 100 %     Weight 05/17/17 1051 108 lb (49 kg)     Height 05/17/17 1051 5\' 6"  (1.676 m)     Head Circumference --      Peak Flow --      Pain Score 05/17/17 1052 3     Pain Loc --      Pain Edu? --      Excl. in GC? --    No data found.   Updated Vital Signs BP 98/65 (BP Location: Right Arm)   Pulse 97   Temp 98.3 F (36.8 C) (Oral)   Resp 16   Ht 5\' 6"  (1.676 m)   Wt 108 lb (49 kg)   SpO2 100%   BMI 17.43 kg/m   Visual Acuity Right Eye Distance:   Left Eye Distance:   Bilateral Distance:    Right Eye Near:   Left Eye Near:    Bilateral Near:     Physical Exam  Constitutional: She is oriented to person, place, and time. She appears well-developed and well-nourished.  HENT:  Head: Normocephalic and atraumatic.  Eyes: EOM are normal.  Neck: Normal range of motion.  Cardiovascular: Normal rate.   Pulses:      Radial pulses are 2+ on the left side.  Pulmonary/Chest: Effort normal.  Musculoskeletal: Normal range of motion. She exhibits edema and tenderness.  Left hand: mild edema over MCP joints compared to Right hand. Tenderness to 3rd and 4th MCP joints, worse on palm aspect. Full ROM with 5/5 strength.   Neurological: She is alert and oriented to person, place, and time.  Skin: Skin is warm and dry. Capillary refill takes less than 2 seconds. No erythema.  Left hand: skin in tact.  No ecchymosis or erythema.   Psychiatric: She has a normal mood and affect. Her  behavior is normal.  Nursing note and vitals reviewed.    UC Treatments / Results  Labs (all labs ordered are listed, but only abnormal results are displayed) Labs Reviewed - No data to display  EKG  EKG Interpretation None       Radiology Dg Hand Complete Left  Result Date: 05/17/2017 CLINICAL DATA:  Left hand pain and swelling over the last year. EXAM: LEFT HAND - COMPLETE 3+ VIEW COMPARISON:  None. FINDINGS: There is no evidence of fracture or dislocation. There is no evidence of arthropathy or other focal bone abnormality. Soft tissues are unremarkable. IMPRESSION: Negative. Electronically Signed   By: Paulina FusiMark  Shogry M.D.   On: 05/17/2017 11:46    Procedures Procedures (including critical care time)  Medications Ordered in UC Medications - No data to display   Initial Impression / Assessment and Plan / UC Course  I have reviewed the triage vital signs and the nursing notes.  Pertinent labs & imaging results that were available during my care of the patient were reviewed by me and considered in my medical decision making (see chart for details).     Pt c/o chronic intermittent Left hand pain and swelling. Normal plain films of Left hand No evidence of cellulitis Encouraged f/u with a PCP and Sports Medicine for recurrent Left hand pain.   Final Clinical Impressions(s) / UC Diagnoses   Final diagnoses:  Left hand pain    New Prescriptions Discharge Medication List as of 05/17/2017 11:52 AM       Controlled Substance Prescriptions [place] Controlled Substance Registry consulted? Not Applicable   Rolla Platehelps, Mirza Fessel O, PA-C 05/17/17 1334

## 2017-05-17 NOTE — ED Triage Notes (Signed)
Patient began noticing intermittent left hand pain and edema for about a year; it has become worse recently; she does not have any repetitive motion activities in her life.

## 2017-05-17 NOTE — Discharge Instructions (Signed)
°  You may take 500mg  acetaminophen every 4-6 hours or in combination with ibuprofen 400-600mg  every 6-8 hours as needed for pain and inflammation.  You may also alternate cool and warm compresses to help with pain and inflammation.

## 2017-05-20 ENCOUNTER — Telehealth: Payer: Self-pay | Admitting: Emergency Medicine

## 2017-07-25 NOTE — L&D Delivery Note (Addendum)
Delivery Note At [phone] a viable female infant was delivered via VBAC, presentation: OA. APGAR: 8, 9; weight pending.   Placenta status: spontaneously delivered, intact via Tomasa BlaseSchultz. Cord: 3 vessel. Cord ph n/a. Complications none.  Anesthesia: none Lacerations: 1st degree perineal-hemostatic Suture used for repair: n/a Est. Blood Loss (mL): 50  Mom to postpartum.  Baby to Couplet care / Skin to Skin.   Robin LarryBhambri, Robin [name], CNM 07/09/2018 9:25 AM

## 2017-07-26 ENCOUNTER — Ambulatory Visit (INDEPENDENT_AMBULATORY_CARE_PROVIDER_SITE_OTHER): Payer: PRIVATE HEALTH INSURANCE | Admitting: Obstetrics & Gynecology

## 2017-07-26 ENCOUNTER — Encounter: Payer: Self-pay | Admitting: Obstetrics & Gynecology

## 2017-07-26 VITALS — BP 104/66 | HR 76 | Wt 103.0 lb

## 2017-07-26 DIAGNOSIS — Z8759 Personal history of other complications of pregnancy, childbirth and the puerperium: Secondary | ICD-10-CM | POA: Diagnosis not present

## 2017-07-26 NOTE — Progress Notes (Signed)
   Subjective:    Patient ID: Robin [name], female    DOB: Feb 22, 1986, 32 y.o.   MRN: [id number]  HPI  32 year old female presents for preconceptual counseling. Patient had a history of a subchorionic hemorrhage first seen on 8 week ultrasound at a [facility] facility.  Patient was thought to have ruptured membranes around 18 weeks and also noted to have a large subchorionic bleed. It is unsure whether her rupture and subsequent oligohydramnios was due to infection from the large subchorionic hemorrhage. The patient had heavy bleeding at 28 weeks and needed an emergency C-section. The abruption had increased. 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 There was no evidence of cervical insufficiency during this pregnancy. Patient has not had a thrombophilia workup at this point. Also of note the patient think she may have rheumatoid arthritis. She has transient swelling of joints that become red and painful. She is getting a repeat rheumatology referral from her primary care doctor. Attempting to control the symptoms with diet.  Patient is thinking of becoming pregnant at the end of 2019. She would like to VBAC and had a water birth. She [facility] not allow [facility].  Review of Systems  Constitutional: Negative.   Genitourinary: Negative.   Musculoskeletal: Positive for arthralgias and joint swelling.  Psychiatric/Behavioral: Negative.        Objective:   Physical Exam  Constitutional: She is oriented to person, place, and time. She appears well-developed and well-nourished. No distress.  HENT:  Head: Normocephalic and atraumatic.  Eyes: Conjunctivae are normal.  Pulmonary/Chest: Effort normal.  Musculoskeletal: She exhibits no edema.  Neurological: She is alert and oriented to person, place, and time.  Skin: Skin is warm and dry.  Psychiatric: She has a normal mood and affect.  Vitals  reviewed.  Vitals:   07/26/17 0947  BP: 104/66  Pulse: 76  Weight: 103 lb (46.7 kg)    Assessment & Plan:  32 year old female with preconceptual counseling after subchorionic hemorrhage, P PROM with oligo in mid 2nd trimester, placental abruption.  Will get a POA workup and referred to maternal fetal medicine for preconceptual consult.  Patient is no longer taking antidepression medication and is doing well.  25 minutes spent face-to-face with the patient with greater than 50% counseling.

## 2017-07-27 LAB — CARDIOLIPIN ANTIBODIES, IGG, IGM, IGA
Anticardiolipin IgA: <11 [APL'U]
Anticardiolipin IgG: <14 [GPL'U]
Anticardiolipin IgM: <12 [MPL'U]

## 2017-07-27 LAB — TIQ-NTM

## 2017-07-28 ENCOUNTER — Ambulatory Visit (HOSPITAL_COMMUNITY)
Admission: RE | Admit: 2017-07-28 | Discharge: 2017-07-28 | Disposition: A | Discharge status: Home / Self Care | Payer: 59 | Source: Ambulatory Visit | Attending: Obstetrics & Gynecology | Admitting: Obstetrics & Gynecology

## 2017-07-28 ENCOUNTER — Encounter (HOSPITAL_COMMUNITY): Payer: Self-pay

## 2017-07-28 DIAGNOSIS — Z8489 Family history of other specified conditions: Secondary | ICD-10-CM | POA: Diagnosis not present

## 2017-07-28 DIAGNOSIS — Z8751 Personal history of pre-term labor: Secondary | ICD-10-CM | POA: Diagnosis not present

## 2017-07-28 DIAGNOSIS — Z87891 Personal history of nicotine dependence: Secondary | ICD-10-CM | POA: Insufficient documentation

## 2017-07-28 DIAGNOSIS — Z3169 Encounter for other general counseling and advice on procreation: Secondary | ICD-10-CM | POA: Insufficient documentation

## 2017-07-28 DIAGNOSIS — Z8249 Family history of ischemic heart disease and other diseases of the circulatory system: Secondary | ICD-10-CM | POA: Insufficient documentation

## 2017-07-28 DIAGNOSIS — Z9889 Other specified postprocedural states: Secondary | ICD-10-CM | POA: Insufficient documentation

## 2017-07-28 DIAGNOSIS — F329 Major depressive disorder, single episode, unspecified: Secondary | ICD-10-CM | POA: Insufficient documentation

## 2017-07-28 DIAGNOSIS — J45909 Unspecified asthma, uncomplicated: Secondary | ICD-10-CM | POA: Insufficient documentation

## 2017-07-28 HISTORY — DX: Unspecified abnormal cytological findings in specimens from vagina: R87.629

## 2017-07-28 NOTE — ED Notes (Addendum)
[facility] for preconception counseling with Dr. Otho PerlNitsche.  LMP 07-02-17.  BP 104/73, HR 89.  Wt. 107.8lbs.

## 2017-07-28 NOTE — Consult Note (Signed)
MATERNAL FETAL MEDICINE CONSULT  Patient Name: Robin [name] Medical Record Number:  [id number] Date of Birth: 04-19-1986 Requesting Physician Name:  Lesly DukesLeggett, Kelly H, MD Date of Service: 07/28/2017  Chief Complaint PRROM at 18 weeks in prior pregnancy  History of Present Illness Robin CarolLindsay C Bonaventure is a 32 y.o. [id number] who is here today for preconceptual counseling.  She developed a subchorionic hemorrhage and experienced PPROM at approximately 18 weeks in her most recent pregnancy.  She had prolonged severe oligohydramnios and ultimately delivered via emergency LTCS due to an abruption at 28 weeks.  That infant died approximately 32 hours after birth due to pulmonary hypoplasia.  Prior to this most recent pregnancy she had three prior uncomplicated pregnancies that ending in NSVDs at term and one missed abortion diagnosed at 9 weeks.  She has no acute issues or complaints today.  She wishes to discuss the chance of recurrent PPROM, abruption, and preterm delivery in any future pregnancies.  Review of Systems Pertinent items are noted in HPI.  Patient History OB History  Gravida Para Term Preterm AB Living  5 4 3 1 1 3   SAB TAB Ectopic Multiple Live Births  1       3    # Outcome Date GA Lbr Len/2nd Weight Sex Delivery Anes PTL Lv  5 Term 12/08/15 3058w1d   F      4 SAB 01/31/14 6061w2d    SAB   FD  3 Term 08/09/12 5461w0d  7 lb 6.5 oz (3.36 kg) F Vag-Spont   LIV     Birth Comments: BORN EN ROUTE TO [facility]  2 Term 07/16/10 2918w3d 12:00 7 lb 7 oz (3.374 kg) M Vag-Spont EPI Y LIV     Birth Comments: No complications  1 Preterm               Past Medical History:  Diagnosis Date  . Abnormal Pap smear 2008   HAD HPV;HAD GENITAL WART REMOVED;LAST PAP 07/2010 WAS NORMAL  . Anemia    FeSO4 SUPP TAKEN IN PAST  . Asthma    AS A CHILD;GREW OUT OF @ 8 YOA  . Depression   . History of smoking 12/26/2011   Reports quit '12  . Infection    UTI;CAN GET FREQ  . Vaginal Pap smear, abnormal     Past  Surgical History:  Procedure Laterality Date  . CESAREAN SECTION N/A 09/09/2016   Procedure: CESAREAN SECTION;  Surgeon: Tereso NewcomerUgonna A Anyanwu, MD;  Location: [facility];  Service: Obstetrics;  Laterality: N/A;  . NASAL SINUS SURGERY  2016    Social History   Socioeconomic History  . Marital status: Married    Spouse name: [place] Stubbe  . Number of children: 1  . Years of education: None  . Highest education level: None  Social Needs  . Financial resource strain: None  . Food insecurity - worry: None  . Food insecurity - inability: None  . Transportation needs - medical: None  . Transportation needs - non-medical: None  Occupational History  . Occupation: SHIFT SUPERVISOR    Employer: [employer]  Tobacco Use  . Smoking status: Former Smoker    Types: Cigarettes    Last attempt to quit: 06/27/2011    Years since quitting: 6.0  . Smokeless tobacco: Never Used  Substance and Sexual Activity  . Alcohol use: No    Comment: BI-WEEKLY PRIOR TO PREGNANCY  . Drug use: No  . Sexual activity: Yes  Partners: Male    Birth control/protection: None  Other Topics Concern  . None  Social History Narrative  . None    Family History  Problem Relation Age of Onset  . Mitral valve prolapse Mother        DIED @ 96 YOA OF CONDITION  . Mitral valve prolapse Paternal Aunt   . Heart attack Paternal Grandfather        DECEASED  . Dementia Maternal Grandmother    In addition, the patient has no family history of mental retardation, birth defects, or genetic diseases.  Physical Examination There were no vitals filed for this visit. General appearance - alert, well appearing, and in no distress Mental status - alert, oriented to person, place, and time  Assessment and Recommendations 1.  Prior PPROM, abruption, and 28 week emergency LTCS  As Ms. Lyman has had 3 prior uncomplicated pregnancies and the subchorionic hemorrhage in her previous pregnancy can be seen as the inciting event  leading to all of her complications, it is unlikely they will recur in any future pregnancies.  Even though Ms. Nell likely experienced PPROM in her last pregnancy as a result of the subchorionic hemorrhage she would qualify for 17-OH progesterone and serial cervical length measurements starting at 16 weeks in any future pregnancies.  Cerclage would be reserved for evidence of cervical shortening to less than 2.5 cm prior to 23 weeks. 2.  Child with developmental delay  Upon review of Ms. Cosner family history I learned that her 54 month old daughter has significant developmental delay and macrocephaly.  Her daughter has been evaluated by a neurologist and had some genetic testing, but a diagnosis has not been reached.  She is scheduled to seen a medical geneticist in [place] soon to continue the search for the cause.  If a diagnosis is confirmed Ms. Grisanti may benefit from genetic counseling to discuss recurrence risk and testing options in any future pregnancies.  I spent 15 minutes with Ms. Weatherman today of which 50% was face-to-face counseling.  Thank you for referring Ms. Thackston to the [facility].  Please do not hesitate to contact us with questions.   Rema Fendt, MD

## 2017-07-29 LAB — LUPUS ANTICOAGULANT EVAL W/ REFLEX
PTT LA SCREEN: 39 s (ref <=40)
dRVVT Screen: 28 s (ref <=45)

## 2017-07-29 LAB — BETA-2 GLYCOPROTEIN ANTIBODIES: Beta-2 Glyco 1 IgM: <9 SMU (ref <=20)

## 2017-07-29 LAB — EXTRA BLUE TOP TUBE

## 2017-08-02 ENCOUNTER — Encounter: Payer: Self-pay | Admitting: Obstetrics & Gynecology

## 2017-11-16 ENCOUNTER — Encounter: Payer: Self-pay | Admitting: *Deleted

## 2017-11-17 ENCOUNTER — Ambulatory Visit (INDEPENDENT_AMBULATORY_CARE_PROVIDER_SITE_OTHER): Payer: 59 | Admitting: Advanced Practice Midwife

## 2017-11-17 ENCOUNTER — Encounter: Payer: Self-pay | Admitting: Advanced Practice Midwife

## 2017-11-17 DIAGNOSIS — Z8279 Family history of other congenital malformations, deformations and chromosomal abnormalities: Secondary | ICD-10-CM | POA: Insufficient documentation

## 2017-11-17 DIAGNOSIS — Z98891 History of uterine scar from previous surgery: Secondary | ICD-10-CM

## 2017-11-17 DIAGNOSIS — O0991 Supervision of high risk pregnancy, unspecified, first trimester: Secondary | ICD-10-CM

## 2017-11-17 DIAGNOSIS — Z8751 Personal history of pre-term labor: Secondary | ICD-10-CM | POA: Insufficient documentation

## 2017-11-17 DIAGNOSIS — O09211 Supervision of pregnancy with history of pre-term labor, first trimester: Secondary | ICD-10-CM

## 2017-11-17 DIAGNOSIS — O0990 Supervision of high risk pregnancy, unspecified, unspecified trimester: Secondary | ICD-10-CM | POA: Insufficient documentation

## 2017-11-17 NOTE — Progress Notes (Signed)
  Subjective:    Robin [name] is being seen today for her first obstetrical visit.  This is a planned pregnancy. She is at 10076w2d gestation. Her obstetrical history is significant for hx of abruption with c-section and subsequent neonatal demise. . Relationship with FOB: significant other, living together. Patient does intend to breast feed. Pregnancy history fully reviewed.  Patient reports no complaints.  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   Review of Systems:   Review of Systems  All other systems reviewed and are negative.   Objective:     BP 110/66   Pulse (!) 105   LMP 10/04/2017  Physical Exam  Nursing note and vitals reviewed. Constitutional: She is oriented to person, place, and time. She appears well-developed and well-nourished.  Cardiovascular: Normal rate.  Respiratory: Effort normal.  Musculoskeletal: Normal range of motion.  Neurological: She is alert and oriented to person, place, and time.  Psychiatric: She has a normal mood and affect.      Fetal Exam Fetal Monitor Review: Mode: ultrasound.   Baseline rate: + visible cardiac activity .         Assessment:    Pregnancy: [id number] Patient Active Problem List   Diagnosis Date Noted  . History of preterm delivery 11/17/2017  . Supervision of high risk pregnancy, antepartum 11/17/2017  . Previous cesarean section 11/17/2017  . Family history of congenital or genetic condition 11/17/2017  . History of premature rupture of membranes at 6718, with subsequent abruption and 28 week delivery & Neonatal demise 10/12/2016  . Dysuria   . Family history of mitral valve prolapse 07/27/2015  . Family hx of hydrocephalus 04/03/2012  . Family hx of bicuspid aortic valve--FOB 04/03/2012  . History of abnormal Pap smear 01/02/2012  . history of genital warts 12/26/2011  . History of smoking 12/26/2011       Plan:     Initial labs drawn. Prenatal vitamins. Problem  list reviewed and updated. AFP3 discussed: declined. Declines any genetic testing at this point, will reconsider as needed.  Role of ultrasound in pregnancy discussed; fetal survey: requested. Amniocentesis discussed: not indicated. Follow up in 2 weeks for FU US, and completion of new OB visit.  50% of 45 min visit spent on counseling and coordination of care.  Patient and husband decline genetic counseling today Recommend: serial cervical lengths, 17-p per MFM. Patient and husband will consider these choices.   Desires VBAC     Thressa ShellerHeather Tezra Mahr 11/17/2017

## 2017-11-17 NOTE — Progress Notes (Signed)
DATING AND VIABILITY SONOGRAM   Robin [name] is a 32 y.o. year old [phone] with LMP Patient's last menstrual period was 10/04/2017. which would correlate to  6144w2d weeks gestation.  She has regular} menstrual cycles.   She is here today for a confirmatory initial sonogram.       FETAL ACTIVITY:          Heart rate       Slow          The fetus is inactive.          ADNEXA: The ovaries are normal. With RT CL   GESTATIONAL AGE AND  BIOMETRICS:  Gestational criteria: Estimated Date of Delivery: 07/11/18 by LMP now at 4744w2d    GESTATIONAL SAC    9x4      mm     5.6weeks  CROWN RUMP LENGTH           2.7 mm         5.6 weeks                                                                               AVERAGE EGA(BY THIS SCAN):5 weeks 6d  WORKING EDD( early ultrasound ):  07/14/18     TECHNICIAN COMMENTS:  Small subchorionic hemorrhage noted   A copy of this report including all images has been saved and backed up to a second source for retrieval if needed. All measures and details of the anatomical scan, placentation, fluid volume and pelvic anatomy are contained in that report.  Blossom HoopsClark, Lindzee Gouge Lee 11/17/2017 11:58 AM

## 2017-11-17 NOTE — Patient Instructions (Signed)
MFM recommends  Cervical length ultrasounds starting at 16 weeks 17-P (progesterone injections starting at 16 weeks Genetic counseling

## 2017-12-01 ENCOUNTER — Ambulatory Visit (INDEPENDENT_AMBULATORY_CARE_PROVIDER_SITE_OTHER): Payer: 59 | Admitting: Certified Nurse Midwife

## 2017-12-01 ENCOUNTER — Other Ambulatory Visit (HOSPITAL_COMMUNITY)
Admission: RE | Admit: 2017-12-01 | Discharge: 2017-12-01 | Disposition: A | Discharge status: Home / Self Care | Payer: 59 | Source: Ambulatory Visit | Attending: Certified Nurse Midwife | Admitting: Certified Nurse Midwife

## 2017-12-01 ENCOUNTER — Encounter: Payer: Self-pay | Admitting: Certified Nurse Midwife

## 2017-12-01 VITALS — BP 115/69 | HR 94 | Wt 108.0 lb

## 2017-12-01 DIAGNOSIS — Z87798 Personal history of other (corrected) congenital malformations: Secondary | ICD-10-CM

## 2017-12-01 DIAGNOSIS — O09211 Supervision of pregnancy with history of pre-term labor, first trimester: Secondary | ICD-10-CM | POA: Insufficient documentation

## 2017-12-01 DIAGNOSIS — Z8279 Family history of other congenital malformations, deformations and chromosomal abnormalities: Secondary | ICD-10-CM | POA: Diagnosis not present

## 2017-12-01 DIAGNOSIS — G919 Hydrocephalus, unspecified: Secondary | ICD-10-CM

## 2017-12-01 DIAGNOSIS — Z3687 Encounter for antenatal screening for uncertain dates: Secondary | ICD-10-CM | POA: Diagnosis not present

## 2017-12-01 DIAGNOSIS — O0991 Supervision of high risk pregnancy, unspecified, first trimester: Secondary | ICD-10-CM

## 2017-12-01 DIAGNOSIS — Z8751 Personal history of pre-term labor: Secondary | ICD-10-CM

## 2017-12-01 DIAGNOSIS — O34219 Maternal care for unspecified type scar from previous cesarean delivery: Secondary | ICD-10-CM | POA: Insufficient documentation

## 2017-12-01 DIAGNOSIS — Z3A08 8 weeks gestation of pregnancy: Secondary | ICD-10-CM | POA: Insufficient documentation

## 2017-12-01 DIAGNOSIS — O0990 Supervision of high risk pregnancy, unspecified, unspecified trimester: Secondary | ICD-10-CM

## 2017-12-01 DIAGNOSIS — A630 Anogenital (venereal) warts: Secondary | ICD-10-CM

## 2017-12-01 DIAGNOSIS — Z98891 History of uterine scar from previous surgery: Secondary | ICD-10-CM

## 2017-12-01 NOTE — Addendum Note (Signed)
Addended by: Kathie Dike on: 12/01/2017 11:24 AM   Modules accepted: Orders

## 2017-12-01 NOTE — Progress Notes (Signed)
Subjective:  MENAAL RUSSUM is a 32 y.o. [phone] at [redacted]w[redacted]d being seen today for ongoing prenatal care.  She is currently monitored for the following issues for this high-risk pregnancy and has Family hx of bicuspid aortic valve--FOB; History of preterm delivery; Supervision of high risk pregnancy, antepartum; Previous cesarean section; and Family history of congenital or genetic condition on their problem list.  Patient reports no complaints.   . Vag. Bleeding: None.   . Denies leaking of fluid.   The following portions of the patient's history were reviewed and updated as appropriate: allergies, current medications, past family history, past medical history, past social history, past surgical history and problem list. Problem list updated.  Objective:   Vitals:   12/01/17 0959  BP: 115/69  Pulse: 94  Weight: 108 lb (49 kg)    Fetal Status: Fetal Heart Rate (bpm): 171         General:  Alert, oriented and cooperative. Patient is in no acute distress.  Skin: Skin is warm and dry. No rash noted.   Cardiovascular: Normal heart rate noted  Respiratory: Normal respiratory effort, no problems with respiration noted  Abdomen: Soft, gravid, appropriate for gestational age. Pain/Pressure: Absent     Pelvic: Vag. Bleeding: None Vag D/C Character: Thin   Cervical exam deferred        Extremities: Normal range of motion.     Mental Status: Normal mood and affect. Normal behavior. Normal judgment and thought content.   Urinalysis: Urine Protein: Negative Urine Glucose: Negative  Assessment and Plan:  Pregnancy: [id number] at [redacted]w[redacted]d  1. Supervision of high risk pregnancy, antepartum - POCT bedside ultrasound - Prenatal labs  2. Previous cesarean section - LTCS 2/2 abruption  wks - desires TOLAC  3. 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 History of preterm  delivery - start 17-OHP  wks - serial cervical lengths   Preterm labor symptoms and general obstetric precautions including but not limited to vaginal bleeding, contractions, leaking of fluid and fetal movement were reviewed in detail with the patient. Please refer to After Visit Summary for other counseling recommendations.  Return in about 1 month (around 12/29/2017).   Donette Larry, CNM

## 2017-12-01 NOTE — Progress Notes (Signed)
Bedside U/S shows single IUP with CRL measurement of [redacted]w[redacted]d  Pt wants to defer pap until after delivery

## 2017-12-04 LAB — OBSTETRIC PANEL
Antibody Screen: NOT DETECTED
Basophils Absolute: 74 cells/uL (ref 0–200)
Basophils Relative: 1 %
EOS PCT: 2.9 %
Eosinophils Absolute: 215 cells/uL (ref 15–500)
HCT: 37.4 % (ref 35.0–45.0)
HEP B S AG: NONREACTIVE
Hemoglobin: 13 g/dL (ref 11.7–15.5)
LYMPHS ABS: 1317 {cells}/uL (ref 850–3900)
MCH: 31 pg (ref 27.0–33.0)
MCHC: 34.8 g/dL (ref 32.0–36.0)
MCV: 89 fL (ref 80.0–100.0)
MPV: 10.2 fL (ref 7.5–12.5)
Monocytes Relative: 7.6 %
NEUTROS PCT: 70.7 %
Neutro Abs: 5232 cells/uL (ref 1500–7800)
PLATELETS: 260 10*3/uL (ref 140–400)
RBC: 4.2 10*6/uL (ref 3.80–5.10)
RDW: 12.2 % (ref 11.0–15.0)
RPR: NONREACTIVE
RUBELLA: 1.5 {index}
Total Lymphocyte: 17.8 %
WBC: 7.4 10*3/uL (ref 3.8–10.8)
WBCMIX: 562 {cells}/uL (ref 200–950)

## 2017-12-04 LAB — HIV ANTIBODY (ROUTINE TESTING W REFLEX): HIV: NONREACTIVE

## 2017-12-05 LAB — CULTURE, OB URINE

## 2017-12-05 LAB — GC/CHLAMYDIA PROBE AMP (~~LOC~~) NOT AT ARMC
CHLAMYDIA, DNA PROBE: NEGATIVE
Neisseria Gonorrhea: NEGATIVE

## 2017-12-05 LAB — URINE CULTURE, OB REFLEX

## 2017-12-15 ENCOUNTER — Ambulatory Visit (HOSPITAL_COMMUNITY)
Admission: RE | Admit: 2017-12-15 | Discharge: 2017-12-15 | Disposition: A | Discharge status: Home / Self Care | Payer: 59 | Source: Ambulatory Visit | Attending: Obstetrics & Gynecology | Admitting: Obstetrics & Gynecology

## 2017-12-15 DIAGNOSIS — O352XX0 Maternal care for (suspected) hereditary disease in fetus, not applicable or unspecified: Secondary | ICD-10-CM | POA: Insufficient documentation

## 2017-12-15 DIAGNOSIS — Z3A10 10 weeks gestation of pregnancy: Secondary | ICD-10-CM

## 2017-12-15 NOTE — Progress Notes (Signed)
Genetic Counseling  Visit Summary Note  Appointment Date: 12/15/2017 Referred By: Osborne Oman, MD  Date of Birth: 10-16-1985  Pregnancy history: [id number] Estimated Date of Delivery: 07/11/18 Estimated Gestational Age: [redacted]w[redacted]d I met with Mrs. Robin CLYNEand her husband, JAntonya Leeder for genetic counseling because of a child with a chromosome condition.  In summary:  Discussed family history of daughter with chromosome deletion  Reviewed chances for recurrence - unclear without seeing daughter's results  Discussed options of screening / testing  Amniocentesis or CVS - declined  MaterniT genome if deletion size is greater than 7MB and not maternally inherited - declined  Discussed general population carrier screening options - declined  CF  SMA  Hemoglobinopathies  We began by reviewing the family history in detail. Mrs. GDonahoereported that they have three living children, one miscarriage and one preterm son who passed away.  Their two year old daughter was identified as having developmental delays, hypotonia and macrocephaly.  [facility] genetics [facility] [place] [facility] revealed a chromosome deletion.  Mrs. GYankeedid not bring a copy of their daughter's results, and was unsure if the deletion was identified cytogenetically, by microarray or by sequencing.  Mrs. GBaistated that blood was taken from her and her husband, but they had not yet been tested for the deletion because of the cost and lack of insurance coverage for genetic testing.    We discussed that some deletions are inherited from a parent with a balanced chromosome translocation and some occur as new events, spontaneously for a child.  Without seeing the report for their daughter and having testing for Mrs. GZimnyand her husband, it is not possible to know if this is inherited or a spontaneous change for their daughter.  Thus, a specific recurrence chance is not possible.  Mrs. GMarquartwas encouraged to  reach back out to the geneticist that met with her daughter and see if she could have testing performed, now that she has pregnancy medicaid.   The family histories were otherwise found to be noncontributory for birth defects, mental retardation, and known genetic conditions. Without further information regarding the provided family history, an accurate genetic risk cannot be calculated. Further genetic counseling is warranted if more information is obtained.  We reviewed available screening and diagnostic options.  Regarding screening tests, we discussed the option of MaterniT Genome. They were counseled that this screening option is able to evaluate cell free DNA fragments for deletions or duplications of genetic maternal and can identify fragments as small as 7MB.  However, if Mrs. GDebohad the same deletion identified in her daughter, this testing would not be informative for this couple.  If their daughter's deletion was smaller than 7MB, it would not be identified with this screening modality.  We also reviewed the availability of diagnostic options including CVS and amniocentesis.  We discussed the risks, limitations, and benefits of each.  Again, we would need to know the exact change present in her daughter and the methodology used to identify that change (karyotype, array or sequencing) to ensure that a similar difference would be identified on prenatal testing.  We discussed the possible results that the tests might provide including: positive, negative, unanticipated, and no result. Finally, they were counseled regarding the cost of each option and potential out of pocket expenses.  They declined invasive testing at this time.  They may consider NIPS, but would need to determine the results of their daughter's testing and potentially  have Robin [name] be tested first, to ensure that this screen would be informative.  Robin [name] was provided with written information regarding cystic fibrosis (CF),  spinal muscular atrophy (SMA) and hemoglobinopathies including the carrier frequency, availability of carrier screening and prenatal diagnosis if indicated.  In addition, we discussed that CF and hemoglobinopathies are routinely screened for as part of the [place] newborn screening panel.  After further discussion, she declined screening for CF, SMA and hemoglobinopathies.  Robin [name] denied exposure to environmental toxins or chemical agents. She denied the use of alcohol, tobacco or street drugs. She denied significant viral illnesses during the course of her pregnancy. Her medical and surgical histories were noncontributory.   I counseled this couple regarding the above risks and available options.  The approximate face-to-face time with the genetic counselor was 45 minutes.  Robin [name] was provided with my direct contact information so that she can contact me directly if she learns more information or wishes to discuss additional screening.  [name], MS Certified Genetic Counselor

## 2017-12-29 ENCOUNTER — Ambulatory Visit (INDEPENDENT_AMBULATORY_CARE_PROVIDER_SITE_OTHER): Payer: 59 | Admitting: Certified Nurse Midwife

## 2017-12-29 VITALS — BP 98/63 | HR 99 | Wt 110.0 lb

## 2017-12-29 DIAGNOSIS — O0990 Supervision of high risk pregnancy, unspecified, unspecified trimester: Secondary | ICD-10-CM

## 2017-12-29 DIAGNOSIS — Z8751 Personal history of pre-term labor: Secondary | ICD-10-CM

## 2017-12-29 DIAGNOSIS — Z8279 Family history of other congenital malformations, deformations and chromosomal abnormalities: Secondary | ICD-10-CM

## 2017-12-29 NOTE — Progress Notes (Signed)
Subjective:  Robin [name] is a 32 y.o. [id number] at 2687w2d being seen today for ongoing prenatal care.  She is currently monitored for the following issues for this high-risk pregnancy and has Family hx of bicuspid aortic valve--FOB; History of preterm delivery; Supervision of high risk pregnancy, antepartum; Previous cesarean section; Family history of congenital or genetic condition; [redacted] weeks gestation of pregnancy; and Hereditary disease in family possibly affecting fetus, affecting management of mother, antepartum condition or complication, not applicable or unspecified fetus on their problem list.  Patient reports no complaints.  Contractions: Not present. Vag. Bleeding: None.  Movement: Absent. Denies leaking of fluid.   The following portions of the patient's history were reviewed and updated as appropriate: allergies, current medications, past family history, past medical history, past social history, past surgical history and problem list. Problem list updated.  Objective:   Vitals:   12/29/17 1008  BP: 98/63  Pulse: 99  Weight: 110 lb (49.9 kg)    Fetal Status: Fetal Heart Rate (bpm): 152   Movement: Absent     General:  Alert, oriented and cooperative. Patient is in no acute distress.  Skin: Skin is warm and dry. No rash noted.   Cardiovascular: Normal heart rate noted  Respiratory: Normal respiratory effort, no problems with respiration noted  Abdomen: Soft, gravid, appropriate for gestational age. Pain/Pressure: Absent     Pelvic: Vag. Bleeding: None Vag D/C Character: Thin   Cervical exam deferred        Extremities: Normal range of motion.  Edema: None  Mental Status: Normal mood and affect. Normal behavior. Normal judgment and thought content.   Urinalysis: Urine Protein: Negative Urine Glucose: Negative  Assessment and Plan:  Pregnancy: [id number] at 4987w2d  1. History of preterm delivery - recommend Makena, pt agrees - application started - serial cervical  lengths  2. Supervision of high risk pregnancy, antepartum  3. 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 Please refer to After Visit Summary for other counseling recommendations.  Return in about 1 month (around 01/26/2018).   Donette LarryBhambri, Mindie Rawdon, CNM

## 2018-01-26 ENCOUNTER — Encounter: Payer: Self-pay | Admitting: Advanced Practice Midwife

## 2018-01-26 ENCOUNTER — Ambulatory Visit (INDEPENDENT_AMBULATORY_CARE_PROVIDER_SITE_OTHER): Payer: 59 | Admitting: Advanced Practice Midwife

## 2018-01-26 VITALS — BP 91/52 | HR 93 | Wt 113.0 lb

## 2018-01-26 DIAGNOSIS — O0992 Supervision of high risk pregnancy, unspecified, second trimester: Secondary | ICD-10-CM | POA: Diagnosis not present

## 2018-01-26 DIAGNOSIS — L989 Disorder of the skin and subcutaneous tissue, unspecified: Secondary | ICD-10-CM

## 2018-01-26 DIAGNOSIS — O09212 Supervision of pregnancy with history of pre-term labor, second trimester: Secondary | ICD-10-CM | POA: Diagnosis not present

## 2018-01-26 DIAGNOSIS — O0990 Supervision of high risk pregnancy, unspecified, unspecified trimester: Secondary | ICD-10-CM

## 2018-01-26 DIAGNOSIS — N926 Irregular menstruation, unspecified: Secondary | ICD-10-CM | POA: Insufficient documentation

## 2018-01-26 MED ORDER — DOXYLAMINE SUCCINATE (SLEEP) 25 MG PO TABS: 25.0000 mg | ORAL_TABLET | Freq: Every evening | ORAL | 0 refills | Status: DC | PRN

## 2018-01-26 MED ORDER — VITAMIN B-6 50 MG PO TABS: 50.0000 mg | ORAL_TABLET | Freq: Every day | ORAL | 3 refills | Status: DC

## 2018-01-26 MED ORDER — HYDROXYPROGESTERONE CAPROATE 250 MG/ML IM OIL
250.0000 mg | TOPICAL_OIL | Freq: Once | INTRAMUSCULAR | Status: AC
  Administered 2018-01-26: 250 mg via INTRAMUSCULAR

## 2018-01-26 NOTE — Progress Notes (Signed)
   PRENATAL VISIT NOTE  Subjective:  Robin [name] is a 32 y.o. [id number] at 7342w2d being seen today for ongoing prenatal care.  She is currently monitored for the following issues for this high-risk pregnancy and has Family hx of bicuspid aortic valve--FOB; Family history of congenital heart disease; History of preterm delivery; Supervision of high risk pregnancy, antepartum; Previous cesarean section; Family history of congenital anomaly; [redacted] weeks gestation of pregnancy; Hereditary disease in family possibly affecting fetus, affecting management of mother, antepartum condition or complication, not applicable or unspecified fetus; Irregular periods; and Miscarriage on their problem list.  Patient reports nausea and skin lesion on left chest, bleeds easily.  Contractions: Not present. Vag. Bleeding: None.  Movement: Absent. Denies leaking of fluid.   The following portions of the patient's history were reviewed and updated as appropriate: allergies, current medications, past family history, past medical history, past social history, past surgical history and problem list. Problem list updated.  Objective:   Vitals:   01/26/18 0959  BP: (!) 91/52  Pulse: 93  Weight: 113 lb (51.3 kg)    Fetal Status: Fetal Heart Rate (bpm): 150   Movement: Absent     General:  Alert, oriented and cooperative. Patient is in no acute distress.  Skin: Skin is warm and dry. No rash noted.   Cardiovascular: Normal heart rate noted  Respiratory: Normal respiratory effort, no problems with respiration noted  Abdomen: Soft, gravid, appropriate for gestational age.  Pain/Pressure: Absent     Pelvic: Cervical exam deferred        Extremities: Normal range of motion.  Edema: None  Mental Status: Normal mood and affect. Normal behavior. Normal judgment and thought content.   Assessment and Plan:  Pregnancy: A5W0981G6P3113 at 3742w2d  1. Skin lesion      Advised referral to Derm - Ambulatory referral to Dermatology  2.  Supervision of high risk pregnancy, antepartum, second trimester      Will set up US.      Starting Makena today  - US MFM OB DETAIL +14 WK; Future - hydroxyprogesterone caproate (MAKENA) 250 mg/mL injection 250 mg  3. Supervision of high risk pregnancy, antepartum     Reviewed warning signs to report/come in for  Preterm labor symptoms and general obstetric precautions including but not limited to vaginal bleeding, contractions, leaking of fluid and fetal movement were reviewed in detail with the patient. Please refer to After Visit Summary for other counseling recommendations.    Future Appointments  Date Time Provider Department Center  02/02/2018  9:00 AM [facility] NURSE [facility]  02/20/2018  9:30 AM [facility] US 1 [facility]  02/23/2018  9:30 AM Katrinka BlazingSmith, [place], CNM [facility]    Wynelle BourgeoisMarie Carmelita Amparo, CNM

## 2018-01-26 NOTE — Progress Notes (Deleted)
   PRENATAL VISIT NOTE  Subjective:  Robin [name] is a 32 y.o. [id number] at 2294w2d being seen today for ongoing prenatal care.  She is currently monitored for the following issues for this {Blank single:19197::"high-risk","low-risk"} pregnancy and has Family hx of bicuspid aortic valve--FOB; Family history of congenital heart disease; History of preterm delivery; Supervision of high risk pregnancy, antepartum; Previous cesarean section; Family history of congenital anomaly; [redacted] weeks gestation of pregnancy; Hereditary disease in family possibly affecting fetus, affecting management of mother, antepartum condition or complication, not applicable or unspecified fetus; Irregular periods; and Miscarriage on their problem list.  Patient reports {sx:[id number]}.  Contractions: Not present. Vag. Bleeding: None.  Movement: Absent. Denies leaking of fluid.   The following portions of the patient's history were reviewed and updated as appropriate: allergies, current medications, past family history, past medical history, past social history, past surgical history and problem list. Problem list updated.  Objective:   Vitals:   01/26/18 0959  BP: (!) 91/52  Pulse: 93  Weight: 113 lb (51.3 kg)    Fetal Status: Fetal Heart Rate (bpm): 150   Movement: Absent     General:  Alert, oriented and cooperative. Patient is in no acute distress.  Skin: Skin is warm and dry. No rash noted.   Cardiovascular: Normal heart rate noted  Respiratory: Normal respiratory effort, no problems with respiration noted  Abdomen: Soft, gravid, appropriate for gestational age.  Pain/Pressure: Absent     Pelvic: {Blank single:19197::"Cervical exam performed","Cervical exam deferred"}        Extremities: Normal range of motion.  Edema: None  Mental Status: Normal mood and affect. Normal behavior. Normal judgment and thought content.   Assessment and Plan:  Pregnancy: [id number] at 5094w2d  1. Skin lesion *** - Ambulatory referral to  Dermatology  2. Supervision of high risk pregnancy, antepartum, second trimester *** - US MFM OB DETAIL +14 WK; Future - hydroxyprogesterone caproate (MAKENA) 250 mg/mL injection 250 mg  3. Supervision of high risk pregnancy, antepartum ***  {Blank single:19197::"Term","Preterm"} labor symptoms and general obstetric precautions including but not limited to vaginal bleeding, contractions, leaking of fluid and fetal movement were reviewed in detail with the patient. Please refer to After Visit Summary for other counseling recommendations.  No follow-ups on file.  Future Appointments  Date Time Provider Department Center  02/02/2018  9:00 AM [facility] NURSE [facility]  02/20/2018  9:30 AM [facility] US 1 [facility]  02/23/2018  9:30 AM Katrinka BlazingSmith, [place], CNM [facility]    Wynelle BourgeoisMarie Micky Overturf, CNM

## 2018-01-26 NOTE — Patient Instructions (Signed)
Progesterone injection What is this medicine? PROGESTERONE (proe JES ter one) is a female hormone. It is used to treat missed menstrual periods or abnormal uterine bleeding caused by a hormone imbalance. This medicine may be used for other purposes; ask your health care provider or pharmacist if you have questions. What should I tell my health care provider before I take this medicine? They need to know if you have any of these conditions: -blood vessel disease, blood clotting disorder, or suffered a stroke -breast, cervical or vaginal cancer -diabetes -heart disease -liver disease -recent abortion, miscarriage -vaginal bleeding -an unusual or allergic reaction to progesterone, other hormones, medicines, foods, dyes, or preservatives -pregnant or trying to get pregnant -breast-feeding How should I use this medicine? This medicine is for injection into a muscle. It is usually given by a health care professional in a hospital or clinic setting. If you get this medicine at home, you will be taught how to prepare and give this medicine. Use exactly as directed. Take your medicine at regular intervals. Do not take your medicine more often than directed. It is important that you put your used needles and syringes in a special sharps container. Do not put them in a trash can. If you do not have a sharps container, call your pharmacist or healthcare provider to get one. Talk to your pediatrician regarding the use of this medicine in children. Special care may be needed. Overdosage: If you think you have taken too much of this medicine contact a poison control center or emergency room at once. NOTE: This medicine is only for you. Do not share this medicine with others. What if I miss a dose? If you miss a dose, take it as soon as you can. If it is almost time for your next dose, take only that dose. Do not take double or extra doses. What may interact with this medicine? Do not take this medicine  with any of the following medications: -bosentan This medicine may also interact with the following medications: -barbiturate medicines for sleep or seizures -bexarotene -carbamazepine -ethotoin -ketoconazole -phenytoin -rifampin This list may not describe all possible interactions. Give your health care provider a list of all the medicines, herbs, non-prescription drugs, or dietary supplements you use. Also tell them if you smoke, drink alcohol, or use illegal drugs. Some items may interact with your medicine. What should I watch for while using this medicine? Your condition will be monitored carefully while you are receiving this medicine. Tell your doctor or healthcare professional if your symptoms do not start to get better or if they get worse. What side effects may I notice from receiving this medicine? Side effects that you should report to your doctor or health care professional as soon as possible: -allergic reactions like skin rash, itching or hives, swelling of the face, lips, or tongue -breast tissue changes or discharge -breathing problems -changes in vaginal bleeding during your period or between your periods -changes in vision -depression -numbness or pain in the arm or leg -pain at site where injected -pain, swelling, warmth in the leg -problems with balance, talking, walking -sudden severe headache -unusually weak or tired -yellowing of the eyes or skin Side effects that usually do not require medical attention (report to your doctor or health care professional if they continue or are bothersome): -changes in emotions or moods -fluid retention and swelling -hair loss -increased in appetite -nausea -trouble sleeping This list may not describe all possible side effects. Call your doctor for  medical advice about side effects. You may report side effects to FDA at 1-800-FDA-1088. Where should I keep my medicine? Keep out of the reach of children. If you are using  this medicine at home, you will be instructed on how to store this medicine. Throw away any unused medicine after the expiration date on the label. NOTE: This sheet is a summary. It may not cover all possible information. If you have questions about this medicine, talk to your doctor, pharmacist, or health care provider.  2018 Elsevier/Gold Standard (2008-02-11 17:05:08) Second Trimester of Pregnancy The second trimester is from week 13 through week 28, month 4 through 6. This is often the time in pregnancy that you feel your best. Often times, morning sickness has lessened or quit. You may have more energy, and you may get hungry more often. Your unborn baby (fetus) is growing rapidly. At the end of the sixth month, he or she is about 9 inches long and weighs about 1 pounds. You will likely feel the baby move (quickening) between 18 and 20 weeks of pregnancy. Follow these instructions at home:  Avoid all smoking, herbs, and alcohol. Avoid drugs not approved by your doctor.  Do not use any tobacco products, including cigarettes, chewing tobacco, and electronic cigarettes. If you need help quitting, ask your doctor. You may get counseling or other support to help you quit.  Only take medicine as told by your doctor. Some medicines are safe and some are not during pregnancy.  Exercise only as told by your doctor. Stop exercising if you start having cramps.  Eat regular, healthy meals.  Wear a good support bra if your breasts are tender.  Do not use hot tubs, steam rooms, or saunas.  Wear your seat belt when driving.  Avoid raw meat, uncooked cheese, and liter boxes and soil used by cats.  Take your prenatal vitamins.  Take 1500-2000 milligrams of calcium daily starting at the 20th week of pregnancy until you deliver your baby.  Try taking medicine that helps you poop (stool softener) as needed, and if your doctor approves. Eat more fiber by eating fresh fruit, vegetables, and whole  grains. Drink enough fluids to keep your pee (urine) clear or pale yellow.  Take warm water baths (sitz baths) to soothe pain or discomfort caused by hemorrhoids. Use hemorrhoid cream if your doctor approves.  If you have puffy, bulging veins (varicose veins), wear support hose. Raise (elevate) your feet for 15 minutes, 3-4 times a day. Limit salt in your diet.  Avoid heavy lifting, wear low heals, and sit up straight.  Rest with your legs raised if you have leg cramps or low back pain.  Visit your dentist if you have not gone during your pregnancy. Use a soft toothbrush to brush your teeth. Be gentle when you floss.  You can have sex (intercourse) unless your doctor tells you not to.  Go to your doctor visits. Get help if:  You feel dizzy.  You have mild cramps or pressure in your lower belly (abdomen).  You have a nagging pain in your belly area.  You continue to feel sick to your stomach (nauseous), throw up (vomit), or have watery poop (diarrhea).  You have bad smelling fluid coming from your vagina.  You have pain with peeing (urination). Get help right away if:  You have a fever.  You are leaking fluid from your vagina.  You have spotting or bleeding from your vagina.  You have severe belly cramping  or pain.  You lose or gain weight rapidly.  You have trouble catching your breath and have chest pain.  You notice sudden or extreme puffiness (swelling) of your face, hands, ankles, feet, or legs.  You have not felt the baby move in over an hour.  You have severe headaches that do not go away with medicine.  You have vision changes. This information is not intended to replace advice given to you by your health care provider. Make sure you discuss any questions you have with your health care provider. Document Released: 10/05/2009 Document Revised: 12/17/2015 Document Reviewed: 09/11/2012 Elsevier Interactive Patient Education  2017 [employer]

## 2018-01-29 ENCOUNTER — Telehealth: Payer: Self-pay

## 2018-01-29 DIAGNOSIS — O0990 Supervision of high risk pregnancy, unspecified, unspecified trimester: Secondary | ICD-10-CM

## 2018-01-29 MED ORDER — DOXYLAMINE SUCCINATE (SLEEP) 25 MG PO TABS: 25.0000 mg | ORAL_TABLET | Freq: Every evening | ORAL | 0 refills | Status: DC | PRN

## 2018-01-29 NOTE — Telephone Encounter (Signed)
PT states Robin [name] sent Unisom to her pharmacy on Friday but the pharmacy is saying that they didn't receive it. Pt is requesting me change prescription to a different pharmacy. I have changed the pharmacy and sent prescription in.

## 2018-02-02 ENCOUNTER — Ambulatory Visit (INDEPENDENT_AMBULATORY_CARE_PROVIDER_SITE_OTHER): Payer: 59

## 2018-02-02 DIAGNOSIS — O0992 Supervision of high risk pregnancy, unspecified, second trimester: Secondary | ICD-10-CM

## 2018-02-02 DIAGNOSIS — O09212 Supervision of pregnancy with history of pre-term labor, second trimester: Secondary | ICD-10-CM | POA: Diagnosis not present

## 2018-02-02 MED ORDER — HYDROXYPROGESTERONE CAPROATE 250 MG/ML IM OIL
250.0000 mg | TOPICAL_OIL | Freq: Once | INTRAMUSCULAR | Status: AC
  Administered 2018-02-02: 250 mg via INTRAMUSCULAR

## 2018-02-09 ENCOUNTER — Ambulatory Visit (INDEPENDENT_AMBULATORY_CARE_PROVIDER_SITE_OTHER): Payer: 59 | Admitting: *Deleted

## 2018-02-09 DIAGNOSIS — O09212 Supervision of pregnancy with history of pre-term labor, second trimester: Secondary | ICD-10-CM | POA: Diagnosis not present

## 2018-02-09 DIAGNOSIS — Z8751 Personal history of pre-term labor: Secondary | ICD-10-CM

## 2018-02-09 MED ORDER — HYDROXYPROGESTERONE CAPROATE 275 MG/1.1ML ~~LOC~~ SOAJ
275.0000 mg | SUBCUTANEOUS | Status: DC
  Administered 2018-02-09 – 2018-02-23 (×3): 275 mg via SUBCUTANEOUS

## 2018-02-13 ENCOUNTER — Ambulatory Visit (HOSPITAL_COMMUNITY): Payer: 59

## 2018-02-16 ENCOUNTER — Ambulatory Visit (INDEPENDENT_AMBULATORY_CARE_PROVIDER_SITE_OTHER): Payer: 59 | Admitting: *Deleted

## 2018-02-16 DIAGNOSIS — O09212 Supervision of pregnancy with history of pre-term labor, second trimester: Secondary | ICD-10-CM

## 2018-02-16 DIAGNOSIS — Z8751 Personal history of pre-term labor: Secondary | ICD-10-CM

## 2018-02-20 ENCOUNTER — Other Ambulatory Visit: Payer: Self-pay | Admitting: Advanced Practice Midwife

## 2018-02-20 ENCOUNTER — Ambulatory Visit (HOSPITAL_COMMUNITY)
Admission: RE | Admit: 2018-02-20 | Discharge: 2018-02-20 | Disposition: A | Discharge status: Home / Self Care | Payer: 59 | Source: Ambulatory Visit | Attending: Advanced Practice Midwife | Admitting: Advanced Practice Midwife

## 2018-02-20 ENCOUNTER — Encounter (HOSPITAL_COMMUNITY): Payer: Self-pay

## 2018-02-20 DIAGNOSIS — O09212 Supervision of pregnancy with history of pre-term labor, second trimester: Secondary | ICD-10-CM

## 2018-02-20 DIAGNOSIS — O0992 Supervision of high risk pregnancy, unspecified, second trimester: Secondary | ICD-10-CM | POA: Insufficient documentation

## 2018-02-20 DIAGNOSIS — Z3686 Encounter for antenatal screening for cervical length: Secondary | ICD-10-CM

## 2018-02-20 DIAGNOSIS — O34219 Maternal care for unspecified type scar from previous cesarean delivery: Secondary | ICD-10-CM | POA: Diagnosis not present

## 2018-02-20 DIAGNOSIS — Z3A19 19 weeks gestation of pregnancy: Secondary | ICD-10-CM | POA: Diagnosis not present

## 2018-02-20 DIAGNOSIS — Z363 Encounter for antenatal screening for malformations: Secondary | ICD-10-CM | POA: Insufficient documentation

## 2018-02-20 DIAGNOSIS — O09219 Supervision of pregnancy with history of pre-term labor, unspecified trimester: Secondary | ICD-10-CM | POA: Diagnosis not present

## 2018-02-22 NOTE — Progress Notes (Signed)
   PRENATAL VISIT NOTE  Subjective:  Robin [name] is a 32 y.o. [id number] at 644w1d being seen today for ongoing prenatal care.  She is currently monitored for the following issues for this high-risk pregnancy and has Family hx of bicuspid aortic valve--FOB; Family history of congenital heart disease; History of preterm delivery; Supervision of high risk pregnancy, antepartum; Previous cesarean section; Family history of congenital anomaly; Hereditary disease in family possibly affecting fetus, affecting management of mother, antepartum condition or complication, not applicable or unspecified fetus; and Irregular periods on their problem list.  Patient reports no complaints.  Contractions: Not present. Vag. Bleeding: None.  Movement: Present. Denies leaking of fluid.   The following portions of the patient's history were reviewed and updated as appropriate: allergies, current medications, past family history, past medical history, past social history, past surgical history and problem list. Problem list updated.  Objective:   Vitals:   02/23/18 0923  BP: 98/60  Pulse: 99  Weight: 116 lb (52.6 kg)    Fetal Status: Fetal Heart Rate (bpm): 151   Movement: Present     General:  Alert, oriented and cooperative. Patient is in no acute distress.  Skin: Skin is warm and dry. No rash noted.   Cardiovascular: Normal heart rate noted  Respiratory: Normal respiratory effort, no problems with respiration noted  Abdomen: Soft, gravid, appropriate for gestational age.  Pain/Pressure: Absent     Pelvic: Cervical exam deferred        Extremities: Normal range of motion.  Edema: None  Mental Status: Normal mood and affect. Normal behavior. Normal judgment and thought content.   Assessment and Plan:  Pregnancy: [id number] at 4644w1d  1. History of preterm delivery - Continue 17-P - Nml CL on 19 weeks US. No further CL's per MFM  2. Previous cesarean section - TOLAC consent signed  3. Supervision of  high risk pregnancy, antepartum   4. Family history of congenital anomaly - Discussed w/ MFM - Nml Anatomy US - Declined genetic screening, Amnio  5. Family history of congenital heart disease - Discussed w/ MFM - Declined Echo  Preterm labor symptoms and general obstetric precautions including but not limited to vaginal bleeding, contractions, leaking of fluid and fetal movement were reviewed in detail with the patient. Please refer to After Visit Summary for other counseling recommendations.  No follow-ups on file.  Future Appointments  Date Time Provider Department Center  03/23/2018 10:30 AM RositaSmith, [place], CNM [facility]    [place] Noelly Lasseigne, [place]

## 2018-02-23 ENCOUNTER — Ambulatory Visit (INDEPENDENT_AMBULATORY_CARE_PROVIDER_SITE_OTHER): Payer: 59 | Admitting: Advanced Practice Midwife

## 2018-02-23 VITALS — BP 98/60 | HR 99 | Wt 116.0 lb

## 2018-02-23 DIAGNOSIS — O0990 Supervision of high risk pregnancy, unspecified, unspecified trimester: Secondary | ICD-10-CM

## 2018-02-23 DIAGNOSIS — O0992 Supervision of high risk pregnancy, unspecified, second trimester: Secondary | ICD-10-CM

## 2018-02-23 DIAGNOSIS — Z8751 Personal history of pre-term labor: Secondary | ICD-10-CM

## 2018-02-23 DIAGNOSIS — Z8279 Family history of other congenital malformations, deformations and chromosomal abnormalities: Secondary | ICD-10-CM

## 2018-02-23 DIAGNOSIS — O34219 Maternal care for unspecified type scar from previous cesarean delivery: Secondary | ICD-10-CM

## 2018-02-23 DIAGNOSIS — O09212 Supervision of pregnancy with history of pre-term labor, second trimester: Secondary | ICD-10-CM | POA: Diagnosis not present

## 2018-02-23 DIAGNOSIS — Z98891 History of uterine scar from previous surgery: Secondary | ICD-10-CM

## 2018-03-02 ENCOUNTER — Ambulatory Visit (INDEPENDENT_AMBULATORY_CARE_PROVIDER_SITE_OTHER): Payer: 59

## 2018-03-02 DIAGNOSIS — O09212 Supervision of pregnancy with history of pre-term labor, second trimester: Secondary | ICD-10-CM | POA: Diagnosis not present

## 2018-03-02 DIAGNOSIS — Z8751 Personal history of pre-term labor: Secondary | ICD-10-CM

## 2018-03-02 MED ORDER — HYDROXYPROGESTERONE CAPROATE 275 MG/1.1ML ~~LOC~~ SOAJ
275.0000 mg | Freq: Once | SUBCUTANEOUS | Status: AC
  Administered 2018-03-02: 275 mg via INTRAMUSCULAR

## 2018-03-09 ENCOUNTER — Ambulatory Visit (INDEPENDENT_AMBULATORY_CARE_PROVIDER_SITE_OTHER): Payer: 59 | Admitting: *Deleted

## 2018-03-09 DIAGNOSIS — O09212 Supervision of pregnancy with history of pre-term labor, second trimester: Secondary | ICD-10-CM | POA: Diagnosis not present

## 2018-03-09 DIAGNOSIS — Z8751 Personal history of pre-term labor: Secondary | ICD-10-CM

## 2018-03-09 MED ORDER — HYDROXYPROGESTERONE CAPROATE 250 MG/ML IM OIL
250.0000 mg | TOPICAL_OIL | INTRAMUSCULAR | Status: DC
  Administered 2018-03-09 – 2018-06-08 (×12): 250 mg via INTRAMUSCULAR

## 2018-03-13 ENCOUNTER — Telehealth: Payer: Self-pay

## 2018-03-13 DIAGNOSIS — B379 Candidiasis, unspecified: Secondary | ICD-10-CM

## 2018-03-13 MED ORDER — TERCONAZOLE 0.4 % VA CREA
1.0000 | TOPICAL_CREAM | Freq: Every day | VAGINAL | 0 refills | Status: DC
Start: 2018-03-13 — End: 2018-04-02

## 2018-03-13 NOTE — Telephone Encounter (Signed)
Pt called stating she had a yeast infection and OTC meds don't work for her. Mariel AloeLora Clark, RN said to send in Terconazole per protocol.

## 2018-03-16 ENCOUNTER — Ambulatory Visit (INDEPENDENT_AMBULATORY_CARE_PROVIDER_SITE_OTHER): Payer: 59

## 2018-03-16 DIAGNOSIS — Z8751 Personal history of pre-term labor: Secondary | ICD-10-CM

## 2018-03-16 DIAGNOSIS — O09212 Supervision of pregnancy with history of pre-term labor, second trimester: Secondary | ICD-10-CM

## 2018-03-16 MED ORDER — HYDROXYPROGESTERONE CAPROATE 250 MG/ML IM OIL
250.0000 mg | TOPICAL_OIL | Freq: Once | INTRAMUSCULAR | Status: AC
  Administered 2018-03-16: 250 mg via INTRAMUSCULAR

## 2018-03-23 ENCOUNTER — Ambulatory Visit (INDEPENDENT_AMBULATORY_CARE_PROVIDER_SITE_OTHER): Payer: 59 | Admitting: *Deleted

## 2018-03-23 ENCOUNTER — Encounter: Payer: Self-pay | Admitting: Advanced Practice Midwife

## 2018-03-23 DIAGNOSIS — O09212 Supervision of pregnancy with history of pre-term labor, second trimester: Secondary | ICD-10-CM | POA: Diagnosis not present

## 2018-03-23 DIAGNOSIS — Z8751 Personal history of pre-term labor: Secondary | ICD-10-CM

## 2018-03-30 ENCOUNTER — Ambulatory Visit (INDEPENDENT_AMBULATORY_CARE_PROVIDER_SITE_OTHER): Payer: 59 | Admitting: *Deleted

## 2018-03-30 DIAGNOSIS — Z8751 Personal history of pre-term labor: Secondary | ICD-10-CM

## 2018-03-30 DIAGNOSIS — O09212 Supervision of pregnancy with history of pre-term labor, second trimester: Secondary | ICD-10-CM

## 2018-04-02 ENCOUNTER — Ambulatory Visit (INDEPENDENT_AMBULATORY_CARE_PROVIDER_SITE_OTHER): Payer: 59 | Admitting: Certified Nurse Midwife

## 2018-04-02 VITALS — BP 101/62 | HR 88 | Wt 123.0 lb

## 2018-04-02 DIAGNOSIS — F32A Depression, unspecified: Secondary | ICD-10-CM

## 2018-04-02 DIAGNOSIS — O99340 Other mental disorders complicating pregnancy, unspecified trimester: Secondary | ICD-10-CM | POA: Insufficient documentation

## 2018-04-02 DIAGNOSIS — Z98891 History of uterine scar from previous surgery: Secondary | ICD-10-CM

## 2018-04-02 DIAGNOSIS — O0990 Supervision of high risk pregnancy, unspecified, unspecified trimester: Secondary | ICD-10-CM

## 2018-04-02 DIAGNOSIS — O09212 Supervision of pregnancy with history of pre-term labor, second trimester: Secondary | ICD-10-CM

## 2018-04-02 DIAGNOSIS — O0992 Supervision of high risk pregnancy, unspecified, second trimester: Secondary | ICD-10-CM

## 2018-04-02 DIAGNOSIS — O99342 Other mental disorders complicating pregnancy, second trimester: Secondary | ICD-10-CM

## 2018-04-02 DIAGNOSIS — F419 Anxiety disorder, unspecified: Secondary | ICD-10-CM

## 2018-04-02 DIAGNOSIS — Z8751 Personal history of pre-term labor: Secondary | ICD-10-CM

## 2018-04-02 DIAGNOSIS — F329 Major depressive disorder, single episode, unspecified: Secondary | ICD-10-CM

## 2018-04-02 MED ORDER — SERTRALINE HCL 25 MG PO TABS: 25.0000 mg | ORAL_TABLET | Freq: Every day | ORAL | 1 refills | Status: DC

## 2018-04-02 NOTE — Patient Instructions (Addendum)
Psychiatric Services [facility]  [address] [place], [place] [phone]  [facility] [address] [place], [place] [phone] or [phone] [email or web address]  [facility], 8:30-5:00 [address] [place], [place] [phone] [email or web address]  *Bring your own interpreter at 1st visit  [facility] [address], [place], [place] [phone] [email or web address]   [facility]  [address], [place], [place] [phone]  [facility] Mon-Fri, 8am-3pm [address], [place], [place] [phone] [email or web address]  [facility] [address] [place], [place], [place] [phone]  [facility] [address], [place], [place] [phone]  [facility] [place] [address] [place], [place] [phone] *pacientes que hablen espanol, favor comunicarse con el Sr. [place], extension 2244 o la [address], extension [place] para hacer una cita.   [facility] [address] "[place]" [phone] (Habla Espanol)  [facility] [address] [place], [place], [place] [phone]  [facility] [address], [place], [place] [phone]   [facility]: [facility] All Survivors)  [address], [address], [place], [place] [phone] [email or web address]  *Uninsured and underinsured, ages 19-64  [facility] [address] [place], [place], [place] [phone] (Habla Espanol)  [facility] [address], [place], [place] [phone] (Habla Espanol)  [facility] [address] [place], [place] [phone] Clarice Pole)  [facility] Mon-Thurs 8:30am-8:00pm/ Fri 8:30-7:00pm [address] [place], [place] (3rd floor, located at corner of [employer] and [employer]) Call [phone] to schedule an appointment [email or web address]  [facility] [address] [place], [place]  [phone]  [facility]  [address], [place], [place] [phone]  [facility] Support [facility]AG ([facility] [place]) [phone] or www.mhag.org [address], [place], [place] [address] * Recovery support and educational programs, including recovery skills classes, support groups, and one-on-one sessions with [place] Certified Peer Support Specialists.    [facility]) [place] NAMI Helpline: (361)873-0151 * Family and Friends Support Group/  Contact Philomena Doheny at [phone] for more information * Family to [employer] and Basics Class : enroll online or email Darreld Mclean at [email or web address]  * Monthly educational meetings, contact Dwain Sarna at [phone] [email or web address]    24- Hour Availability:  [facility] [phone] or [phone]  * Family Service of the [employer] (Domestic Violence, Rape, Victim Assistance) Line (684) 363-1709  Vesta Mixer [phone] or [phone]  * [facility] [place] [facility]  [phone] only(208)649-4719 hours)  *[facility] Mobile Crisis Unit (847)878-2462  *[place] National Suicide Hotline 808-582-2302    Glucose Tolerance Test During Pregnancy The glucose tolerance test (GTT) is a blood test used to determine if you have developed a type of diabetes during pregnancy (gestational diabetes). This is when your body does not properly process sugar (glucose) in the food you eat, resulting in high blood glucose levels. Typically, a GTT is done after you have had  a 1-hour glucose test with results that indicate you possibly have gestational diabetes. It may also be done if:  You have a history of giving birth to very large babies or have experienced repeated fetal loss (stillbirth).  You have signs and symptoms of diabetes, such as: ? Changes in your vision. ? Tingling or numbness in your hands or feet. ? Changes in hunger, thirst, and  urination not otherwise explained by your pregnancy.  The GTT lasts about 3 hours. You will be given a sugar-water solution to drink at the beginning of the test. You will have blood drawn before you drink the solution and then again 1, 2, and 3 hours after you drink it. You will not be allowed to eat or drink anything else during the test. You must remain at the testing location to make sure that your blood is drawn on time. You should also avoid exercising during the test, because exercise can alter test results. How do I prepare for this test? Eat normally for 3 days prior to the GTT test, including having plenty of carbohydrate-rich foods. Do not eat or drink anything except water during the final 12 hours before the test. In addition, your health care provider may ask you to stop taking certain medicines before the test. What do the results mean? It is your responsibility to obtain your test results. Ask the lab or department performing the test when and how you will get your results. Contact your health care provider to discuss any questions you have about your results. Range of Normal Values Ranges for normal values may vary among different labs and hospitals. You should always check with your health care provider after having lab work or other tests done to discuss whether your values are considered within normal limits. Normal levels of blood glucose are as follows:  Fasting: less than 105 mg/dL.  1 hour after drinking the solution: less than 190 mg/dL.  2 hours after drinking the solution: less than 165  mg/dL.  3 hours after drinking the solution: less than 145 mg/dL.  Some substances can interfere with GTT results. These may include:  Blood pressure and heart failure medicines, including beta blockers, furosemide, and thiazides.  Anti-inflammatory medicines, including aspirin.  Nicotine.  Some psychiatric medicines.  Meaning of Results Outside Normal Value Ranges GTT test results that are above normal values may indicate a number of health problems, such as:  Gestational diabetes.  Acute stress response.  Cushing syndrome.  Tumors such as pheochromocytoma or glucagonoma.  Long-term kidney problems.  Pancreatitis.  Hyperthyroidism.  Current infection.  Discuss your test results with your health care provider. He or she will use the results to make a diagnosis and determine a treatment plan that is right for you. This information is not intended to replace advice given to you by your health care provider. Make sure you discuss any questions you have with your health care provider. Document Released: 01/10/2012 Document Revised: 12/17/2015 Document Reviewed: 11/15/2013 Elsevier Interactive Patient Education  [employer].

## 2018-04-02 NOTE — Progress Notes (Signed)
Subjective:  Robin [name] is a 32 y.o. [id number] at [redacted]w[redacted]d being seen today for ongoing prenatal care.  She is currently monitored for the following issues for this high-risk pregnancy and has Family hx of bicuspid aortic valve--FOB; Family history of congenital heart disease; History of preterm delivery; Supervision of high risk pregnancy, antepartum; Previous cesarean section; Family history of congenital anomaly; Hereditary disease in family possibly affecting fetus, affecting management of mother, antepartum condition or complication, not applicable or unspecified fetus; and Irregular periods on their problem list.  Patient reports recent feelings of depression and frequent nightmares. 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 Reports losing temper at friends, spouse, and kids. No SI/HI. She thinks it might be due to getting closer in [place] to when she lost her son last year (28 wk abruption). Requesting to restart SSRI. Contractions: Not present. Vag. Bleeding: None.  Movement: Present. Denies leaking of fluid.   The following portions of the patient's history were reviewed and updated as appropriate: allergies, current medications, past family history, past medical history, past social history, past surgical history and problem list. Problem list updated.  Objective:   Vitals:   04/02/18 0916  BP: 101/62  Pulse: 88  Weight: 55.8 kg    Fetal Status: Fetal Heart Rate (bpm): 145 Fundal Height: 27 cm Movement: Present     General:  Alert, oriented and cooperative. Patient is in no acute distress.  Skin: Skin is warm and dry.  Cardiovascular: Normal heart rate noted  Respiratory: Normal respiratory effort and rate  Abdomen: Soft, appropriate for gestational age. Pain/Pressure: Absent     Pelvic: Vag. Bleeding: None Vag D/C Character: Thin   Cervical exam deferred        Extremities: Normal range of motion.  Edema: None  Mental Status: Normal mood and  affect. Normal behavior. Normal judgment and thought content.   Urinalysis:      Assessment and Plan:  Pregnancy: [id number] at [redacted]w[redacted]d  1. History of preterm delivery -continue 17-OHP  2. Supervision of high risk pregnancy, antepartum  3. Previous cesarean section -planning TOLAC, consent signed  4. Depression and anxiety -start Zoloft -recommend restart psychotherapy-list provided  Preterm labor symptoms and general obstetric precautions including but not limited to vaginal bleeding, contractions, leaking of fluid and fetal movement were reviewed in detail with the patient. Please refer to After Visit Summary for other counseling recommendations.  Return in about 3 weeks (around 04/23/2018).   Donette Larry, CNM

## 2018-04-06 ENCOUNTER — Ambulatory Visit (INDEPENDENT_AMBULATORY_CARE_PROVIDER_SITE_OTHER): Payer: 59

## 2018-04-06 DIAGNOSIS — Z8751 Personal history of pre-term labor: Secondary | ICD-10-CM

## 2018-04-06 DIAGNOSIS — O09212 Supervision of pregnancy with history of pre-term labor, second trimester: Secondary | ICD-10-CM

## 2018-04-06 MED ORDER — HYDROXYPROGESTERONE CAPROATE 250 MG/ML IM OIL
250.0000 mg | TOPICAL_OIL | Freq: Once | INTRAMUSCULAR | Status: AC
  Administered 2018-04-06: 250 mg via INTRAMUSCULAR

## 2018-04-13 ENCOUNTER — Ambulatory Visit (INDEPENDENT_AMBULATORY_CARE_PROVIDER_SITE_OTHER): Payer: 59

## 2018-04-13 VITALS — BP 110/69 | HR 83 | Wt 122.0 lb

## 2018-04-13 DIAGNOSIS — O0990 Supervision of high risk pregnancy, unspecified, unspecified trimester: Secondary | ICD-10-CM

## 2018-04-13 DIAGNOSIS — O09212 Supervision of pregnancy with history of pre-term labor, second trimester: Secondary | ICD-10-CM

## 2018-04-13 NOTE — Progress Notes (Signed)
Chart reviewed for nurse visit. Agree with plan of care.   Rolm Bookbindereill, Donnesha Karg M, [place] 04/13/2018 9:30 AM

## 2018-04-13 NOTE — Progress Notes (Signed)
Pt is in office for Makena injection.  Pt tolerated injection well. Pt has return visits scheduled.  Pt has no other concerns today.  BP 110/69   Pulse 83   Wt 122 lb (55.3 kg)   LMP 10/04/2017   BMI 19.69 kg/m   Administrations This Visit    hydroxyprogesterone caproate (MAKENA) 250 mg/mL injection 250 mg    Admin Date 04/13/2018 Action Given Dose 250 mg Route Intramuscular Administered By Lanney GinsFoster, Briane Birden D, CMA

## 2018-04-20 ENCOUNTER — Ambulatory Visit (INDEPENDENT_AMBULATORY_CARE_PROVIDER_SITE_OTHER): Payer: 59

## 2018-04-20 DIAGNOSIS — O09213 Supervision of pregnancy with history of pre-term labor, third trimester: Secondary | ICD-10-CM

## 2018-04-20 DIAGNOSIS — Z8751 Personal history of pre-term labor: Secondary | ICD-10-CM

## 2018-04-20 MED ORDER — HYDROXYPROGESTERONE CAPROATE 250 MG/ML IM OIL
250.0000 mg | TOPICAL_OIL | Freq: Once | INTRAMUSCULAR | Status: AC
  Administered 2018-04-20: 250 mg via INTRAMUSCULAR

## 2018-04-20 NOTE — Progress Notes (Signed)
Pt here for Makena. Shot given. Pt tolerated well.

## 2018-04-24 ENCOUNTER — Encounter: Payer: Self-pay | Admitting: Certified Nurse Midwife

## 2018-04-27 ENCOUNTER — Ambulatory Visit (INDEPENDENT_AMBULATORY_CARE_PROVIDER_SITE_OTHER): Payer: 59 | Admitting: Advanced Practice Midwife

## 2018-04-27 VITALS — BP 104/62 | HR 99 | Wt 123.0 lb

## 2018-04-27 DIAGNOSIS — O09299 Supervision of pregnancy with other poor reproductive or obstetric history, unspecified trimester: Secondary | ICD-10-CM | POA: Insufficient documentation

## 2018-04-27 DIAGNOSIS — O09213 Supervision of pregnancy with history of pre-term labor, third trimester: Secondary | ICD-10-CM

## 2018-04-27 DIAGNOSIS — O0990 Supervision of high risk pregnancy, unspecified, unspecified trimester: Secondary | ICD-10-CM

## 2018-04-27 DIAGNOSIS — Z8751 Personal history of pre-term labor: Secondary | ICD-10-CM

## 2018-04-27 DIAGNOSIS — Z98891 History of uterine scar from previous surgery: Secondary | ICD-10-CM

## 2018-04-27 DIAGNOSIS — O09293 Supervision of pregnancy with other poor reproductive or obstetric history, third trimester: Secondary | ICD-10-CM

## 2018-04-27 DIAGNOSIS — O0993 Supervision of high risk pregnancy, unspecified, third trimester: Secondary | ICD-10-CM

## 2018-04-27 NOTE — Progress Notes (Signed)
   PRENATAL VISIT NOTE  Subjective:  Robin [name] is a 32 y.o. [id number] at [redacted]w[redacted]d being seen today for ongoing prenatal care.  She is currently monitored for the following issues for this high-risk pregnancy and has Family hx of bicuspid aortic valve--FOB; Family history of congenital heart disease; History of preterm delivery; Supervision of high risk pregnancy, antepartum; Previous cesarean section; Family history of congenital anomaly; Hereditary disease in family possibly affecting fetus, affecting management of mother, antepartum condition or complication, not applicable or unspecified fetus; Irregular periods; and Depression affecting pregnancy on their problem list.  Patient reports no complaints.  Contractions: Not present. Vag. Bleeding: None.  Movement: Present. Denies leaking of fluid.   The following portions of the patient's history were reviewed and updated as appropriate: allergies, current medications, past family history, past medical history, past social history, past surgical history and problem list. Problem list updated.  Objective:   Vitals:   04/27/18 0847  BP: 104/62  Pulse: 99  Weight: 55.8 kg    Fetal Status: Fetal Heart Rate (bpm): 140   Movement: Present     General:  Alert, oriented and cooperative. Patient is in no acute distress.  Skin: Skin is warm and dry. No rash noted.   Cardiovascular: Normal heart rate noted  Respiratory: Normal respiratory effort, no problems with respiration noted  Abdomen: Soft, gravid, appropriate for gestational age.  Pain/Pressure: Absent     Pelvic: Cervical exam deferred        Extremities: Normal range of motion.  Edema: None  Mental Status: Normal mood and affect. Normal behavior. Normal judgment and thought content.   Assessment and Plan:  Pregnancy: [id number] at [redacted]w[redacted]d  1. Supervision of high risk pregnancy, antepartum --Anticipatory guidance about next visits/weeks of pregnancy given. - 2Hr GTT w/ 1 Hr Carpenter 75  g - HIV antibody (with reflex) - CBC - RPR  2. Previous cesarean section --x1 related to abruption, desires TOLAC. Consent in chart.  3. History preterm delivery --PPROM @ 18 weeks.  Delivered by C/S @ 28 weeks r/t abruption, neonatal demise at 1 day of life.  Preterm labor symptoms and general obstetric precautions including but not limited to vaginal bleeding, contractions, leaking of fluid and fetal movement were reviewed in detail with the patient. Please refer to After Visit Summary for other counseling recommendations.  No follow-ups on file.  Future Appointments  Date Time Provider Department Center  05/04/2018  9:00 AM [facility] NURSE [facility]  05/11/2018 10:00 AM Donette Larry, CNM [facility]    Sharen Counter, CNM

## 2018-04-30 LAB — CBC
HEMATOCRIT: 36 % (ref 35.0–45.0)
Hemoglobin: 11.6 g/dL — ABNORMAL LOW (ref 11.7–15.5)
MCH: 29.9 pg (ref 27.0–33.0)
MCHC: 32.2 g/dL (ref 32.0–36.0)
MCV: 92.8 fL (ref 80.0–100.0)
MPV: 10 fL (ref 7.5–12.5)
Platelets: 264 10*3/uL (ref 140–400)
RBC: 3.88 10*6/uL (ref 3.80–5.10)
RDW: 11.4 % (ref 11.0–15.0)
WBC: 8.3 10*3/uL (ref 3.8–10.8)

## 2018-04-30 LAB — RPR: RPR: NONREACTIVE

## 2018-04-30 LAB — HIV ANTIBODY (ROUTINE TESTING W REFLEX): HIV: NONREACTIVE

## 2018-04-30 LAB — 2HR GTT W 1 HR, CARPENTER, 75 G
Glucose, 1 Hr, Gest: 89 mg/dL (ref 65–179)
Glucose, 2 Hr, Gest: 110 mg/dL (ref 65–152)
Glucose, Fasting, Gest: 72 mg/dL (ref 65–91)

## 2018-05-04 ENCOUNTER — Ambulatory Visit (INDEPENDENT_AMBULATORY_CARE_PROVIDER_SITE_OTHER): Payer: 59 | Admitting: *Deleted

## 2018-05-04 DIAGNOSIS — O09213 Supervision of pregnancy with history of pre-term labor, third trimester: Secondary | ICD-10-CM

## 2018-05-04 DIAGNOSIS — Z8751 Personal history of pre-term labor: Secondary | ICD-10-CM

## 2018-05-11 ENCOUNTER — Ambulatory Visit (INDEPENDENT_AMBULATORY_CARE_PROVIDER_SITE_OTHER): Payer: 59 | Admitting: Certified Nurse Midwife

## 2018-05-11 VITALS — BP 104/63 | HR 102 | Wt 126.0 lb

## 2018-05-11 DIAGNOSIS — O99340 Other mental disorders complicating pregnancy, unspecified trimester: Secondary | ICD-10-CM

## 2018-05-11 DIAGNOSIS — O99343 Other mental disorders complicating pregnancy, third trimester: Secondary | ICD-10-CM

## 2018-05-11 DIAGNOSIS — O09213 Supervision of pregnancy with history of pre-term labor, third trimester: Secondary | ICD-10-CM

## 2018-05-11 DIAGNOSIS — O0993 Supervision of high risk pregnancy, unspecified, third trimester: Secondary | ICD-10-CM

## 2018-05-11 DIAGNOSIS — F329 Major depressive disorder, single episode, unspecified: Secondary | ICD-10-CM

## 2018-05-11 DIAGNOSIS — Z8751 Personal history of pre-term labor: Secondary | ICD-10-CM

## 2018-05-11 DIAGNOSIS — Z98891 History of uterine scar from previous surgery: Secondary | ICD-10-CM

## 2018-05-11 DIAGNOSIS — O0990 Supervision of high risk pregnancy, unspecified, unspecified trimester: Secondary | ICD-10-CM

## 2018-05-11 NOTE — Progress Notes (Signed)
Subjective:  Robin Robin [name] is a 32 y.o. Robin Robin [name] at [redacted]w[redacted]d being seen today for ongoing prenatal care.  She is currently monitored for the following issues for this high-risk pregnancy and has Family hx of bicuspid aortic valve--FOB; Family history of congenital heart disease; History of preterm delivery; Supervision of high risk pregnancy, antepartum; Previous cesarean section; Family history of congenital anomaly; Hereditary disease in family possibly affecting fetus, affecting management of mother, antepartum condition or complication, not applicable or unspecified fetus; Irregular periods; Depression affecting pregnancy; and Pregnancy with history of neonatal death on their problem list.  Patient reports some improvement in depressive sx but constinues to have episodes of panic attacks and unmotivated feelings.  Contractions: Not present. Vag. Bleeding: None.  Movement: Present. Denies leaking of fluid.   The following portions of the patient's history were reviewed and updated as appropriate: allergies, current medications, past family history, past medical history, past social history, past surgical history and problem list. Problem list updated.  Objective:   Vitals:   05/11/18 1008  BP: 104/63  Pulse: (!) 102  Weight: 57.2 kg    Fetal Status: Fetal Heart Rate (bpm): 138 Fundal Height: 31 cm Movement: Present     General:  Alert, oriented and cooperative. Patient is in Robin [name] acute distress.  Skin: Skin is warm and dry. Robin [name] rash noted.   Cardiovascular: Normal heart rate noted  Respiratory: Normal respiratory effort, Robin [name] problems with respiration noted  Abdomen: Soft, gravid, appropriate for gestational age. Pain/Pressure: Absent     Pelvic: Vag. Bleeding: None Vag D/C Character: Thin   Cervical exam deferred        Extremities: Normal range of motion.  Edema: None  Mental Status: Normal mood and affect. Normal behavior. Normal judgment and thought content.   Urinalysis:      Assessment  and Plan:  Pregnancy: Robin Robin [name] at [redacted]w[redacted]d  1. Supervision of high risk pregnancy, antepartum - discussed importance of Flu vaxine, and consequences of Flu in pregnancy- pt continues to decline  2. Depression affecting pregnancy - will increase Zoloft - has Rx will increase to 50 mg daily  3. Previous cesarean section - planning TOLAC, consent signed  4. History of preterm delivery - continue 17-P  Preterm labor symptoms and general obstetric precautions including but not limited to vaginal bleeding, contractions, leaking of fluid and fetal movement were reviewed in detail with the patient. Please refer to After Visit Summary for other counseling recommendations.  Return in about 2 weeks (around 05/25/2018).   Donette Larry, CNM

## 2018-05-18 ENCOUNTER — Ambulatory Visit (INDEPENDENT_AMBULATORY_CARE_PROVIDER_SITE_OTHER): Payer: 59 | Admitting: *Deleted

## 2018-05-18 DIAGNOSIS — O09213 Supervision of pregnancy with history of pre-term labor, third trimester: Secondary | ICD-10-CM | POA: Diagnosis not present

## 2018-05-18 DIAGNOSIS — Z8751 Personal history of pre-term labor: Secondary | ICD-10-CM

## 2018-05-25 ENCOUNTER — Ambulatory Visit (INDEPENDENT_AMBULATORY_CARE_PROVIDER_SITE_OTHER): Payer: 59 | Admitting: Certified Nurse Midwife

## 2018-05-25 VITALS — BP 101/59 | HR 105 | Wt 128.0 lb

## 2018-05-25 DIAGNOSIS — O0993 Supervision of high risk pregnancy, unspecified, third trimester: Secondary | ICD-10-CM

## 2018-05-25 DIAGNOSIS — O99340 Other mental disorders complicating pregnancy, unspecified trimester: Secondary | ICD-10-CM

## 2018-05-25 DIAGNOSIS — O99343 Other mental disorders complicating pregnancy, third trimester: Secondary | ICD-10-CM

## 2018-05-25 DIAGNOSIS — F32A Depression, unspecified: Secondary | ICD-10-CM

## 2018-05-25 DIAGNOSIS — O09213 Supervision of pregnancy with history of pre-term labor, third trimester: Secondary | ICD-10-CM

## 2018-05-25 DIAGNOSIS — O0990 Supervision of high risk pregnancy, unspecified, unspecified trimester: Secondary | ICD-10-CM

## 2018-05-25 DIAGNOSIS — Z8751 Personal history of pre-term labor: Secondary | ICD-10-CM

## 2018-05-25 DIAGNOSIS — F329 Major depressive disorder, single episode, unspecified: Secondary | ICD-10-CM

## 2018-05-25 NOTE — Progress Notes (Signed)
Subjective:  Robin [name] is a 32 y.o. [id number] at [redacted]w[redacted]d being seen today for ongoing prenatal care.  She is currently monitored for the following issues for this high-risk pregnancy and has Family hx of bicuspid aortic valve--FOB; Family history of congenital heart disease; History of preterm delivery; Supervision of high risk pregnancy, antepartum; Previous cesarean section; Family history of congenital anomaly; Hereditary disease in family possibly affecting fetus, affecting management of mother, antepartum condition or complication, not applicable or unspecified fetus; Irregular periods; Depression affecting pregnancy; and Pregnancy with history of neonatal death on their problem list.  Patient reports no complaints.  Contractions: Not present. Vag. Bleeding: None.  Movement: Present. Denies leaking of fluid.   The following portions of the patient's history were reviewed and updated as appropriate: allergies, current medications, past family history, past medical history, past social history, past surgical history and problem list. Problem list updated.  Objective:   Vitals:   05/25/18 0944  BP: (!) 101/59  Pulse: (!) 105  Weight: 58.1 kg    Fetal Status: Fetal Heart Rate (bpm): 138 Fundal Height: 33 cm Movement: Present  Presentation: Vertex  General:  Alert, oriented and cooperative. Patient is in no acute distress.  Skin: Skin is warm and dry. No rash noted.   Cardiovascular: Normal heart rate noted  Respiratory: Normal respiratory effort, no problems with respiration noted  Abdomen: Soft, gravid, appropriate for gestational age. Pain/Pressure: Absent     Pelvic: Vag. Bleeding: None Vag D/C Character: Thin   Cervical exam deferred        Extremities: Normal range of motion.  Edema: None  Mental Status: Normal mood and affect. Normal behavior. Normal judgment and thought content.   Urinalysis:      Assessment and Plan:  Pregnancy: [id number] at [redacted]w[redacted]d  1. Supervision of high risk  pregnancy, antepartum - reviewed birth plan  2. Depression affecting pregnancy - feeling better with increased dosage of Zoloft  3. Hx of PTD - continue 17-P  Preterm labor symptoms and general obstetric precautions including but not limited to vaginal bleeding, contractions, leaking of fluid and fetal movement were reviewed in detail with the patient. Please refer to After Visit Summary for other counseling recommendations.  Return in about 2 weeks (around 06/08/2018).   Donette Larry, CNM

## 2018-05-28 ENCOUNTER — Telehealth: Payer: Self-pay | Admitting: *Deleted

## 2018-05-28 MED ORDER — SERTRALINE HCL 50 MG PO TABS: 50.0000 mg | ORAL_TABLET | Freq: Every day | ORAL | 6 refills | Status: DC

## 2018-05-28 NOTE — Telephone Encounter (Signed)
Pt called stating she was seen on 11/19 and had been taking her Zoloft 25 mg 2 PO daily.  She forgot to ask for a RF on the med so RX for 50 mg daily was sent to [facility] pharmacy.

## 2018-05-29 ENCOUNTER — Emergency Department (INDEPENDENT_AMBULATORY_CARE_PROVIDER_SITE_OTHER)
Admission: EM | Admit: 2018-05-29 | Discharge: 2018-05-29 | Disposition: A | Discharge status: Home / Self Care | Payer: 59 | Source: Home / Self Care | Attending: Family Medicine | Admitting: Family Medicine

## 2018-05-29 ENCOUNTER — Other Ambulatory Visit: Payer: Self-pay

## 2018-05-29 DIAGNOSIS — J020 Streptococcal pharyngitis: Secondary | ICD-10-CM

## 2018-05-29 DIAGNOSIS — B370 Candidal stomatitis: Secondary | ICD-10-CM | POA: Diagnosis not present

## 2018-05-29 LAB — POCT RAPID STREP A (OFFICE): Rapid Strep A Screen: POSITIVE — AB

## 2018-05-29 MED ORDER — NYSTATIN 100000 UNIT/ML MT SUSP: 5.0000 mL | Freq: Four times a day (QID) | OROMUCOSAL | 0 refills | Status: DC

## 2018-05-29 MED ORDER — FLUCONAZOLE 150 MG PO TABS: ORAL_TABLET | ORAL | 0 refills | Status: DC

## 2018-05-29 MED ORDER — PENICILLIN V POTASSIUM 500 MG PO TABS: ORAL_TABLET | ORAL | 0 refills | Status: DC

## 2018-05-29 NOTE — ED Provider Notes (Signed)
[facility]    CSN: [id number] Arrival date & time: 05/29/18  1546     History   Chief Complaint Chief Complaint  Patient presents with  . Sore Throat    HPI Robin [name] is a 32 y.o. female.   Patient developed mild cold symptoms four days ago with nasal congestion, cough, sneezing, and sore throat. Most symptoms rapidly resolved except for persistent sore throat.  She has had sweats during the past two nights.  She also complains of small white spots on the posterior roof of her mouth that she can scrape off.  She is presently in the third trimester of pregnancy without complication.  The history is provided by the patient.    Past Medical History:  Diagnosis Date  . Abnormal Pap smear 2008   HAD HPV;HAD GENITAL WART REMOVED;LAST PAP 07/2010 WAS NORMAL  . Anemia    FeSO4 SUPP TAKEN IN PAST  . Asthma    AS A CHILD;GREW OUT OF @ 8 YOA  . Depression    after the death of her son, was on meds then  . History of smoking 12/26/2011   Reports quit '12  . Infection    UTI;CAN GET FREQ  . Vaginal Pap smear, abnormal     Patient Active Problem List   Diagnosis Date Noted  . Pregnancy with history of neonatal death 05-05-2018  . Depression affecting pregnancy 04/02/2018  . Irregular periods 01/26/2018  . Hereditary disease in family possibly affecting fetus, affecting management of mother, antepartum condition or complication, not applicable or unspecified fetus   . History of preterm delivery 11/17/2017  . Supervision of high risk pregnancy, antepartum 11/17/2017  . Previous cesarean section 11/17/2017  . Family history of congenital anomaly 11/17/2017  . Family history of congenital heart disease 07/27/2015  . Family hx of bicuspid aortic valve--FOB 04/03/2012    Past Surgical History:  Procedure Laterality Date  . CESAREAN SECTION N/A 09/09/2016   Procedure: CESAREAN SECTION;  Surgeon: Tereso Newcomer, MD;  Location: [facility];  Service:  Obstetrics;  Laterality: N/A;  . NASAL SINUS SURGERY  2016    OB History    Gravida  6   Para  4   Term  3   Preterm  1   AB  1   Living  3     SAB  1   TAB      Ectopic      Multiple      Live Births  4            Home Medications    Prior to Admission medications   Medication Sig Start Date End Date Taking? Authorizing Provider  nystatin (MYCOSTATIN) 100000 UNIT/ML suspension Take 5 mLs (500,000 Units total) by mouth 4 (four) times daily. Swish in mouth then expectorate 05/29/18   Lattie Haw, MD  penicillin v potassium (VEETID) 500 MG tablet Take one tab by mouth twice daily for 10 days 05/29/18   Lattie Haw, MD  Prenatal Vit-Fe Fumarate-FA (PRENATAL VITAMINS) 28-0.8 MG TABS Take 1 tablet by mouth daily.  04/23/16   [provider]  sertraline (ZOLOFT) 50 MG tablet Take 1 tablet (50 mg total) by mouth daily. 05/28/18   Donette Larry, CNM    Family History Family History  Problem Relation Age of Onset  . Mitral valve prolapse Mother        DIED @ 44 YOA OF CONDITION  . Mitral valve prolapse Paternal  Aunt   . Heart attack Paternal Grandfather        DECEASED  . Dementia Maternal Grandmother     Social History Social History   Tobacco Use  . Smoking status: Former Smoker    Types: Cigarettes    Last attempt to quit: 06/27/2011    Years since quitting: 6.9  . Smokeless tobacco: Never Used  Substance Use Topics  . Alcohol use: No    Comment: BI-WEEKLY PRIOR TO PREGNANCY  . Drug use: No     Allergies   Patient has no known allergies.   Review of Systems Review of Systems + sore throat + cough, resolved No pleuritic pain No wheezing + nasal congestion, improved + post-nasal drainage No sinus pain/pressure No itchy/red eyes No earache No hemoptysis No SOB No fever, + chills/sweats No nausea No vomiting No abdominal pain No diarrhea No urinary symptoms No skin rash + fatigue No myalgias No headache     Physical Exam Triage Vital Signs ED Triage Vitals  Enc Vitals Group     BP 05/29/18 1614 114/74     Pulse Rate 05/29/18 1614 89     Resp --      Temp 05/29/18 1614 98.5 F (36.9 C)     Temp Source 05/29/18 1614 Oral     SpO2 05/29/18 1614 98 %     Weight 05/29/18 1615 128 lb (58.1 kg)     Height 05/29/18 1615 5\' 6"  (1.676 m)     Head Circumference --      Peak Flow --      Pain Score 05/29/18 1615 0     Pain Loc --      Pain Edu? --      Excl. in GC? --    No data found.  Updated Vital Signs BP 114/74 (BP Location: Right Arm)   Pulse 89   Temp 98.5 F (36.9 C) (Oral)   Ht 5\' 6"  (1.676 m)   Wt 58.1 kg   LMP 10/04/2017   SpO2 98%   BMI 20.66 kg/m   Visual Acuity Right Eye Distance:   Left Eye Distance:   Bilateral Distance:    Right Eye Near:   Left Eye Near:    Bilateral Near:     Physical Exam  HENT:  Mouth/Throat:    Several small white plaques above uvula, easily removed with cotton applicator.   Nursing notes and Vital Signs reviewed. Appearance:  Patient appears stated age, and in no acute distress Eyes:  Pupils are equal, round, and reactive to light and accomodation.  Extraocular movement is intact.  Conjunctivae are not inflamed  Ears:  Canals normal.  Tympanic membranes normal.  Nose:  Mildly congested turbinates.  No sinus tenderness.   Pharynx:   Mild erythema of uvula.  White plaques above uvula as noted on diagram.  Neck:  Supple.  Enlarged posterior/lateral nodes are palpated bilaterally, tender to palpation on the left.   Lungs:  Clear to auscultation.  Breath sounds are equal.  Moving air well. Heart:  Regular rate and rhythm without murmurs, rubs, or gallops.  Abdomen:  Gravid.     Extremities:  No edema.  Skin:  No rash present.    UC Treatments / Results  Labs (all labs ordered are listed, but only abnormal results are displayed) Labs Reviewed  POCT RAPID STREP A (OFFICE) - Abnormal; Notable for the following components:       Result Value   Rapid Strep A Screen  Positive (*)    All other components within normal limits  POCT KOH prep (oral scraping):  Positive fungal elements.  EKG None  Radiology No results found.  Procedures Procedures (including critical care time)  Medications Ordered in UC Medications - No data to display  Initial Impression / Assessment and Plan / UC Course  I have reviewed the triage vital signs and the nursing notes.  Pertinent labs & imaging results that were available during my care of the patient were reviewed by me and considered in my medical decision making (see chart for details).    Begin PenVK. Rx for nystatin oral suspension. Followup with Family Doctor if not improved in 10 days. Final Clinical Impressions(s) / UC Diagnoses   Final diagnoses:  Strep pharyngitis  Oral candidiasis     Discharge Instructions     Rinse your mouth with a warm salt-water mixture several times a day. To make a salt-water mixture, completely dissolve 1/2-1 tsp of salt in 1 cup of warm water.    ED Prescriptions    Medication Sig Dispense Auth. Provider   penicillin v potassium (VEETID) 500 MG tablet Take one tab by mouth twice daily for 10 days 20 tablet Lattie Haw, MD   fluconazole (DIFLUCAN) 150 MG tablet  (Status: Discontinued) Take one tab by mouth once.  May repeat in 72 hours. 2 tablet Lattie Haw, MD   nystatin (MYCOSTATIN) 100000 UNIT/ML suspension Take 5 mLs (500,000 Units total) by mouth 4 (four) times daily. Swish in mouth then expectorate 200 mL Lattie Haw, MD         Lattie Haw, MD 05/29/18 [phone]

## 2018-05-29 NOTE — Discharge Instructions (Signed)
Rinse your mouth with a warm salt-water mixture several times a day. To make a salt-water mixture, completely dissolve 1/2-1 tsp of salt in 1 cup of warm water. °

## 2018-05-29 NOTE — ED Triage Notes (Signed)
Pt stated that she started with cold sx on Friday, went away, but thenhas had a sore throat since.

## 2018-06-01 ENCOUNTER — Ambulatory Visit (INDEPENDENT_AMBULATORY_CARE_PROVIDER_SITE_OTHER): Payer: 59

## 2018-06-01 DIAGNOSIS — O09213 Supervision of pregnancy with history of pre-term labor, third trimester: Secondary | ICD-10-CM

## 2018-06-01 DIAGNOSIS — Z8751 Personal history of pre-term labor: Secondary | ICD-10-CM

## 2018-06-01 NOTE — Progress Notes (Signed)
Pt here for Makena injection. Injection tolerated well. PT has next injection scheduled.

## 2018-06-08 ENCOUNTER — Ambulatory Visit (INDEPENDENT_AMBULATORY_CARE_PROVIDER_SITE_OTHER): Payer: 59

## 2018-06-08 VITALS — BP 110/70 | HR 120 | Wt 128.0 lb

## 2018-06-08 DIAGNOSIS — O09213 Supervision of pregnancy with history of pre-term labor, third trimester: Secondary | ICD-10-CM

## 2018-06-08 DIAGNOSIS — O0993 Supervision of high risk pregnancy, unspecified, third trimester: Secondary | ICD-10-CM

## 2018-06-08 DIAGNOSIS — F329 Major depressive disorder, single episode, unspecified: Secondary | ICD-10-CM

## 2018-06-08 DIAGNOSIS — Z8751 Personal history of pre-term labor: Secondary | ICD-10-CM

## 2018-06-08 DIAGNOSIS — O99340 Other mental disorders complicating pregnancy, unspecified trimester: Secondary | ICD-10-CM

## 2018-06-08 DIAGNOSIS — O0990 Supervision of high risk pregnancy, unspecified, unspecified trimester: Secondary | ICD-10-CM

## 2018-06-08 DIAGNOSIS — O99343 Other mental disorders complicating pregnancy, third trimester: Secondary | ICD-10-CM

## 2018-06-08 DIAGNOSIS — Z98891 History of uterine scar from previous surgery: Secondary | ICD-10-CM

## 2018-06-08 NOTE — Progress Notes (Addendum)
   PRENATAL VISIT NOTE  Subjective:  Robin [name] is a 32 y.o. [id number] at 6091w2d being seen today for ongoing prenatal care.  She is currently monitored for the following issues for this high-risk pregnancy and has Family hx of bicuspid aortic valve--FOB; Family history of congenital heart disease; History of preterm delivery; Supervision of high risk pregnancy, antepartum; Previous cesarean section; Family history of congenital anomaly; Hereditary disease in family possibly affecting fetus, affecting management of mother, antepartum condition or complication, not applicable or unspecified fetus; Irregular periods; Depression affecting pregnancy; and Pregnancy with history of neonatal death on their problem list.  Patient reports no complaints.  Contractions: Not present. Vag. Bleeding: None.  Movement: Present. Denies leaking of fluid.   The following portions of the patient's history were reviewed and updated as appropriate: allergies, current medications, past family history, past medical history, past social history, past surgical history and problem list. Problem list updated.  Objective:   Vitals:   06/08/18 0939  BP: 110/70  Pulse: (!) 120  Weight: 128 lb (58.1 kg)    Fetal Status: Fetal Heart Rate (bpm): 147 Fundal Height: 35 cm Movement: Present     General:  Alert, oriented and cooperative. Patient is in no acute distress.  Skin: Skin is warm and dry. No rash noted.   Cardiovascular: Normal heart rate noted  Respiratory: Normal respiratory effort, no problems with respiration noted  Abdomen: Soft, gravid, appropriate for gestational age.  Pain/Pressure: Absent     Pelvic: Cervical exam deferred        Extremities: Normal range of motion.  Edema: None  Mental Status: Normal mood and affect. Normal behavior. Normal judgment and thought content.   Assessment and Plan:  Pregnancy: [id number] at 491w2d  1. Supervision of high risk pregnancy, antepartum - No complaints, routine  care - GBS next visit  2. History of preterm labor - Makena today  3. Depression affecting pregnancy - Reports feeling well since increased dose  4. Previous cesarean section - Planning TOLAC  Preterm labor symptoms and general obstetric precautions including but not limited to vaginal bleeding, contractions, leaking of fluid and fetal movement were reviewed in detail with the patient. Please refer to After Visit Summary for other counseling recommendations.  Return in about 1 week (around 06/15/2018) for Return OB visit with GBS.  Future Appointments  Date Time Provider Department Center  06/15/2018 10:30 AM Rolm Bookbindereill,  M, [facility]  06/29/2018  9:30 AM Sharyon Cableogers, Veronica C, [facility]  07/06/2018  9:30 AM Rolm Bookbindereill,  M, CNM [facility]  07/13/2018  9:30 AM Sharyon Cableogers, Veronica C, CNM [facility]    Rolm Bookbinderaroline M , CNM 06/08/18 10:08 AM

## 2018-06-08 NOTE — Patient Instructions (Signed)
Safe Medications in Pregnancy   Acne: Benzoyl Peroxide Salicylic Acid  Backache/Headache: Tylenol: 2 regular strength every 4 hours OR              2 Extra strength every 6 hours  Colds/Coughs/Allergies: Benadryl (alcohol free) 25 mg every 6 hours as needed Breath right strips Claritin Cepacol throat lozenges Chloraseptic throat spray Cold-Eeze- up to three times per day Cough drops, alcohol free Flonase (by prescription only) Guaifenesin Mucinex Robitussin DM (plain only, alcohol free) Saline nasal spray/drops Sudafed (pseudoephedrine) & Actifed ** use only after [redacted] weeks gestation and if you do not have high blood pressure Tylenol Vicks Vaporub Zinc lozenges Zyrtec   Constipation: Colace Ducolax suppositories Fleet enema Glycerin suppositories Metamucil Milk of magnesia Miralax Senokot Smooth move tea  Diarrhea: Kaopectate Imodium A-D  *NO pepto Bismol  Hemorrhoids: Anusol Anusol HC Preparation H Tucks  Indigestion: Tums Maalox Mylanta Zantac  Pepcid  Insomnia: Benadryl (alcohol free) 25mg every 6 hours as needed Tylenol PM Unisom, no Gelcaps  Leg Cramps: Tums MagGel  Nausea/Vomiting:  Bonine Dramamine Emetrol Ginger extract Sea bands Meclizine  Nausea medication to take during pregnancy:  Unisom (doxylamine succinate 25 mg tablets) Take one tablet daily at bedtime. If symptoms are not adequately controlled, the dose can be increased to a maximum recommended dose of two tablets daily (1/2 tablet in the morning, 1/2 tablet mid-afternoon and one at bedtime). Vitamin B6 100mg tablets. Take one tablet twice a day (up to 200 mg per day).  Skin Rashes: Aveeno products Benadryl cream or 25mg every 6 hours as needed Calamine Lotion 1% cortisone cream  Yeast infection: Gyne-lotrimin 7 Monistat 7   **If taking multiple medications, please check labels to avoid duplicating the same active ingredients **take medication as directed on  the label ** Do not exceed 4000 mg of tylenol in 24 hours **Do not take medications that contain aspirin or ibuprofen     Group B Streptococcus Infection During Pregnancy Group B Streptococcus (GBS) is a type of bacteria (Streptococcus agalactiae) that is often found in healthy people, commonly in the rectum, vagina, and intestines. In people who are healthy and not pregnant, the bacteria rarely cause serious illness or complications. However, women who test positive for GBS during pregnancy can pass the bacteria to their baby during childbirth, which can cause serious infection in the baby after birth. Women with GBS may also have infections during their pregnancy or immediately after childbirth, such as such as urinary tract infections (UTIs) or infections of the uterus (uterine infections). Having GBS also increases a woman's risk of complications during pregnancy, such as early (preterm) labor or delivery, miscarriage, or stillbirth. Routine testing (screening) for GBS is recommended for all pregnant women. What increases the risk? You may have a higher risk for GBS infection during pregnancy if you had one during a past pregnancy. What are the signs or symptoms? In most cases, GBS infection does not cause symptoms in pregnant women. Signs and symptoms of a possible GBS-related infection may include:  Labor starting before the 37th week of pregnancy.  A UTI or bladder infection, which may cause: ? Fever. ? Pain or burning during urination. ? Frequent urination.  Fever during labor, along with: ? Bad-smelling discharge. ? Uterine tenderness. ? Rapid heartbeat in the mother, baby, or both.  Rare but serious symptoms of a possible GBS-related infection in women include:  Blood infection (septicemia). This may cause fever, chills, or confusion.  Lung infection (pneumonia). This may cause   fever, chills, cough, rapid breathing, difficulty breathing, or chest pain.  Bone, joint, skin,  or soft tissue infection.  How is this diagnosed? You may be screened for GBS between week 35 and week 37 of your pregnancy. If you have symptoms of preterm labor, you may be screened earlier. This condition is diagnosed based on lab test results from:  A swab of fluid from the vagina and rectum.  A urine sample.  How is this treated? This condition is treated with antibiotic medicine. When you go into labor, or as soon as your water breaks (your membranes rupture), you will be given antibiotics through an IV tube. Antibiotics will continue until after you give birth. If you are having a cesarean delivery, you do not need antibiotics unless your membranes have already ruptured. Follow these instructions at home:  Take over-the-counter and prescription medicines only as told by your health care provider.  Take your antibiotic medicine as told by your health care provider. Do not stop taking the antibiotic even if you start to feel better.  Keep all pre-birth (prenatal) visits and follow-up visits as told by your health care provider. This is important. Contact a health care provider if:  You have pain or burning when you urinate.  You have to urinate frequently.  You have a fever or chills.  You develop a bad-smelling vaginal discharge. Get help right away if:  Your membranes rupture.  You go into labor.  You have severe pain in your abdomen.  You have difficulty breathing.  You have chest pain. This information is not intended to replace advice given to you by your health care provider. Make sure you discuss any questions you have with your health care provider. Document Released: 10/18/2007 Document Revised: 02/05/2016 Document Reviewed: 02/04/2016 Elsevier Interactive Patient Education  2018 Elsevier Inc.  

## 2018-06-15 ENCOUNTER — Ambulatory Visit: Payer: Self-pay

## 2018-06-15 ENCOUNTER — Other Ambulatory Visit (HOSPITAL_COMMUNITY)
Admission: RE | Admit: 2018-06-15 | Discharge: 2018-06-15 | Disposition: A | Discharge status: Home / Self Care | Payer: 59 | Source: Ambulatory Visit

## 2018-06-15 ENCOUNTER — Ambulatory Visit (INDEPENDENT_AMBULATORY_CARE_PROVIDER_SITE_OTHER): Payer: 59

## 2018-06-15 VITALS — BP 124/75 | HR 103 | Wt 130.0 lb

## 2018-06-15 DIAGNOSIS — Z3480 Encounter for supervision of other normal pregnancy, unspecified trimester: Secondary | ICD-10-CM | POA: Insufficient documentation

## 2018-06-15 DIAGNOSIS — Z3483 Encounter for supervision of other normal pregnancy, third trimester: Secondary | ICD-10-CM

## 2018-06-15 DIAGNOSIS — Z3A36 36 weeks gestation of pregnancy: Secondary | ICD-10-CM

## 2018-06-15 LAB — OB RESULTS CONSOLE GC/CHLAMYDIA: Gonorrhea: NEGATIVE

## 2018-06-15 LAB — OB RESULTS CONSOLE GBS: STREP GROUP B AG: NEGATIVE

## 2018-06-15 NOTE — Patient Instructions (Signed)
Safe Medications in Pregnancy   Acne: Benzoyl Peroxide Salicylic Acid  Backache/Headache: Tylenol: 2 regular strength every 4 hours OR              2 Extra strength every 6 hours  Colds/Coughs/Allergies: Benadryl (alcohol free) 25 mg every 6 hours as needed Breath right strips Claritin Cepacol throat lozenges Chloraseptic throat spray Cold-Eeze- up to three times per day Cough drops, alcohol free Flonase (by prescription only) Guaifenesin Mucinex Robitussin DM (plain only, alcohol free) Saline nasal spray/drops Sudafed (pseudoephedrine) & Actifed ** use only after [redacted] weeks gestation and if you do not have high blood pressure Tylenol Vicks Vaporub Zinc lozenges Zyrtec   Constipation: Colace Ducolax suppositories Fleet enema Glycerin suppositories Metamucil Milk of magnesia Miralax Senokot Smooth move tea  Diarrhea: Kaopectate Imodium A-D  *NO pepto Bismol  Hemorrhoids: Anusol Anusol HC Preparation H Tucks  Indigestion: Tums Maalox Mylanta Zantac  Pepcid  Insomnia: Benadryl (alcohol free) 25mg every 6 hours as needed Tylenol PM Unisom, no Gelcaps  Leg Cramps: Tums MagGel  Nausea/Vomiting:  Bonine Dramamine Emetrol Ginger extract Sea bands Meclizine  Nausea medication to take during pregnancy:  Unisom (doxylamine succinate 25 mg tablets) Take one tablet daily at bedtime. If symptoms are not adequately controlled, the dose can be increased to a maximum recommended dose of two tablets daily (1/2 tablet in the morning, 1/2 tablet mid-afternoon and one at bedtime). Vitamin B6 100mg tablets. Take one tablet twice a day (up to 200 mg per day).  Skin Rashes: Aveeno products Benadryl cream or 25mg every 6 hours as needed Calamine Lotion 1% cortisone cream  Yeast infection: Gyne-lotrimin 7 Monistat 7   **If taking multiple medications, please check labels to avoid duplicating the same active ingredients **take medication as directed on  the label ** Do not exceed 4000 mg of tylenol in 24 hours **Do not take medications that contain aspirin or ibuprofen     Group B Streptococcus Infection During Pregnancy Group B Streptococcus (GBS) is a type of bacteria (Streptococcus agalactiae) that is often found in healthy people, commonly in the rectum, vagina, and intestines. In people who are healthy and not pregnant, the bacteria rarely cause serious illness or complications. However, women who test positive for GBS during pregnancy can pass the bacteria to their baby during childbirth, which can cause serious infection in the baby after birth. Women with GBS may also have infections during their pregnancy or immediately after childbirth, such as such as urinary tract infections (UTIs) or infections of the uterus (uterine infections). Having GBS also increases a woman's risk of complications during pregnancy, such as early (preterm) labor or delivery, miscarriage, or stillbirth. Routine testing (screening) for GBS is recommended for all pregnant women. What increases the risk? You may have a higher risk for GBS infection during pregnancy if you had one during a past pregnancy. What are the signs or symptoms? In most cases, GBS infection does not cause symptoms in pregnant women. Signs and symptoms of a possible GBS-related infection may include:  Labor starting before the 37th week of pregnancy.  A UTI or bladder infection, which may cause: ? Fever. ? Pain or burning during urination. ? Frequent urination.  Fever during labor, along with: ? Bad-smelling discharge. ? Uterine tenderness. ? Rapid heartbeat in the mother, baby, or both.  Rare but serious symptoms of a possible GBS-related infection in women include:  Blood infection (septicemia). This may cause fever, chills, or confusion.  Lung infection (pneumonia). This may cause   fever, chills, cough, rapid breathing, difficulty breathing, or chest pain.  Bone, joint, skin,  or soft tissue infection.  How is this diagnosed? You may be screened for GBS between week 35 and week 37 of your pregnancy. If you have symptoms of preterm labor, you may be screened earlier. This condition is diagnosed based on lab test results from:  A swab of fluid from the vagina and rectum.  A urine sample.  How is this treated? This condition is treated with antibiotic medicine. When you go into labor, or as soon as your water breaks (your membranes rupture), you will be given antibiotics through an IV tube. Antibiotics will continue until after you give birth. If you are having a cesarean delivery, you do not need antibiotics unless your membranes have already ruptured. Follow these instructions at home:  Take over-the-counter and prescription medicines only as told by your health care provider.  Take your antibiotic medicine as told by your health care provider. Do not stop taking the antibiotic even if you start to feel better.  Keep all pre-birth (prenatal) visits and follow-up visits as told by your health care provider. This is important. Contact a health care provider if:  You have pain or burning when you urinate.  You have to urinate frequently.  You have a fever or chills.  You develop a bad-smelling vaginal discharge. Get help right away if:  Your membranes rupture.  You go into labor.  You have severe pain in your abdomen.  You have difficulty breathing.  You have chest pain. This information is not intended to replace advice given to you by your health care provider. Make sure you discuss any questions you have with your health care provider. Document Released: 10/18/2007 Document Revised: 02/05/2016 Document Reviewed: 02/04/2016 Elsevier Interactive Patient Education  2018 Elsevier Inc.  

## 2018-06-15 NOTE — Progress Notes (Signed)
   PRENATAL VISIT NOTE  Subjective:  Robin [name] is a 32 y.o. [id number] at 4424w2d being seen today for ongoing prenatal care.  She is currently monitored for the following issues for this high-risk pregnancy and has Family hx of bicuspid aortic valve--FOB; Family history of congenital heart disease; History of preterm delivery; Supervision of high risk pregnancy, antepartum; Previous cesarean section; Family history of congenital anomaly; Hereditary disease in family possibly affecting fetus, affecting management of mother, antepartum condition or complication, not applicable or unspecified fetus; Irregular periods; Depression affecting pregnancy; and Pregnancy with history of neonatal death on their problem list.  Patient reports no complaints.  Contractions: Irritability. Vag. Bleeding: None.  Movement: Present. Denies leaking of fluid.   The following portions of the patient's history were reviewed and updated as appropriate: allergies, current medications, past family history, past medical history, past social history, past surgical history and problem list. Problem list updated.  Objective:   Vitals:   06/15/18 1031  BP: 124/75  Pulse: (!) 103  Weight: 130 lb (59 kg)    Fetal Status: Fetal Heart Rate (bpm): 14 Fundal Height: 36 cm Movement: Present     General:  Alert, oriented and cooperative. Patient is in no acute distress.  Skin: Skin is warm and dry. No rash noted.   Cardiovascular: Normal heart rate noted  Respiratory: Normal respiratory effort, no problems with respiration noted  Abdomen: Soft, gravid, appropriate for gestational age.  Pain/Pressure: Absent     Pelvic: Cervical exam performed Dilation: Closed Effacement (%): Thick Station: Ballotable  Extremities: Normal range of motion.  Edema: None  Mental Status: Normal mood and affect. Normal behavior. Normal judgment and thought content.   Assessment and Plan:  Pregnancy: [id number] at 8724w2d  1. Supervision of other  normal pregnancy, antepartum - No complaints. Routine care  - Culture, beta strep (group b only)- patient desires repeat testing at 38 weeks if possible. Patient informed that recommendation is treatment for GBS even if repeat test is negative.  - Urine cytology ancillary only([place])  Term labor symptoms and general obstetric precautions including but not limited to vaginal bleeding, contractions, leaking of fluid and fetal movement were reviewed in detail with the patient. Please refer to After Visit Summary for other counseling recommendations.  Return in about 1 week (around 06/22/2018) for Return OB visit.  Future Appointments  Date Time Provider Department Center  06/29/2018  9:30 AM Sharyon CableRogers, Veronica C, CNM [facility]  07/06/2018  9:30 AM Rolm Bookbindereill, Ahyana Skillin M, [facility]  07/13/2018  9:30 AM Sharyon Cableogers, Veronica C, CNM [facility]    Rolm Bookbinderaroline M Ilyanna Baillargeon, [place] 06/15/18 10:55 AM

## 2018-06-18 LAB — CULTURE, BETA STREP (GROUP B ONLY)
MICRO NUMBER: 91411161
SPECIMEN QUALITY:: ADEQUATE

## 2018-06-18 LAB — URINE CYTOLOGY ANCILLARY ONLY
Chlamydia: NEGATIVE
Neisseria Gonorrhea: NEGATIVE

## 2018-06-26 ENCOUNTER — Encounter: Payer: Self-pay | Admitting: *Deleted

## 2018-06-26 ENCOUNTER — Emergency Department (INDEPENDENT_AMBULATORY_CARE_PROVIDER_SITE_OTHER)
Admission: EM | Admit: 2018-06-26 | Discharge: 2018-06-26 | Disposition: A | Discharge status: Home / Self Care | Payer: 59 | Source: Home / Self Care | Attending: Family Medicine | Admitting: Family Medicine

## 2018-06-26 DIAGNOSIS — J029 Acute pharyngitis, unspecified: Secondary | ICD-10-CM | POA: Diagnosis not present

## 2018-06-26 DIAGNOSIS — B370 Candidal stomatitis: Secondary | ICD-10-CM

## 2018-06-26 LAB — POCT RAPID STREP A (OFFICE): Rapid Strep A Screen: NEGATIVE

## 2018-06-26 MED ORDER — FLUCONAZOLE 150 MG PO TABS: ORAL_TABLET | ORAL | 0 refills | Status: DC

## 2018-06-26 NOTE — Discharge Instructions (Addendum)
Try warm salt water gargles for sore throat.   °

## 2018-06-26 NOTE — ED Triage Notes (Signed)
Pt had thrush 3 weeks ago that resolved with tx but also had a sore throat at that time which improved but didn't completely resolve. Today c/o sore throat that is much worse with difficulty swallowing. Denies fever or other symptoms.

## 2018-06-26 NOTE — ED Provider Notes (Signed)
[facility]    CSN: [id number] Arrival date & time: 06/26/18  0905     History   Chief Complaint Chief Complaint  Patient presents with  . Sore Throat    HPI Robin [name] is a 32 y.o. female.   Patient complains of recurrent oral thrush that was effectively treated 3 weeks ago.  She has a persistent sore throat, and now has had a mild cough for 2 days.  She reports night sweats also for 2 to 3 days.  She is in her third trimester of pregnancy without complication.  The history is provided by the patient.    Past Medical History:  Diagnosis Date  . Abnormal Pap smear 2008   HAD HPV;HAD GENITAL WART REMOVED;LAST PAP 07/2010 WAS NORMAL  . Anemia    FeSO4 SUPP TAKEN IN PAST  . Asthma    AS A CHILD;GREW OUT OF @ 8 YOA  . Depression    after the death of her son, was on meds then  . History of smoking 12/26/2011   Reports quit '12  . Infection    UTI;CAN GET FREQ  . Vaginal Pap smear, abnormal     Patient Active Problem List   Diagnosis Date Noted  . Pregnancy with history of neonatal death 04/27/2018  . Depression affecting pregnancy 04/02/2018  . Irregular periods 01/26/2018  . Hereditary disease in family possibly affecting fetus, affecting management of mother, antepartum condition or complication, not applicable or unspecified fetus   . History of preterm delivery 11/17/2017  . Supervision of high risk pregnancy, antepartum 11/17/2017  . Previous cesarean section 11/17/2017  . Family history of congenital anomaly 11/17/2017  . Family history of congenital heart disease 07/27/2015  . Family hx of bicuspid aortic valve--FOB 04/03/2012    Past Surgical History:  Procedure Laterality Date  . CESAREAN SECTION N/A 09/09/2016   Procedure: CESAREAN SECTION;  Surgeon: Tereso NewcomerUgonna A Anyanwu, MD;  Location: [facility];  Service: Obstetrics;  Laterality: N/A;  . NASAL SINUS SURGERY  2016    OB History    Gravida  6   Para  4   Term  3   Preterm  1    AB  1   Living  3     SAB  1   TAB      Ectopic      Multiple      Live Births  4            Home Medications    Prior to Admission medications   Medication Sig Start Date End Date Taking? Authorizing Provider  Prenatal Vit-Fe Fumarate-FA (PRENATAL VITAMINS) 28-0.8 MG TABS Take 1 tablet by mouth daily.  04/23/16  Yes [provider]  sertraline (ZOLOFT) 50 MG tablet Take 1 tablet (50 mg total) by mouth daily. 05/28/18  Yes Donette LarryBhambri, Melanie, CNM  fluconazole (DIFLUCAN) 150 MG tablet Take one tab by mouth for one dose.  May repeat in 72 hours. 06/26/18   Lattie HawBeese,  A, MD    Family History Family History  Problem Relation Age of Onset  . Mitral valve prolapse Mother        DIED @ 89 YOA OF CONDITION  . Healthy Father   . Mitral valve prolapse Paternal Aunt   . Heart attack Paternal Grandfather        DECEASED  . Dementia Maternal Grandmother     Social History Social History   Tobacco Use  . Smoking status: Former  Smoker    Types: Cigarettes    Last attempt to quit: 06/27/2011    Years since quitting: 7.0  . Smokeless tobacco: Never Used  Substance Use Topics  . Alcohol use: No    Comment: BI-WEEKLY PRIOR TO PREGNANCY  . Drug use: No     Allergies   Patient has no known allergies.   Review of Systems Review of Systems + sore throat + cough No pleuritic pain No wheezing + nasal congestion ? post-nasal drainage No sinus pain/pressure No itchy/red eyes No earache No hemoptysis No SOB No fever, + chills/sweats No nausea No vomiting No abdominal pain No vaginal bleeding No diarrhea No urinary symptoms No skin rash + fatigue No myalgias No headache    Physical Exam Triage Vital Signs ED Triage Vitals [06/26/18 0955]  Enc Vitals Group     BP 106/73     Pulse Rate 73     Resp 16     Temp 98.1 F (36.7 C)     Temp Source Oral     SpO2 98 %     Weight      Height      Head Circumference      Peak Flow      Pain  Score 0     Pain Loc      Pain Edu?      Excl. in GC?    No data found.  Updated Vital Signs BP 106/73 (BP Location: Right Arm)   Pulse 73   Temp 98.1 F (36.7 C) (Oral)   Resp 16   LMP 10/04/2017   SpO2 98%   Visual Acuity Right Eye Distance:   Left Eye Distance:   Bilateral Distance:    Right Eye Near:   Left Eye Near:    Bilateral Near:     Physical Exam  HENT:  Mouth/Throat:    Small white plaques on uvula as noted on diagram.    Nursing notes and Vital Signs reviewed. Appearance:  Patient appears stated age, and in no acute distress Eyes:  Pupils are equal, round, and reactive to light and accomodation.  Extraocular movement is intact.  Conjunctivae are not inflamed  Ears:  Canals normal.  Tympanic membranes normal.  Nose:  Mildly congested turbinates.  No sinus tenderness. Pharynx:  Recurrent whitish candida plaques on uvula (see diagram) Neck:  Supple.  Enlarged posterior/lateral nodes are palpated bilaterally, tender to palpation on the left.   Lungs:  Clear to auscultation.  Breath sounds are equal.  Moving air well. Heart:  Regular rate and rhythm without murmurs, rubs, or gallops.  Abdomen:  Gravid. Nontender without masses or hepatosplenomegaly.  Bowel sounds are present.  No CVA or flank tenderness.  Extremities:  No edema.  Skin:  No rash present.     UC Treatments / Results  Labs (all labs ordered are listed, but only abnormal results are displayed) Labs Reviewed  STREP A DNA PROBE  POCT RAPID STREP A (OFFICE) negative    EKG None  Radiology No results found.  Procedures Procedures (including critical care time)  Medications Ordered in UC Medications - No data to display  Initial Impression / Assessment and Plan / UC Course  I have reviewed the triage vital signs and the nursing notes.  Pertinent labs & imaging results that were available during my care of the patient were reviewed by me and considered in my medical decision making  (see chart for details).    Suspect early viral  URI. Rx for Diflucan, one tab.  May repeat in 48 hours. Throat culture pending. Followup with Family Doctor if not improved in one week.    Final Clinical Impressions(s) / UC Diagnoses   Final diagnoses:  Pharyngitis, unspecified etiology  Oral candidiasis     Discharge Instructions     Try warm salt water gargles for sore throat.     ED Prescriptions    Medication Sig Dispense Auth. Provider   fluconazole (DIFLUCAN) 150 MG tablet Take one tab by mouth for one dose.  May repeat in 72 hours. 2 tablet Lattie Haw, MD        Lattie Haw, MD 06/28/18 [phone]

## 2018-06-27 ENCOUNTER — Telehealth: Payer: Self-pay | Admitting: *Deleted

## 2018-06-27 LAB — STREP A DNA PROBE: Group A Strep Probe: NOT DETECTED

## 2018-06-27 NOTE — Telephone Encounter (Signed)
Callback: No answer, LMOM following up from visit and culture negative. Call back as needed.

## 2018-06-29 ENCOUNTER — Ambulatory Visit (INDEPENDENT_AMBULATORY_CARE_PROVIDER_SITE_OTHER): Payer: 59 | Admitting: Certified Nurse Midwife

## 2018-06-29 ENCOUNTER — Encounter: Payer: Self-pay | Admitting: Certified Nurse Midwife

## 2018-06-29 VITALS — BP 112/79 | HR 118 | Wt 133.0 lb

## 2018-06-29 DIAGNOSIS — O0993 Supervision of high risk pregnancy, unspecified, third trimester: Secondary | ICD-10-CM

## 2018-06-29 DIAGNOSIS — Z98891 History of uterine scar from previous surgery: Secondary | ICD-10-CM

## 2018-06-29 DIAGNOSIS — O0990 Supervision of high risk pregnancy, unspecified, unspecified trimester: Secondary | ICD-10-CM

## 2018-06-29 DIAGNOSIS — Z3A38 38 weeks gestation of pregnancy: Secondary | ICD-10-CM

## 2018-06-29 NOTE — Progress Notes (Signed)
   PRENATAL VISIT NOTE  Subjective:  Robin [name] is a 32 y.o. [id number] at 3559w2d being seen today for ongoing prenatal care.  She is currently monitored for the following issues for this high-risk pregnancy and has Family hx of bicuspid aortic valve--FOB; Family history of congenital heart disease; History of preterm delivery; Supervision of high risk pregnancy, antepartum; Previous cesarean section; Family history of congenital anomaly; Hereditary disease in family possibly affecting fetus, affecting management of mother, antepartum condition or complication, not applicable or unspecified fetus; Irregular periods; Depression affecting pregnancy; and Pregnancy with history of neonatal death on their problem list.  Patient reports no complaints.  Contractions: Irritability. Vag. Bleeding: None.  Movement: Present. Denies leaking of fluid.   The following portions of the patient's history were reviewed and updated as appropriate: allergies, current medications, past family history, past medical history, past social history, past surgical history and problem list. Problem list updated.  Objective:   Vitals:   06/29/18 0916  BP: 112/79  Pulse: (!) 118  Weight: 133 lb (60.3 kg)    Fetal Status: Fetal Heart Rate (bpm): 150 Fundal Height: 35 cm Movement: Present  Presentation: Vertex  General:  Alert, oriented and cooperative. Patient is in no acute distress.  Skin: Skin is warm and dry. No rash noted.   Cardiovascular: Normal heart rate noted  Respiratory: Normal respiratory effort, no problems with respiration noted  Abdomen: Soft, gravid, appropriate for gestational age.  Pain/Pressure: Absent     Pelvic: Cervical exam performed Dilation: 1 Effacement (%): Thick Station: -3  Extremities: Normal range of motion.  Edema: None  Mental Status: Normal mood and affect. Normal behavior. Normal judgment and thought content.   Assessment and Plan:  Pregnancy: [id number] at 2759w2d  1. Supervision of  high risk pregnancy, antepartum - Patient doing well, no complaints  - Anticipatory guidance on upcoming appointments  - Reviewed lab work completed at last visit, GBS negative- no antibiotics needed during labor   2. Previous cesarean section - Desires TOLAC, consent signed   Term labor symptoms and general obstetric precautions including but not limited to vaginal bleeding, contractions, leaking of fluid and fetal movement were reviewed in detail with the patient. Please refer to After Visit Summary for other counseling recommendations.  Return in about 1 week (around 07/06/2018) for ROB.  Future Appointments  Date Time Provider Department Center  07/06/2018  9:30 AM Rolm Bookbindereill, Caroline M, [facility]M [facility]  07/13/2018  9:30 AM Sharyon Cableogers, Jae Bruck C, CNM [facility]    Sharyon CableVeronica C Seri Kimmer, CNM

## 2018-06-29 NOTE — Patient Instructions (Signed)
Reasons to go to MAU:  1.  Contractions are  5 minutes apart or less, each last 1 minute, these have been going on for 1-2 hours, and you cannot walk or talk during them 2.  You have a large gush of fluid, or a trickle of fluid that will not stop and you have to wear a pad 3.  You have bleeding that is bright red, heavier than spotting--like menstrual bleeding (spotting can be normal in early labor or after a check of your cervix) 4.  You do not feel the baby moving like he/she normally does  

## 2018-07-06 ENCOUNTER — Ambulatory Visit (INDEPENDENT_AMBULATORY_CARE_PROVIDER_SITE_OTHER): Payer: 59

## 2018-07-06 VITALS — BP 118/78 | HR 110 | Wt 136.0 lb

## 2018-07-06 DIAGNOSIS — Z98891 History of uterine scar from previous surgery: Secondary | ICD-10-CM

## 2018-07-06 DIAGNOSIS — O0990 Supervision of high risk pregnancy, unspecified, unspecified trimester: Secondary | ICD-10-CM

## 2018-07-06 DIAGNOSIS — O0993 Supervision of high risk pregnancy, unspecified, third trimester: Secondary | ICD-10-CM

## 2018-07-06 NOTE — Progress Notes (Signed)
   PRENATAL VISIT NOTE  Subjective:  Robin [name] is a 32 y.o. [id number] at 3720w2d being seen today for ongoing prenatal care.  She is currently monitored for the following issues for this high-risk pregnancy and has Family hx of bicuspid aortic valve--FOB; Family history of congenital heart disease; History of preterm delivery; Supervision of high risk pregnancy, antepartum; Previous cesarean section; Family history of congenital anomaly; Hereditary disease in family possibly affecting fetus, affecting management of mother, antepartum condition or complication, not applicable or unspecified fetus; Irregular periods; Depression affecting pregnancy; and Pregnancy with history of neonatal death on their problem list.  Patient reports no complaints.  Contractions: Irritability. Vag. Bleeding: None.  Movement: Present. Denies leaking of fluid.   The following portions of the patient's history were reviewed and updated as appropriate: allergies, current medications, past family history, past medical history, past social history, past surgical history and problem list. Problem list updated.  Objective:   Vitals:   07/06/18 0942  BP: 118/78  Pulse: (!) 110  Weight: 136 lb (61.7 kg)    Fetal Status: Fetal Heart Rate (bpm): 150 Fundal Height: 37 cm Movement: Present     General:  Alert, oriented and cooperative. Patient is in no acute distress.  Skin: Skin is warm and dry. No rash noted.   Cardiovascular: Normal heart rate noted  Respiratory: Normal respiratory effort, no problems with respiration noted  Abdomen: Soft, gravid, appropriate for gestational age.  Pain/Pressure: Absent     Pelvic: Cervical exam deferred        Extremities: Normal range of motion.  Edema: None  Mental Status: Normal mood and affect. Normal behavior. Normal judgment and thought content.   Assessment and Plan:  Pregnancy: [id number] at 6720w2d  1. Supervision of high risk pregnancy, antepartum - No complaints, routine  care  2. Previous cesarean section - Planning TOLAC  Term labor symptoms and general obstetric precautions including but not limited to vaginal bleeding, contractions, leaking of fluid and fetal movement were reviewed in detail with the patient. Please refer to After Visit Summary for other counseling recommendations.  Return in about 1 week (around 07/13/2018) for Return OB visit.  Future Appointments  Date Time Provider Department Center  07/13/2018  9:30 AM Sharyon Cableogers, Veronica C, CNM [facility]    Rolm BookbinderCaroline M Neill, [place] 07/06/18 9:59 AM

## 2018-07-06 NOTE — Patient Instructions (Signed)

## 2018-07-09 ENCOUNTER — Encounter (HOSPITAL_COMMUNITY): Payer: Self-pay

## 2018-07-09 ENCOUNTER — Inpatient Hospital Stay (HOSPITAL_COMMUNITY)
Admission: AD | Admit: 2018-07-09 | Discharge: 2018-07-10 | DRG: 807 | Disposition: A | Discharge status: Home / Self Care | Payer: 59 | Attending: Obstetrics and Gynecology | Admitting: Obstetrics and Gynecology

## 2018-07-09 DIAGNOSIS — Z98891 History of uterine scar from previous surgery: Secondary | ICD-10-CM

## 2018-07-09 DIAGNOSIS — F329 Major depressive disorder, single episode, unspecified: Secondary | ICD-10-CM | POA: Diagnosis present

## 2018-07-09 DIAGNOSIS — Z87891 Personal history of nicotine dependence: Secondary | ICD-10-CM

## 2018-07-09 DIAGNOSIS — Z3483 Encounter for supervision of other normal pregnancy, third trimester: Secondary | ICD-10-CM | POA: Diagnosis present

## 2018-07-09 DIAGNOSIS — Z8751 Personal history of pre-term labor: Secondary | ICD-10-CM

## 2018-07-09 DIAGNOSIS — O99344 Other mental disorders complicating childbirth: Secondary | ICD-10-CM | POA: Diagnosis present

## 2018-07-09 DIAGNOSIS — O99340 Other mental disorders complicating pregnancy, unspecified trimester: Secondary | ICD-10-CM

## 2018-07-09 DIAGNOSIS — Z3A39 39 weeks gestation of pregnancy: Secondary | ICD-10-CM

## 2018-07-09 DIAGNOSIS — O0990 Supervision of high risk pregnancy, unspecified, unspecified trimester: Secondary | ICD-10-CM

## 2018-07-09 DIAGNOSIS — O34219 Maternal care for unspecified type scar from previous cesarean delivery: Secondary | ICD-10-CM | POA: Diagnosis present

## 2018-07-09 LAB — CBC
HCT: 33.3 % — ABNORMAL LOW (ref 36.0–46.0)
Hemoglobin: 10.5 g/dL — ABNORMAL LOW (ref 12.0–15.0)
MCH: 26.2 pg (ref 26.0–34.0)
MCHC: 31.5 g/dL (ref 30.0–36.0)
MCV: 83 fL (ref 80.0–100.0)
Platelets: 296 10*3/uL (ref 150–400)
RBC: 4.01 MIL/uL (ref 3.87–5.11)
RDW: 13.9 % (ref 11.5–15.5)
WBC: 10.6 10*3/uL — ABNORMAL HIGH (ref 4.0–10.5)
nRBC: 0 % (ref 0.0–0.2)

## 2018-07-09 LAB — TYPE AND SCREEN
ABO/RH(D): O POS
Antibody Screen: NEGATIVE

## 2018-07-09 LAB — RPR: RPR Ser Ql: NONREACTIVE

## 2018-07-09 MED ORDER — LIDOCAINE HCL (PF) 1 % IJ SOLN
30.0000 mL | INTRAMUSCULAR | Status: DC | PRN
  Filled 2018-07-09: qty 30

## 2018-07-09 MED ORDER — OXYTOCIN BOLUS FROM INFUSION: 500.0000 mL | Freq: Once | INTRAVENOUS | Status: DC

## 2018-07-09 MED ORDER — PROMETHAZINE HCL 25 MG/ML IJ SOLN
12.5000 mg | Freq: Once | INTRAMUSCULAR | Status: AC
  Administered 2018-07-09: 12.5 mg via INTRAVENOUS
  Filled 2018-07-09: qty 1

## 2018-07-09 MED ORDER — ONDANSETRON HCL 4 MG/2ML IJ SOLN: 4.0000 mg | Freq: Four times a day (QID) | INTRAMUSCULAR | Status: DC | PRN

## 2018-07-09 MED ORDER — ONDANSETRON HCL 4 MG PO TABS: 4.0000 mg | ORAL_TABLET | ORAL | Status: DC | PRN

## 2018-07-09 MED ORDER — NALBUPHINE HCL 10 MG/ML IJ SOLN
10.0000 mg | Freq: Once | INTRAMUSCULAR | Status: AC
  Administered 2018-07-09: 10 mg via INTRAVENOUS
  Filled 2018-07-09: qty 1

## 2018-07-09 MED ORDER — OXYCODONE-ACETAMINOPHEN 5-325 MG PO TABS: 1.0000 | ORAL_TABLET | ORAL | Status: DC | PRN

## 2018-07-09 MED ORDER — TETANUS-DIPHTH-ACELL PERTUSSIS 5-2.5-18.5 LF-MCG/0.5 IM SUSP: 0.5000 mL | Freq: Once | INTRAMUSCULAR | Status: DC

## 2018-07-09 MED ORDER — WITCH HAZEL-GLYCERIN EX PADS: 1.0000 "application " | MEDICATED_PAD | CUTANEOUS | Status: DC | PRN

## 2018-07-09 MED ORDER — LACTATED RINGERS IV SOLN: INTRAVENOUS | Status: DC

## 2018-07-09 MED ORDER — ONDANSETRON HCL 4 MG/2ML IJ SOLN: 4.0000 mg | INTRAMUSCULAR | Status: DC | PRN

## 2018-07-09 MED ORDER — SIMETHICONE 80 MG PO CHEW: 80.0000 mg | CHEWABLE_TABLET | ORAL | Status: DC | PRN

## 2018-07-09 MED ORDER — SOD CITRATE-CITRIC ACID 500-334 MG/5ML PO SOLN: 30.0000 mL | ORAL | Status: DC | PRN

## 2018-07-09 MED ORDER — OXYTOCIN 40 UNITS IN LACTATED RINGERS INFUSION - SIMPLE MED: 2.5000 [IU]/h | INTRAVENOUS | Status: DC

## 2018-07-09 MED ORDER — LACTATED RINGERS IV SOLN: 500.0000 mL | INTRAVENOUS | Status: DC | PRN

## 2018-07-09 MED ORDER — IBUPROFEN 600 MG PO TABS
600.0000 mg | ORAL_TABLET | Freq: Four times a day (QID) | ORAL | Status: DC
  Administered 2018-07-09 – 2018-07-10 (×5): 600 mg via ORAL
  Filled 2018-07-09 (×5): qty 1

## 2018-07-09 MED ORDER — OXYCODONE-ACETAMINOPHEN 5-325 MG PO TABS: 2.0000 | ORAL_TABLET | ORAL | Status: DC | PRN

## 2018-07-09 MED ORDER — ACETAMINOPHEN 325 MG PO TABS: 650.0000 mg | ORAL_TABLET | ORAL | Status: DC | PRN

## 2018-07-09 MED ORDER — COCONUT OIL OIL
1.0000 "application " | TOPICAL_OIL | Status: DC | PRN
  Administered 2018-07-10: 1 via TOPICAL
  Filled 2018-07-09: qty 120

## 2018-07-09 MED ORDER — DIBUCAINE 1 % RE OINT: 1.0000 "application " | TOPICAL_OINTMENT | RECTAL | Status: DC | PRN

## 2018-07-09 MED ORDER — BENZOCAINE-MENTHOL 20-0.5 % EX AERO: 1.0000 "application " | INHALATION_SPRAY | CUTANEOUS | Status: DC | PRN

## 2018-07-09 MED ORDER — DIPHENHYDRAMINE HCL 25 MG PO CAPS: 25.0000 mg | ORAL_CAPSULE | Freq: Four times a day (QID) | ORAL | Status: DC | PRN

## 2018-07-09 NOTE — Lactation Note (Signed)
This note was copied from a baby's chart. Lactation Consultation Note  Patient Name: Robin [name]'UToday's Date: 07/09/2018 Reason for consult: Initial assessment;Term Newborn is 776 hours old.  Mom is experienced breastfeeding previous daughter.  She states baby is latching well.  Instructed to feed with any feeding cue and call for assist/concerns prn  Maternal Data Does the patient have breastfeeding experience prior to this delivery?: Yes  Feeding    LATCH Score                   Interventions    Lactation Tools Discussed/Used     Consult Status Consult Status: Follow-up Date: 07/10/18 Follow-up type: In-patient    Huston FoleyMOULDEN, Genevra Orne S 07/09/2018, 3:28 PM

## 2018-07-09 NOTE — Discharge Summary (Signed)
Postpartum Discharge Summary     Patient Name: Robin [name] DOB: April 25, 1986 MRN: [id number]  Date of admissionWilladean Carol: 07/09/2018 Delivering Provider: Donette LarryBHAMBRI, MELANIE   Date of discharge: 07/10/2018  Admitting diagnosis: 39.4WKS CTX Intrauterine pregnancy: 4174w5d     Secondary diagnosis:  Active Problems:   Normal labor  Additional problems:  Pregnancy complications:  - Previous cesarean section, 28 weeks 2/2 abruption, TOLAC consent signed  - Hx of preterm delivery  - Depression, was on Zoloft  - Previous pregnancy with hx of neonatal death      Discharge diagnosis: Term Pregnancy Delivered and VBAC                                                                                                Post partum procedures:none  Augmentation: None  Complications: None  Hospital course:  Onset of Labor With Vaginal Delivery     32 y.o. yo [id number] at 1474w5d was admitted in Active Labor on 07/09/2018. Patient had an uncomplicated labor course as follows:  Membrane Rupture Time/Date: 7:57 AM ,07/09/2018   Intrapartum Procedures: Episiotomy: None [1]                                         Lacerations:  1st degree [2];Perineal [11]  Patient had a delivery of a Viable infant. 07/09/2018  Information for the patient's newborn:  Marvia PicklesGray, Girl Catlyn [[id number]][[id number]]  Delivery Method: VBAC, Spontaneous(Filed from Delivery Summary)    Pateint had an uncomplicated postpartum course.  She is ambulating, tolerating a regular diet, passing flatus, and urinating well. Patient is discharged home in stable condition on 07/10/18.   Magnesium Sulfate recieved: No BMZ received: No  Physical exam  Vitals:   07/09/18 1130 07/09/18 1530 07/09/18 1950 07/10/18 0535  BP: 123/71 123/71 94/68 107/66  Pulse: 80 81 97 81  Resp: 18 18 18 18   Temp: 98.4 F (36.9 C) 98.1 F (36.7 C) 97.9 F (36.6 C) 97.6 F (36.4 C)  TempSrc: Oral Oral Oral Oral  SpO2:      Weight:      Height:       General: alert and  cooperative Lochia: appropriate Uterine Fundus: firm Incision: N/A DVT Evaluation: No evidence of DVT seen on physical exam. Labs: Lab Results  Component Value Date   WBC 10.6 (H) 07/09/2018   HGB 10.5 (L) 07/09/2018   HCT 33.3 (L) 07/09/2018   MCV 83.0 07/09/2018   PLT 296 07/09/2018   CMP Latest Ref Rng & Units 05/04/2016  Glucose 65 - 99 mg/dL 69    Discharge instruction: per After Visit Summary and "Baby and Me Booklet".  After visit meds:  Allergies as of 07/10/2018   No Known Allergies     Medication List    TAKE these medications   ibuprofen 600 MG tablet Commonly known as:  ADVIL,MOTRIN Take 1 tablet (600 mg total) by mouth every 6 (six) hours.   Prenatal Vitamins 28-0.8 MG Tabs Take 1 tablet by mouth daily.  sertraline 50 MG tablet Commonly known as:  ZOLOFT Take 1 tablet (50 mg total) by mouth daily.       Diet: routine diet  Activity: Advance as tolerated. Pelvic rest for 6 weeks.   Outpatient follow up:4 weeks Follow up Appt:No future appointments. Follow up Visit:   Please schedule this patient for Postpartum visit in: 4 weeks with the following provider: CNM For C/S patients schedule nurse incision check in weeks 2 weeks: NA High risk pregnancy complicated by: Hx neonatal death w/ previous pregnancy Delivery mode:  SVD Anticipated Birth Control:  Vasectomy PP Procedures needed: None  Schedule Integrated BH visit: no      Newborn Data: Live born female  Birth Weight: 7 lb 9 oz (3430 g) APGAR: 8, 9  Newborn Delivery   Birth date/time:  07/09/2018 08:43:00 Delivery type:  VBAC, Spontaneous     Baby Feeding: Breast Disposition:home with mother   07/10/2018 De Hollingshead, DO

## 2018-07-09 NOTE — MAU Note (Signed)
Pt has been having contractions since yesterday, began having stronger contractions tonight, now about 4-5 mins apart. Denies LOF, has bloody show.  +FM.

## 2018-07-09 NOTE — H&P (Signed)
LABOR AND DELIVERY ADMISSION HISTORY AND PHYSICAL NOTE  Robin [name] is a 32 y.o. female [phone] with IUP at [redacted]w[redacted]d by LMP presenting for SOL.  She reports positive fetal movement. She denies leakage of fluid. She report bloody show since last night.   Prenatal History/Complications: PNC at [facility] Pregnancy complications:  - Previous cesarean section, 28 weeks 2/2 abruption, TOLAC consent signed  - Hx of preterm delivery  - Depression, was on Zoloft  - Previous pregnancy with hx of neonatal death   Past Medical History: Past Medical History:  Diagnosis Date  . Abnormal Pap smear 2008   HAD HPV;HAD GENITAL WART REMOVED;LAST PAP 07/2010 WAS NORMAL  . Anemia    FeSO4 SUPP TAKEN IN PAST  . Asthma    AS A CHILD;GREW OUT OF @ 8 YOA  . Depression    after the death of her son, was on meds then  . History of smoking 12/26/2011   Reports quit '12  . Infection    UTI;CAN GET FREQ  . Vaginal Pap smear, abnormal     Past Surgical History: Past Surgical History:  Procedure Laterality Date  . CESAREAN SECTION N/A 09/09/2016   Procedure: CESAREAN SECTION;  Surgeon: Tereso Newcomer, MD;  Location: [facility];  Service: Obstetrics;  Laterality: N/A;  . NASAL SINUS SURGERY  2016    Obstetrical History: OB History    Gravida  6   Para  4   Term  3   Preterm  1   AB  1   Living  3     SAB  1   TAB      Ectopic      Multiple      Live Births  4           Social History: Social History   Socioeconomic History  . Marital status: Married    Spouse name: [place] Kassis  . Number of children: 1  . Years of education: Not on file  . Highest education level: Not on file  Occupational History  . Occupation: SHIFT SUPERVISOR    Employer: [employer]  Social Needs  . Financial resource strain: Not on file  . Food insecurity:    Worry: Not on file    Inability: Not on file  . Transportation needs:    Medical: Not on file    Non-medical: Not on file  Tobacco Use   . Smoking status: Former Smoker    Types: Cigarettes    Last attempt to quit: 06/27/2011    Years since quitting: 7.0  . Smokeless tobacco: Never Used  Substance and Sexual Activity  . Alcohol use: No    Comment: BI-WEEKLY PRIOR TO PREGNANCY  . Drug use: No  . Sexual activity: Yes    Partners: Male    Birth control/protection: None  Lifestyle  . Physical activity:    Days per week: Not on file    Minutes per session: Not on file  . Stress: Not on file  Relationships  . Social connections:    Talks on phone: Not on file    Gets together: Not on file    Attends religious service: Not on file    Active member of club or organization: Not on file    Attends meetings of clubs or organizations: Not on file    Relationship status: Not on file  Other Topics Concern  . Not on file  Social History Narrative  . Not on file  Family History: Family History  Problem Relation Age of Onset  . Mitral valve prolapse Mother        DIED @ 21 YOA OF CONDITION  . Healthy Father   . Mitral valve prolapse Paternal Aunt   . Heart attack Paternal Grandfather        DECEASED  . Dementia Maternal Grandmother     Allergies: No Known Allergies  Medications Prior to Admission  Medication Sig Dispense Refill Last Dose  . Prenatal Vit-Fe Fumarate-FA (PRENATAL VITAMINS) 28-0.8 MG TABS Take 1 tablet by mouth daily.    07/08/2018 at Unknown time  . sertraline (ZOLOFT) 50 MG tablet Take 1 tablet (50 mg total) by mouth daily. 30 tablet 6 07/08/2018 at Unknown time     Review of Systems  All systems reviewed and negative except as stated in HPI  Physical Exam Blood pressure 124/81, pulse 100, temperature 98.4 F (36.9 C), temperature source Oral, resp. rate 18, height 5\' 6"  (1.676 m), weight 61.7 kg, last menstrual period 10/04/2017, SpO2 98 %. General appearance: alert, cooperative and mild distress due to pain  Lungs: clear to auscultation bilaterally Heart: regular rate and  rhythm Abdomen: soft, non-tender; bowel sounds normal Extremities: No calf swelling or tenderness Presentation: cephalic by cervical examination  Fetal monitoring: 140/ moderate/ +accels/ no decelerations  Uterine activity: 2-3/ moderate by palpation  Dilation: 5 Effacement (%): 90 Station: -2 Exam by:: Lanice Shirts CNM  Prenatal labs: ABO, Rh: --/--/O POS (12/16 0335) Antibody: NEG (12/16 0335) Rubella: 1.50 (05/10 1103) RPR: NON-REACTIVE (10/04 0920)  HBsAg: NON-REACTIVE (05/10 1103)  HIV: NON-REACTIVE (10/04 0920)  GC/Chlamydia: negative  GBS: Negative (11/22 0000)  2 hr Glucola: 72-89-110 Genetic screening:  declined Anatomy US: Normal female   Radio producer Dating   LMP/19  Language  English Anatomy US  Nml female  Flu Vaccine  Declined Genetic Screen  Declines   TDaP vaccine   Declined Hgb A1C or  GTT Early  Third trimester 2 hour wnl  Rhogam  NA   LAB RESULTS   Feeding Plan  breast Blood Type   O+  Contraception  Vasectomy Antibody  Neg  Circumcision  n/a Rubella  Immune  Pediatrician   [facility] RPR   NR  Support Person  [place] HBsAg   Neg  Prenatal Classes  HIV  NR  BTL Consent NA GBS  (For PCN allergy, check sensitivities)   VBAC Consent  Pap Wants to defer    Hgb Electro      CF     SMA     Waterbirth  [ ]  Class [ ]  Consent [ ]  CNM visit    Prenatal Transfer Tool  Maternal Diabetes: No Genetic Screening: Declined Maternal Ultrasounds/Referrals: Normal Fetal Ultrasounds or other Referrals:  None Maternal Substance Abuse:  No Significant Maternal Medications:  None Significant Maternal Lab Results: Lab values include: Group B Strep negative  Results for orders placed or performed during the hospital encounter of 07/09/18 (from the past 24 hour(s))  CBC   Collection Time: 07/09/18  3:35 AM  Result Value Ref Range   WBC 10.6 (H) 4.0 - 10.5 K/uL   RBC 4.01 3.87 - 5.11 MIL/uL   Hemoglobin 10.5 (L) 12.0 - 15.0 g/dL   HCT  78.2 (L) 95.6 - 46.0 %   MCV 83.0 80.0 - 100.0 fL   MCH 26.2 26.0 - 34.0 pg   MCHC 31.5 30.0 - 36.0 g/dL   RDW  13.9 11.5 - 15.5 %   Platelets 296 150 - 400 K/uL   nRBC 0.0 0.0 - 0.2 %  Type and screen [facility]   Collection Time: 07/09/18  3:35 AM  Result Value Ref Range   ABO/RH(D) O POS    Antibody Screen NEG    Sample Expiration      07/12/2018 Performed at [facility], [address] [place], [place] [address]     Patient Active Problem List   Diagnosis Date Noted  . Normal labor 07/09/2018  . Pregnancy with history of neonatal death 04/27/2018  . Depression affecting pregnancy 04/02/2018  . Irregular periods 01/26/2018  . Hereditary disease in family possibly affecting fetus, affecting management of mother, antepartum condition or complication, not applicable or unspecified fetus   . History of preterm delivery 11/17/2017  . Supervision of high risk pregnancy, antepartum 11/17/2017  . Previous cesarean section 11/17/2017  . Family history of congenital anomaly 11/17/2017  . Family history of congenital heart disease 07/27/2015  . Family hx of bicuspid aortic valve--FOB 04/03/2012    Assessment: Robin [name] is a 32 y.o. [id number] at 6379w5d here for SOL  #Labor: Progressing well, expectant management  #Pain: IV pain medication  #FWB: Cat I  #ID:  GBS negative  #MOF: Breast  #MOC: Vasectomy  #Circ:  n/a  Sharyon Cableogers, Safari Cinque C, CNM, 07/09/2018, 3:45 AM

## 2018-07-09 NOTE — Anesthesia Pain Management Evaluation Note (Signed)
  CRNA Pain Management Visit Note  Patient: Robin [name] C Schoon, 32 y.o., female  "Hello I am a [facility] the [facility]. We have an anesthesia team available at all times to provide care throughout the hospital, including epidural management and anesthesia for C-section. I don't know your plan for the delivery whether it a natural birth, water birth, IV sedation, nitrous supplementation, doula or epidural, but we want to meet your pain goals."   1.Was your pain managed to your expectations on prior hospitalizations?   Prior natural deliveries per pt plan according to RN  2.What is your expectation for pain management during this hospitalization?     Labor support without medications  3.How can we help you reach that goal? Support PRN  Record the patient's initial score and the patient's pain goal.   Pain: 10  Pain Goal: 10 [facility] wants you to be able to say your pain was always managed very well.  Jennelle HumanLisa B Savayah Waltrip 07/09/2018

## 2018-07-10 MED ORDER — IBUPROFEN 600 MG PO TABS: 600.0000 mg | ORAL_TABLET | Freq: Four times a day (QID) | ORAL | 1 refills | Status: DC

## 2018-07-10 NOTE — Progress Notes (Signed)
POSTPARTUM PROGRESS NOTE  Post Partum Day 1  Subjective:  Robin [name] is a 32 y.o. [id number] s/p VBAC at 5930w5d.  She reports she is doing well. No acute events overnight. She denies any problems with ambulating, voiding or po intake. Denies nausea or vomiting.  Pain is well controlled.  Lochia is mild. Passing flatus.   Objective: Blood pressure 107/66, pulse 81, temperature 97.6 F (36.4 C), temperature source Oral, resp. rate 18, height 5\' 6"  (1.676 m), weight 61.7 kg, last menstrual period 10/04/2017, SpO2 96 %, unknown if currently breastfeeding.  Physical Exam:  General: alert, cooperative and no distress Chest: no respiratory distress Heart:regular rate, distal pulses intact Abdomen: soft, nontender Uterine Fundus: firm, appropriately tender DVT Evaluation: No calf swelling or tenderness Extremities: No edema Skin: warm, dry  Recent Labs    07/09/18 0335  HGB 10.5*  HCT 33.3*    Assessment/Plan: Robin CarolLindsay C Soots is a 32 y.o. [id number] s/p VBAC at 7730w5d   PPD#1 - Doing well Routine postpartum care Contraception: Vasectomy Feeding: Breast Dispo: Plan for discharge possibly today.   LOS: 1 day   [facility] HospitalElizabeth Franki Stemen MS3 Marcy Sirenatherine Wallace, D.O. [place] Fellow  07/10/2018, 7:27 AM

## 2018-07-10 NOTE — Progress Notes (Signed)
CSW acknowledged consult of maternal hx of depression after death of son. CSW spoke with MOB's bedside RN who reported no concerns, RN agreed to ask MOB if she wanted to speak with CSW.   CSW spoke with MOB's bedside RN who reported that MOB is not interested in speaking with CSW regarding hx of depression after death of son. MOB reported that she is fine and is stilling taking Zoloft.  CSW signing off, MOB is not interested in speaking with CSW and is taking Zoloft for depression. No barriers to discharge at this time.  Bria Sparr, LCSWA Clinical Social Worker [facility] Cell#: [phone]  

## 2018-07-10 NOTE — Discharge Instructions (Signed)
Postpartum Care After Vaginal Delivery °The period of time right after you deliver your newborn is called the postpartum period. °What kind of medical care will I receive? °· You may continue to receive fluids and medicines through an IV tube inserted into one of your veins. °· If an incision was made near your vagina (episiotomy) or if you had some vaginal tearing during delivery, cold compresses may be placed on your episiotomy or your tear. This helps to reduce pain and swelling. °· You may be given a squirt bottle to use when you go to the bathroom. You may use this until you are comfortable wiping as usual. To use the squirt bottle, follow these steps: °? Before you urinate, fill the squirt bottle with warm water. Do not use hot water. °? After you urinate, while you are sitting on the toilet, use the squirt bottle to rinse the area around your urethra and vaginal opening. This rinses away any urine and blood. °? You may do this instead of wiping. As you start healing, you may use the squirt bottle before wiping yourself. Make sure to wipe gently. °? Fill the squirt bottle with clean water every time you use the bathroom. °· You will be given sanitary pads to wear. °How can I expect to feel? °· You may not feel the need to urinate for several hours after delivery. °· You will have some soreness and pain in your abdomen and vagina. °· If you are breastfeeding, you may have uterine contractions every time you breastfeed for up to several weeks postpartum. Uterine contractions help your uterus return to its normal size. °· It is normal to have vaginal bleeding (lochia) after delivery. The amount and appearance of lochia is often similar to a menstrual period in the first week after delivery. It will gradually decrease over the next few weeks to a dry, yellow-brown discharge. For most women, lochia stops completely by 6-8 weeks after delivery. Vaginal bleeding can vary from woman to woman. °· Within the first few  days after delivery, you may have breast engorgement. This is when your breasts feel heavy, full, and uncomfortable. Your breasts may also throb and feel hard, tightly stretched, warm, and tender. After this occurs, you may have milk leaking from your breasts. Your health care provider can help you relieve discomfort due to breast engorgement. Breast engorgement should go away within a few days. °· You may feel more sad or worried than normal due to hormonal changes after delivery. These feelings should not last more than a few days. If these feelings do not go away after several days, speak with your health care provider. °How should I care for myself? °· Tell your health care provider if you have pain or discomfort. °· Drink enough water to keep your urine clear or pale yellow. °· Wash your hands thoroughly with soap and water for at least 20 seconds after changing your sanitary pads, after using the toilet, and before holding or feeding your baby. °· If you are not breastfeeding, avoid touching your breasts a lot. Doing this can make your breasts produce more milk. °· If you become weak or lightheaded, or you feel like you might faint, ask for help before: °? Getting out of bed. °? Showering. °· Change your sanitary pads frequently. Watch for any changes in your flow, such as a sudden increase in volume, a change in color, the passing of large blood clots. If you pass a blood clot from your vagina, save it   to show to your health care provider. Do not flush blood clots down the toilet without having your health care provider look at them. °· Make sure that all your vaccinations are up to date. This can help protect you and your baby from getting certain diseases. You may need to have immunizations done before you leave the hospital. °· If desired, talk with your health care provider about methods of family planning or birth control (contraception). °How can I start bonding with my baby? °Spending as much time as  possible with your baby is very important. During this time, you and your baby can get to know each other and develop a bond. Having your baby stay with you in your room (rooming in) can give you time to get to know your baby. Rooming in can also help you become comfortable caring for your baby. Breastfeeding can also help you bond with your baby. °How can I plan for returning home with my baby? °· Make sure that you have a car seat installed in your vehicle. °? Your car seat should be checked by a certified car seat installer to make sure that it is installed safely. °? Make sure that your baby fits into the car seat safely. °· Ask your health care provider any questions you have about caring for yourself or your baby. Make sure that you are able to contact your health care provider with any questions after leaving the hospital. °This information is not intended to replace advice given to you by your health care provider. Make sure you discuss any questions you have with your health care provider. °Document Released: 05/08/2007 Document Revised: 12/14/2015 Document Reviewed: 06/15/2015 °Elsevier Interactive Patient Education © 2018 [employer]vier Inc. ° °

## 2018-07-11 ENCOUNTER — Telehealth: Payer: Self-pay | Admitting: *Deleted

## 2018-07-11 NOTE — Telephone Encounter (Signed)
Left patient a message to call and schedule 6 week Postpartum appointment around the week of August 20, 2018 or send a MyChart message with a good day and time.

## 2018-07-13 ENCOUNTER — Encounter: Payer: Self-pay | Admitting: Certified Nurse Midwife

## 2018-08-24 ENCOUNTER — Other Ambulatory Visit (HOSPITAL_COMMUNITY)
Admission: RE | Admit: 2018-08-24 | Discharge: 2018-08-24 | Disposition: A | Discharge status: Home / Self Care | Payer: 59 | Source: Ambulatory Visit

## 2018-08-24 ENCOUNTER — Ambulatory Visit (INDEPENDENT_AMBULATORY_CARE_PROVIDER_SITE_OTHER): Payer: 59

## 2018-08-24 MED ORDER — SERTRALINE HCL 50 MG PO TABS: 75.0000 mg | ORAL_TABLET | Freq: Every day | ORAL | 6 refills | Status: DC

## 2018-08-24 NOTE — Progress Notes (Signed)
Post Partum Exam  Robin [name] is a 33 y.o. [id number] female who presents for a postpartum visit. She is 6 weeks postpartum following a spontaneous vaginal delivery with 1 degree perineal tear.I have fully reviewed the prenatal and intrapartum course. The delivery was at [redacted]w[redacted]d gestational weeks.  Anesthesia: none. Postpartum course has been unremarkable . Baby's course has been uneventful . Baby is feeding by breast. Bleeding no bleeding. Bowel function is normal. Bladder function is normal. Patient is not sexually active. Contraception method is husband had vasectomy. Postpartum depression screening:neg  The following portions of the patient's history were reviewed and updated as appropriate: allergies, current medications, past family history, past medical history, past social history, past surgical history and problem list. Last pap smear done 2017 and was Normal  Review of Systems Pertinent items noted in HPI and remainder of comprehensive ROS otherwise negative.    Objective:  unknown if currently breastfeeding.  General:  alert, cooperative and no distress   Breasts:  inspection negative, no nipple discharge or bleeding, no masses or nodularity palpable  Lungs: clear to auscultation bilaterally  Heart:  regular rate and rhythm, S1, S2 normal, no murmur, click, rub or gallop  Abdomen: soft, non-tender; bowel sounds normal; no masses,  no organomegaly   Vulva:  normal  Vagina: normal vagina  Cervix:  multiparous appearance  Corpus: normal size, contour, position, consistency, mobility, non-tender  Adnexa:  normal adnexa  Rectal Exam: Not performed.        Assessment:    Normal postpartum exam. Pap smear not done at today's visit.   Patient reporting increased anxiety. Discussed with patient increasing zoloft and resources given for postpartum counseling.   Plan:   1. Contraception: Vasectomy 2. Follow up in: as needed. Patient will call if increasing Zoloft does not help with  symptoms.  Rolm Bookbinder, CNM 08/24/18 11:04 AM

## 2018-08-27 LAB — CYTOLOGY - PAP
Diagnosis: NEGATIVE
HPV: NOT DETECTED

## 2018-09-27 ENCOUNTER — Telehealth: Payer: Self-pay

## 2018-09-27 DIAGNOSIS — Z9189 Other specified personal risk factors, not elsewhere classified: Secondary | ICD-10-CM

## 2018-09-27 NOTE — Telephone Encounter (Signed)
Pt had called earlier requesting Reglan. Dr.Dove said this was okay to send in. I called pt to verify pharmacy and she decided she did not want Rx anymore.

## 2018-11-20 IMAGING — US US MFM OB FOLLOW-UP
1 series · 14 of 28 positions shown · non-contrast
Comparison: none

[Series 1: us mfm ob follow-up · 51 acquisitions, 14 frames shown]
[im 2/51]
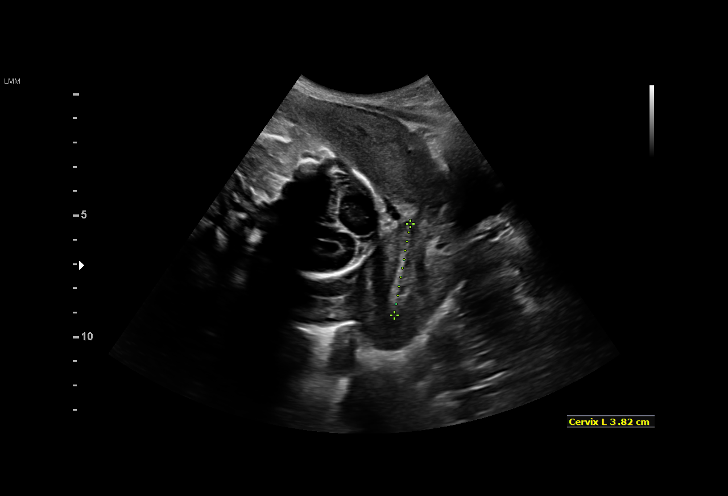
[im 6/51]
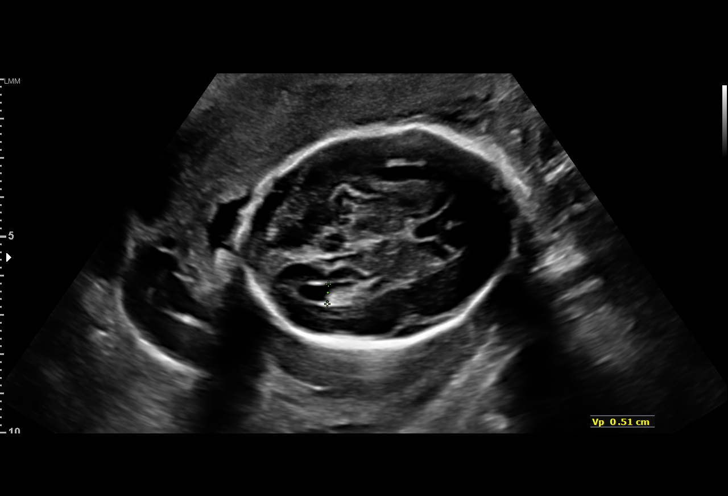
[im 10/51]
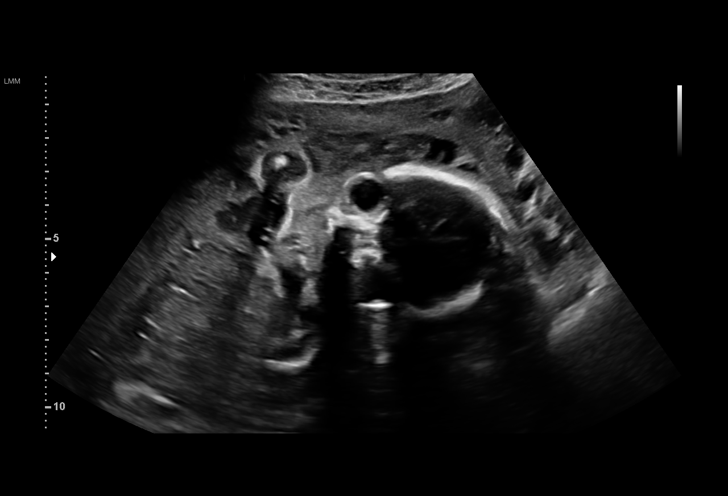
[im 13/51]
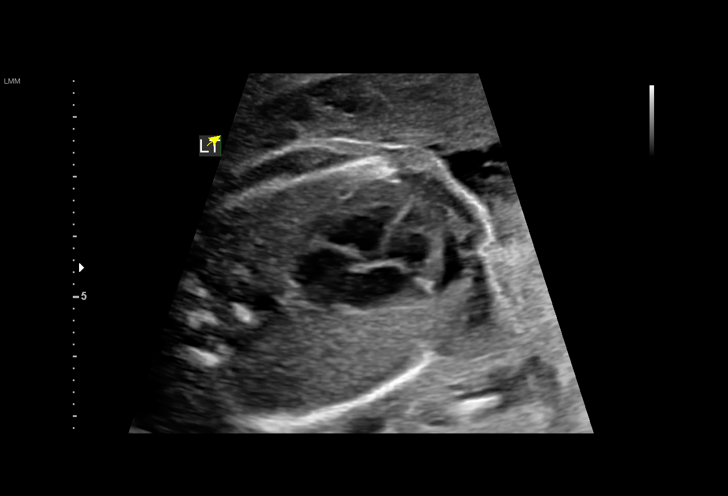
[im 17/51]
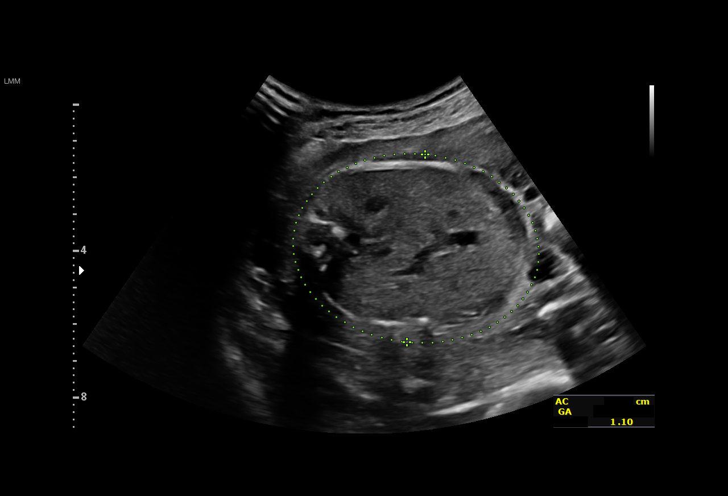
[im 21/51]
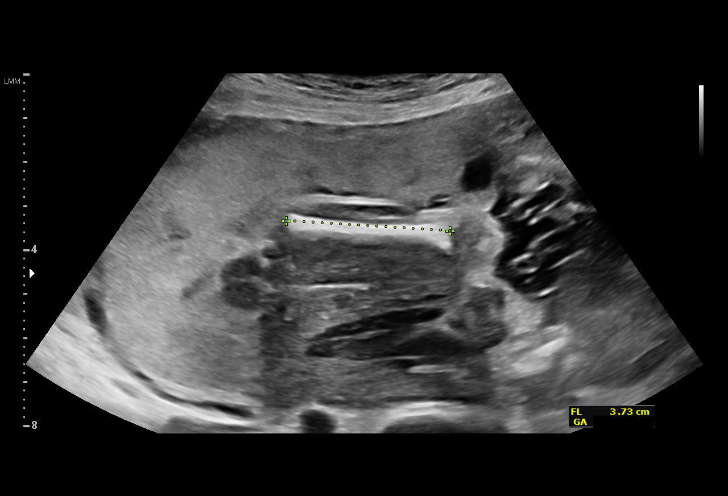
[im 25/51]
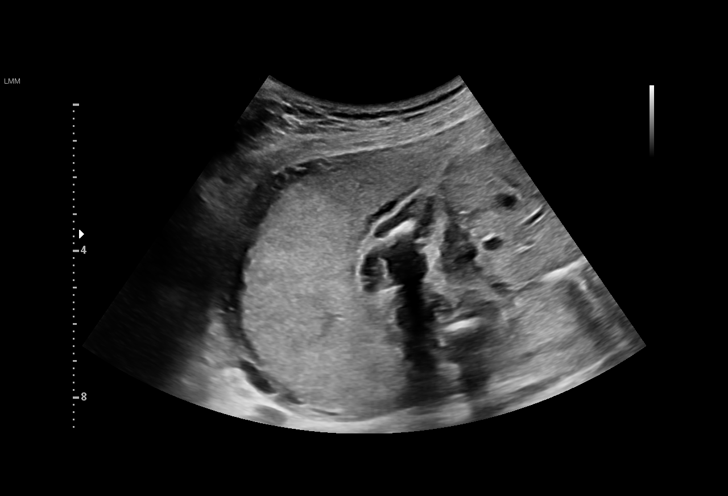
[im 28/51]
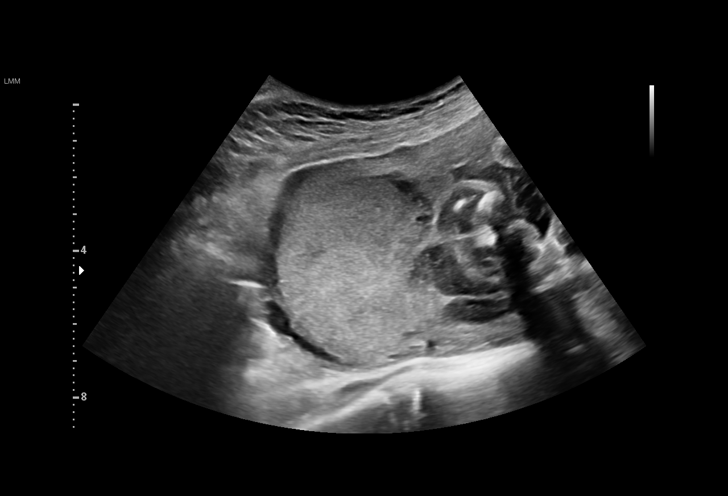
[im 32/51]
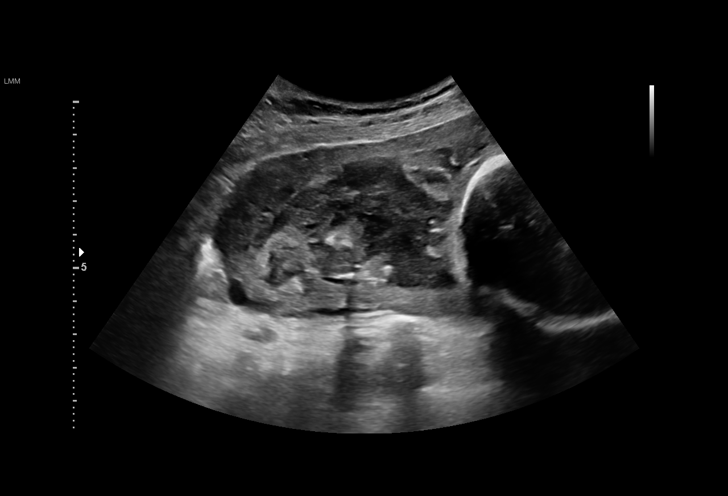
[im 36/51]
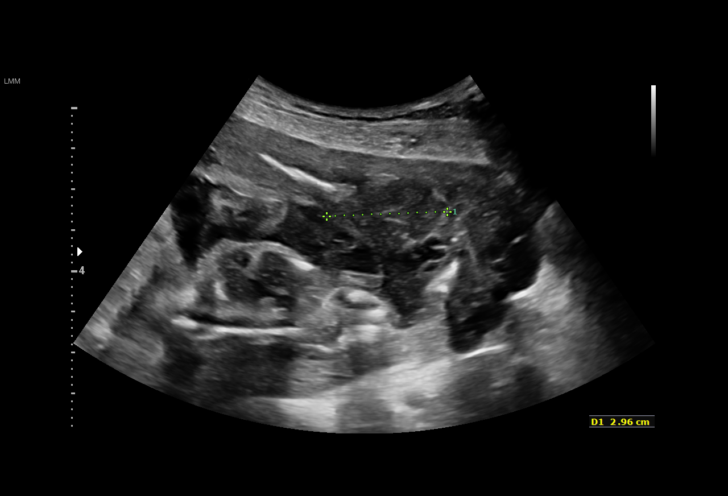
[im 39/51]
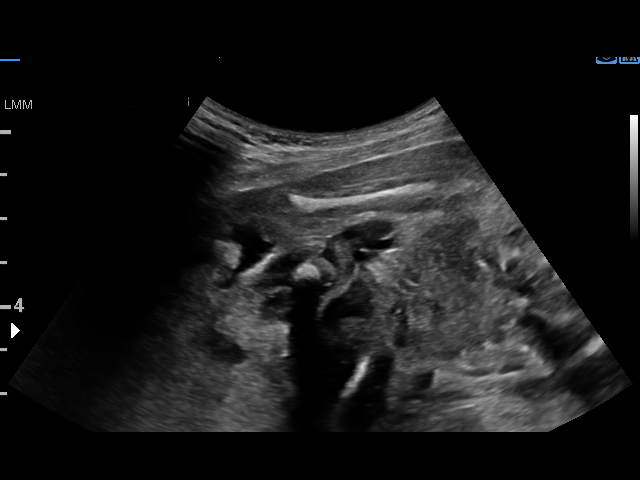
[im 43/51]
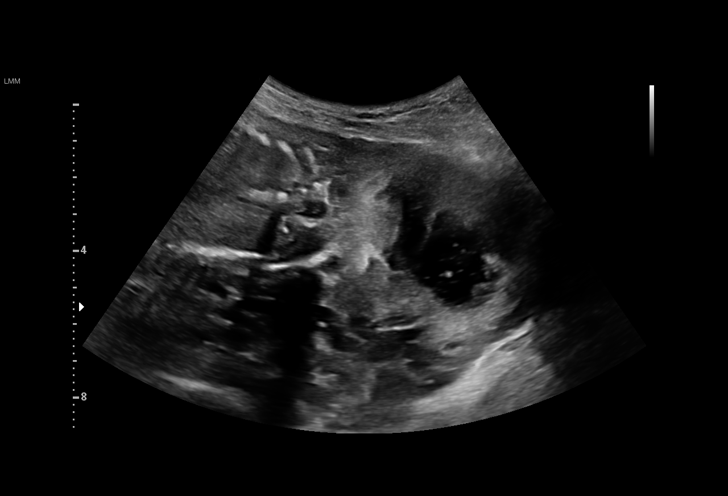
[im 47/51]
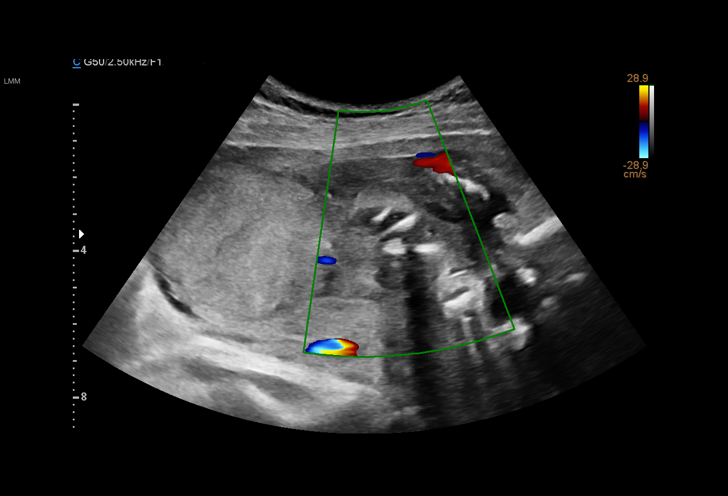
[im 51/51]
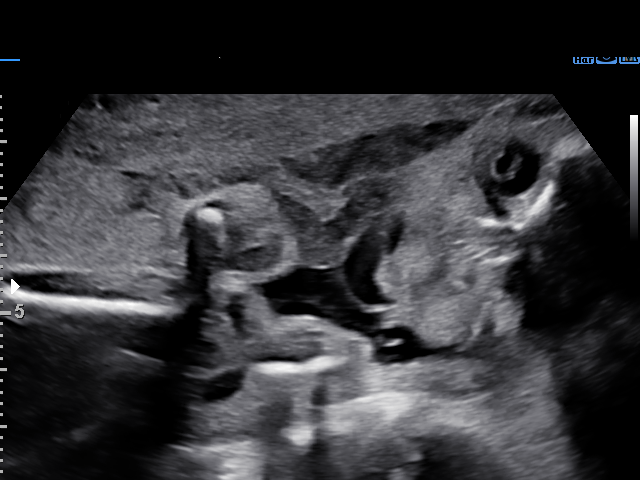

[14 of 28 positions shown; findings below may reference images not displayed]

Attending:        Celikovic Mty       Secondary Phy.:   3rd Nursing- 3rd
floor [phone]

1  DEINIS ELANE            [id number]      [phone]     [phone]
Indications

23 weeks gestation of pregnancy
Subchorionic hemorrhage, antepartum
Premature rupture of membranes - leaking
fluid
Encounter for other antenatal screening
follow-up
OB History

Gravidity:    5         Term:   3         SAB:   1
Living:       3
Fetal Evaluation

Num Of Fetuses:     1
Fetal Heart         164
Rate(bpm):
Cardiac Activity:   Observed
Presentation:       Cephalic
Placenta:           Fundal, above cervical os
P. Cord Insertion:  Visualized, central

Amniotic Fluid
AFI FV:      Subjectively low-normal

AFI Sum(cm)     %Tile       Largest Pocket(cm)
2.9             < 3

RLQ(cm)       LUQ(cm)
0.85
Biometry

BPD:      53.4  mm     G. Age:  22w 2d         15  %    CI:        67.52   %    70 - 86
FL/HC:      18.2   %    19.2 -
HC:       208   mm     G. Age:  22w 6d         25  %    HC/AC:      1.12        1.05 -
AC:      185.2  mm     G. Age:  23w 2d         47  %    FL/BPD:     71.0   %    71 - 87
FL:       37.9  mm     G. Age:  22w 1d         12  %    FL/AC:      20.5   %    20 - 24
HUM:      34.7  mm     G. Age:  21w 6d         17  %

Est. FW:     533  gm      1 lb 3 oz     46  %
Gestational Age

U/S Today:     22w 5d                                        EDD:   12/04/16
Best:          23w 1d     Det. By:  Early Ultrasound         EDD:   12/01/16
(04/23/16)
Anatomy

Cranium:               Appears normal         Aortic Arch:            Appears normal
Cavum:                 Appears normal         Ductal Arch:            Not well visualized
Ventricles:            Appears normal         Diaphragm:              Previously seen
Choroid Plexus:        Previously seen        Stomach:                Appears normal, left
sided
Cerebellum:            Previously seen        Abdomen:                Appears normal
Posterior Fossa:       Previously seen        Abdominal Wall:         Appears nml (cord
insert, abd wall)
Nuchal Fold:           Previously seen        Cord Vessels:           Previously seen
Face:                  Orbits previously      Kidneys:                Appear normal
seen
Lips:                  Previously seen        Bladder:                Appears normal
Thoracic:              Appears normal         Spine:                  Not well visualized
Heart:                 Appears normal         Upper Extremities:      Previously seen
(4CH, axis, and situs
RVOT:                  Previously seen        Lower Extremities:      Previously seen
LVOT:                  Previously seen

Other:  Technically difficult due to fetal position and decreased amniotic fluid.
Cervix Uterus Adnexa

Cervix
Length:           3.82  cm.
Normal appearance by transabdominal scan.

Uterus
No abnormality visualized.

Left Ovary
Not visualized.

Right Ovary
Not visualized.

Cul De Sac:   No free fluid seen.
Impression

Singleton intrauterine pregnancy 23 [DATE] weeks with pPROM
on follow up songographic evaluation:
?active singleton fetus
?today's biometry demonstrates appropriate interval growth
with EFW at the 46th%'le
?no structural defects or markers of aneuploidy demonstrated
?limitations as documented above owing to insonating
characteristics
?Amniotic fluid volume is subjectively low normal for
gestational age by maximum vertical pocket
placenta is implanted along the uterine fundus without previa
Recommendations

Interval growth in 3 weeks
Follow up in the interim as clinically indicated for routine
management of pPROM by OB provider

## 2019-01-14 DIAGNOSIS — M0579 Rheumatoid arthritis with rheumatoid factor of multiple sites without organ or systems involvement: Secondary | ICD-10-CM | POA: Diagnosis not present

## 2019-01-14 DIAGNOSIS — M79671 Pain in right foot: Secondary | ICD-10-CM | POA: Diagnosis not present

## 2019-01-14 DIAGNOSIS — M79641 Pain in right hand: Secondary | ICD-10-CM | POA: Diagnosis not present

## 2019-01-14 DIAGNOSIS — M79672 Pain in left foot: Secondary | ICD-10-CM | POA: Diagnosis not present

## 2019-01-14 DIAGNOSIS — M79642 Pain in left hand: Secondary | ICD-10-CM | POA: Diagnosis not present

## 2019-01-14 LAB — HCV ANTIBODY: HCV Ab: NONREACTIVE

## 2019-05-21 DIAGNOSIS — M0579 Rheumatoid arthritis with rheumatoid factor of multiple sites without organ or systems involvement: Secondary | ICD-10-CM | POA: Diagnosis not present

## 2019-06-04 DIAGNOSIS — M0579 Rheumatoid arthritis with rheumatoid factor of multiple sites without organ or systems involvement: Secondary | ICD-10-CM | POA: Diagnosis not present

## 2019-06-04 DIAGNOSIS — M25571 Pain in right ankle and joints of right foot: Secondary | ICD-10-CM | POA: Diagnosis not present

## 2019-06-04 DIAGNOSIS — Z23 Encounter for immunization: Secondary | ICD-10-CM | POA: Diagnosis not present

## 2019-06-04 DIAGNOSIS — M659 Synovitis and tenosynovitis, unspecified: Secondary | ICD-10-CM | POA: Diagnosis not present

## 2019-09-27 ENCOUNTER — Other Ambulatory Visit: Payer: Self-pay

## 2019-09-27 ENCOUNTER — Emergency Department (INDEPENDENT_AMBULATORY_CARE_PROVIDER_SITE_OTHER)
Admission: EM | Admit: 2019-09-27 | Discharge: 2019-09-27 | Disposition: A | Discharge status: Home / Self Care | Payer: 59 | Source: Home / Self Care

## 2019-09-27 DIAGNOSIS — S0501XA Injury of conjunctiva and corneal abrasion without foreign body, right eye, initial encounter: Secondary | ICD-10-CM | POA: Diagnosis not present

## 2019-09-27 MED ORDER — POLYMYXIN B-TRIMETHOPRIM 10000-0.1 UNIT/ML-% OP SOLN: 1.0000 [drp] | OPHTHALMIC | 0 refills | Status: AC

## 2019-09-27 NOTE — ED Triage Notes (Signed)
Patient presents to Urgent Care with complaints of right eye irritation and redness since a few days ago. Patient reports she thinks she may have pink eye but has family coming to town soon so wants to make sure it is treated and not contagious. Patient states her vision is not affected but it does sometimes feel like there is dirt in her eye when she blinks; direct sunlight is unpleasant.

## 2019-09-27 NOTE — Discharge Instructions (Signed)
  Please use the antibiotic drops as prescribed to help prevent infection.   Please follow up with family medicine or an eye specialist if not improving in 3-4 days, follow up sooner if symptoms worsening.

## 2019-09-27 NOTE — ED Provider Notes (Addendum)
[facility]    CSN: [id number] Arrival date & time: 09/27/19  1023      History   Chief Complaint Chief Complaint  Patient presents with  . Eye Problem    HPI Robin [name] is a 34 y.o. female.   HPI Robin [name] is a 34 y.o. female presenting to UC with c/o Right eye irritation, redness and watering that started a few days ago after taking a bath.  No known trauma to her eye but it feels like she has something in her eye at times. She has tried OTC Stye drops due to hx of styes but she has not noticed a stye develop. Her eye waters but no purulent drainage. Denies eye pain or change in vision. Symptoms come and go.  Denies nasal congestion, cough, sore throat or ear pain.  She does not wear glasses or contacts. She has 5 children, some currently in school but none are sick. Her elderly mother is coming this afternoon and she wants to make sure she does not have contagious pink eye.    Past Medical History:  Diagnosis Date  . Abnormal Pap smear 2008   HAD HPV;HAD GENITAL WART REMOVED;LAST PAP 07/2010 WAS NORMAL  . Anemia    FeSO4 SUPP TAKEN IN PAST  . Asthma    AS A CHILD;GREW OUT OF @ 8 YOA  . Depression    after the death of her son, was on meds then  . History of smoking 12/26/2011   Reports quit '12  . Infection    UTI;CAN GET FREQ  . Vaginal Pap smear, abnormal     Patient Active Problem List   Diagnosis Date Noted  . Normal labor 07/09/2018  . Pregnancy with history of neonatal death 2018-05-20  . Depression affecting pregnancy 04/02/2018  . Irregular periods 01/26/2018  . Hereditary disease in family possibly affecting fetus, affecting management of mother, antepartum condition or complication, not applicable or unspecified fetus   . History of preterm delivery 11/17/2017  . Supervision of high risk pregnancy, antepartum 11/17/2017  . Previous cesarean section 11/17/2017  . Family history of congenital anomaly 11/17/2017  . Family history of  congenital heart disease 07/27/2015  . Family hx of bicuspid aortic valve--FOB 04/03/2012    Past Surgical History:  Procedure Laterality Date  . CESAREAN SECTION N/A 09/09/2016   Procedure: CESAREAN SECTION;  Surgeon: Tereso Newcomer, MD;  Location: [facility];  Service: Obstetrics;  Laterality: N/A;  . NASAL SINUS SURGERY  2016    OB History    Gravida  6   Para  5   Term  4   Preterm  1   AB  1   Living  4     SAB  1   TAB      Ectopic      Multiple  0   Live Births  5            Home Medications    Prior to Admission medications   Medication Sig Start Date End Date Taking? Authorizing Provider  ibuprofen (ADVIL,MOTRIN) 600 MG tablet Take 1 tablet (600 mg total) by mouth every 6 (six) hours. 07/10/18   Arvilla Market, DO  Methotrexate Sodium (METHOTREXATE, PF,) 50 MG/2ML injection Inject 0.8 mLs (20 mg total) subcutaneously every 7 (seven) days for 90 days 08/30/19   [provider]  Prenatal Vit-Fe Fumarate-FA (PRENATAL VITAMINS) 28-0.8 MG TABS Take 1 tablet by mouth daily.  04/23/16  [provider]  sertraline (ZOLOFT) 50 MG tablet Take 1.5 tablets (75 mg total) by mouth daily. 08/24/18   Wende Mott, CNM  trimethoprim-polymyxin b (POLYTRIM) ophthalmic solution Place 1 drop into the right eye every 4 (four) hours for 5 days. 09/27/19 10/02/19  Noe Gens, PA-C    Family History Family History  Problem Relation Age of Onset  . Mitral valve prolapse Mother        DIED @ 88 YOA OF CONDITION  . Healthy Father   . Mitral valve prolapse Paternal Aunt   . Heart attack Paternal Grandfather        DECEASED  . Dementia Maternal Grandmother     Social History Social History   Tobacco Use  . Smoking status: Former Smoker    Types: Cigarettes    Quit date: 06/27/2011    Years since quitting: 8.2  . Smokeless tobacco: Never Used  Substance Use Topics  . Alcohol use: No    Comment: occ  . Drug use: No      Allergies   Patient has no known allergies.   Review of Systems Review of Systems  HENT: Negative for congestion and rhinorrhea.   Eyes: Positive for discharge ( watery) and redness. Negative for photophobia, pain, itching and visual disturbance.     Physical Exam Triage Vital Signs ED Triage Vitals  Enc Vitals Group     BP 09/27/19 1043 109/70     Pulse Rate 09/27/19 1043 91     Resp 09/27/19 1043 16     Temp 09/27/19 1043 98.2 F (36.8 C)     Temp Source 09/27/19 1043 Oral     SpO2 09/27/19 1043 100 %     Weight 09/27/19 1039 110 lb (49.9 kg)     Height 09/27/19 1039 5\' 6"  (1.676 m)     Head Circumference --      Peak Flow --      Pain Score 09/27/19 1039 0     Pain Loc --      Pain Edu? --      Excl. in [place]? --    No data found.  Updated Vital Signs BP 109/70 (BP Location: Right Arm)   Pulse 91   Temp 98.2 F (36.8 C) (Oral)   Resp 16   Ht 5\' 6"  (1.676 m)   Wt 110 lb (49.9 kg)   SpO2 100%   Breastfeeding No   BMI 17.75 kg/m   Visual Acuity- without correction Right Eye Distance: 20/20 Left Eye Distance: 20/20 Bilateral Distance: 20/15  Right Eye Near:   Left Eye Near:    Bilateral Near:     Physical Exam Vitals and nursing note reviewed.  Constitutional:      Appearance: Normal appearance. She is well-developed.  HENT:     Head: Normocephalic and atraumatic.     Right Ear: Tympanic membrane and ear canal normal.     Left Ear: Tympanic membrane and ear canal normal.     Nose: Nose normal.     Mouth/Throat:     Mouth: Mucous membranes are moist.     Pharynx: Oropharynx is clear.  Eyes:     General: Lids are normal. Lids are everted, no foreign bodies appreciated. Vision grossly intact.        Right eye: No foreign body, discharge or hordeolum.     Extraocular Movements: Extraocular movements intact.     Conjunctiva/sclera: Conjunctivae normal.   Cardiovascular:     Rate  and Rhythm: Normal rate.  Pulmonary:     Effort: Pulmonary  effort is normal.  Musculoskeletal:        General: Normal range of motion.     Cervical back: Normal range of motion.  Skin:    General: Skin is warm and dry.  Neurological:     Mental Status: She is alert and oriented to person, place, and time.  Psychiatric:        Behavior: Behavior normal.      UC Treatments / Results  Labs (all labs ordered are listed, but only abnormal results are displayed) Labs Reviewed - No data to display  EKG   Radiology No results found.  Procedures Procedures (including critical care time)  Medications Ordered in UC Medications - No data to display  Initial Impression / Assessment and Plan / UC Course  I have reviewed the triage vital signs and the nursing notes.  Pertinent labs & imaging results that were available during my care of the patient were reviewed by me and considered in my medical decision making (see chart for details).     Hx and exam c/w corneal abrasion Last tetanus: over 10 years ago. Pt declined to update today.  Antibiotic drops and AVS provided  Final Clinical Impressions(s) / UC Diagnoses   Final diagnoses:  Right corneal abrasion, initial encounter     Discharge Instructions      Please use the antibiotic drops as prescribed to help prevent infection.   Please follow up with family medicine or an eye specialist if not improving in 3-4 days, follow up sooner if symptoms worsening.     ED Prescriptions    Medication Sig Dispense Auth. Provider   trimethoprim-polymyxin b (POLYTRIM) ophthalmic solution Place 1 drop into the right eye every 4 (four) hours for 5 days. 10 mL Lurene Shadow, PA-C     PDMP not reviewed this encounter.   Lurene Shadow, PA-C 09/27/19 1134    [address] [place] 09/27/19 1138

## 2019-10-22 ENCOUNTER — Other Ambulatory Visit: Payer: Self-pay

## 2019-10-22 ENCOUNTER — Encounter: Payer: Self-pay | Admitting: *Deleted

## 2019-10-22 ENCOUNTER — Emergency Department (INDEPENDENT_AMBULATORY_CARE_PROVIDER_SITE_OTHER)
Admission: EM | Admit: 2019-10-22 | Discharge: 2019-10-22 | Disposition: A | Discharge status: Home / Self Care | Payer: 59 | Source: Home / Self Care

## 2019-10-22 DIAGNOSIS — R300 Dysuria: Secondary | ICD-10-CM

## 2019-10-22 DIAGNOSIS — N3001 Acute cystitis with hematuria: Secondary | ICD-10-CM

## 2019-10-22 MED ORDER — CEPHALEXIN 500 MG PO CAPS: 500.0000 mg | ORAL_CAPSULE | Freq: Two times a day (BID) | ORAL | 0 refills | Status: DC

## 2019-10-22 NOTE — ED Provider Notes (Signed)
[facility]    CSN: [id number] Arrival date & time: 10/22/19  0906      History   Chief Complaint Chief Complaint  Patient presents with  . Dysuria    HPI Robin [name] is a 34 y.o. female.   HPI Robin [name] is a 34 y.o. female presenting to UC with c/o sudden onset, worsening burning with urination, bladder pain and pressure and hematuria that started 2 days ago. Last UTI was several years ago. She tried some OTC cranberry tabs yesterday but no relief. Denies fever, n/v/d but has had some chills. No back pain. No hx of kidney stones.    Past Medical History:  Diagnosis Date  . Abnormal Pap smear 2008   HAD HPV;HAD GENITAL WART REMOVED;LAST PAP 07/2010 WAS NORMAL  . Anemia    FeSO4 SUPP TAKEN IN PAST  . Asthma    AS A CHILD;GREW OUT OF @ 8 YOA  . Depression    after the death of her son, was on meds then  . History of smoking 12/26/2011   Reports quit '12  . Infection    UTI;CAN GET FREQ  . Vaginal Pap smear, abnormal     Patient Active Problem List   Diagnosis Date Noted  . Normal labor 07/09/2018  . Pregnancy with history of neonatal death May 12, 2018  . Depression affecting pregnancy 04/02/2018  . Irregular periods 01/26/2018  . Hereditary disease in family possibly affecting fetus, affecting management of mother, antepartum condition or complication, not applicable or unspecified fetus   . History of preterm delivery 11/17/2017  . Supervision of high risk pregnancy, antepartum 11/17/2017  . Previous cesarean section 11/17/2017  . Family history of congenital anomaly 11/17/2017  . Family history of congenital heart disease 07/27/2015  . Family hx of bicuspid aortic valve--FOB 04/03/2012    Past Surgical History:  Procedure Laterality Date  . CESAREAN SECTION N/A 09/09/2016   Procedure: CESAREAN SECTION;  Surgeon: Tereso Newcomer, MD;  Location: [facility];  Service: Obstetrics;  Laterality: N/A;  . NASAL SINUS SURGERY  2016    OB  History    Gravida  6   Para  5   Term  4   Preterm  1   AB  1   Living  4     SAB  1   TAB      Ectopic      Multiple  0   Live Births  5            Home Medications    Prior to Admission medications   Medication Sig Start Date End Date Taking? Authorizing Provider  cephALEXin (KEFLEX) 500 MG capsule Take 1 capsule (500 mg total) by mouth 2 (two) times daily. 10/22/19   Lurene Shadow, PA-C  ibuprofen (ADVIL,MOTRIN) 600 MG tablet Take 1 tablet (600 mg total) by mouth every 6 (six) hours. 07/10/18   Arvilla Market, DO  Methotrexate Sodium (METHOTREXATE, PF,) 50 MG/2ML injection Inject 0.8 mLs (20 mg total) subcutaneously every 7 (seven) days for 90 days 08/30/19   [provider]  Prenatal Vit-Fe Fumarate-FA (PRENATAL VITAMINS) 28-0.8 MG TABS Take 1 tablet by mouth daily.  04/23/16   [provider]    Family History Family History  Problem Relation Age of Onset  . Mitral valve prolapse Mother        DIED @ 86 YOA OF CONDITION  . Healthy Father   . Mitral valve prolapse Paternal Aunt   .  Heart attack Paternal Grandfather        DECEASED  . Dementia Maternal Grandmother     Social History Social History   Tobacco Use  . Smoking status: Former Smoker    Types: Cigarettes    Quit date: 06/27/2011    Years since quitting: 8.3  . Smokeless tobacco: Never Used  Substance Use Topics  . Alcohol use: No    Comment: occ  . Drug use: No     Allergies   Patient has no known allergies.   Review of Systems Review of Systems  Constitutional: Positive for chills. Negative for fever.  Gastrointestinal: Positive for abdominal pain (over her bladder). Negative for diarrhea, nausea and vomiting.  Genitourinary: Positive for decreased urine volume, dysuria, frequency, hematuria and urgency. Negative for flank pain, pelvic pain, vaginal bleeding, vaginal discharge and vaginal pain.  Musculoskeletal: Negative for back pain.  Neurological:  Negative for dizziness, light-headedness and headaches.     Physical Exam Triage Vital Signs ED Triage Vitals  Enc Vitals Group     BP 10/22/19 0942 114/75     Pulse Rate 10/22/19 0942 92     Resp 10/22/19 0942 16     Temp 10/22/19 0942 98.1 F (36.7 C)     Temp Source 10/22/19 0942 Oral     SpO2 10/22/19 0942 99 %     Weight 10/22/19 0942 110 lb (49.9 kg)     Height 10/22/19 0942 5\' 6"  (1.676 m)     Head Circumference --      Peak Flow --      Pain Score 10/22/19 0941 6     Pain Loc --      Pain Edu? --      Excl. in [place]? --    No data found.  Updated Vital Signs BP 114/75 (BP Location: Right Arm)   Pulse 92   Temp 98.1 F (36.7 C) (Oral)   Resp 16   Ht 5\' 6"  (1.676 m)   Wt 110 lb (49.9 kg)   LMP 09/23/2019   SpO2 99%   BMI 17.75 kg/m   Visual Acuity Right Eye Distance:   Left Eye Distance:   Bilateral Distance:    Right Eye Near:   Left Eye Near:    Bilateral Near:     Physical Exam Vitals and nursing note reviewed.  Constitutional:      General: She is not in acute distress.    Appearance: Normal appearance. She is well-developed. She is not ill-appearing, toxic-appearing or diaphoretic.  HENT:     Head: Normocephalic and atraumatic.     Mouth/Throat:     Mouth: Mucous membranes are moist.  Cardiovascular:     Rate and Rhythm: Normal rate and regular rhythm.  Pulmonary:     Effort: Pulmonary effort is normal. No respiratory distress.     Breath sounds: Normal breath sounds.  Abdominal:     General: There is no distension.     Palpations: Abdomen is soft.     Tenderness: There is no abdominal tenderness. There is no right CVA tenderness or left CVA tenderness.  Musculoskeletal:        General: Normal range of motion.     Cervical back: Normal range of motion.  Skin:    General: Skin is warm and dry.  Neurological:     Mental Status: She is alert and oriented to person, place, and time.  Psychiatric:        Behavior: Behavior normal.  UC Treatments / Results  Labs (all labs ordered are listed, but only abnormal results are displayed) Labs Reviewed  URINE CULTURE  POCT URINALYSIS DIP (MANUAL ENTRY)    EKG   Radiology No results found.  Procedures Procedures (including critical care time)  Medications Ordered in UC Medications - No data to display  Initial Impression / Assessment and Plan / UC Course  I have reviewed the triage vital signs and the nursing notes.  Pertinent labs & imaging results that were available during my care of the patient were reviewed by me and considered in my medical decision making (see chart for details).     Hx and UA c/w UTI Culture sent Will start pt on keflex while culture pending. AVS provided  Final Clinical Impressions(s) / UC Diagnoses   Final diagnoses:  Dysuria  Acute cystitis with hematuria     Discharge Instructions      Please take your antibiotic as prescribed. A urine culture has been sent to check the severity of your urinary infection and to determine if you are on the most appropriate antibiotic. The results should come back within 2-3 days. You will only be notified if a medication change is indicated.  Please follow up with family medicine or urology if not improving within 1 week, sooner if symptoms worsening.      ED Prescriptions    Medication Sig Dispense Auth. Provider   cephALEXin (KEFLEX) 500 MG capsule Take 1 capsule (500 mg total) by mouth 2 (two) times daily. 14 capsule Noe Gens, [place]     PDMP not reviewed this encounter.   Noe Gens, [place] 10/22/19 1316

## 2019-10-22 NOTE — Discharge Instructions (Signed)
  Please take your antibiotic as prescribed. A urine culture has been sent to check the severity of your urinary infection and to determine if you are on the most appropriate antibiotic. The results should come back within 2-3 days. You will only be notified if a medication change is indicated.  Please follow up with family medicine or urology if not improving within 1 week, sooner if symptoms worsening.   

## 2019-10-22 NOTE — ED Triage Notes (Signed)
Pt c/o dysuria x 2 days

## 2019-10-24 LAB — URINE CULTURE
MICRO NUMBER:: 10308109
SPECIMEN QUALITY:: ADEQUATE

## 2019-10-28 NOTE — Progress Notes (Signed)
Office Visit Note  Patient: Robin [name]             Date of Birth: 04/07/1986           MRN: [id number]             PCP: Patient, No Pcp Per Referring: No ref. provider found Visit Date: 10/29/2019 Occupation: @GUAROCC @  Subjective:  Pain and swelling in joints.   History of Present Illness: Robin [name] is a 34 y.o. female seen in consultation per request of her PCP.  According to patient her symptoms are started in 2018 with intermittent pain in her hands and wrist joints.  She states that her episodes used to happen about every 2 to 3 months.  In 2019 she had an episode of toe pain which resolved.  In March 2020 she started having increased feet pain.  At the time she was seen by her PCP and her rheumatoid factor was positive.  She was referred to [facility] rheumatology in October 2020 where she was seen by Dr. Clarisse Gouge.  He diagnosed her with rheumatoid arthritis and started on Plaquenil which she took for about 2 months and then was switched to methotrexate.  She was on methotrexate 0.6 mL subcu weekly for 2 weeks and then increased to 0.8 mL subcu weekly after that.  She states she did not notice any improvement on methotrexate she continues to have pain and swelling and also suffered a lot of nausea.  She has been off methotrexate for 6 weeks now.  She states that 2 weeks ago she was diagnosed with urinary tract infection and she is currently taking Keflex.  She will be taking the last dose of Keflex today.  Currently she has discomfort in her bilateral hands and her bilateral feet.  She has some discomfort in her cervical spine off and on.  There is no family history of autoimmune disease.  She is gravida 6, para 5.  Activities of Daily Living:  Patient reports morning stiffness for 24 hours.   Patient Reports nocturnal pain.  Difficulty dressing/grooming: Denies Difficulty climbing stairs: Reports Difficulty getting out of chair: Denies Difficulty using hands for taps, buttons, cutlery,  and/or writing: Reports  Review of Systems  Constitutional: Positive for fatigue. Negative for night sweats, weight gain and weight loss.  HENT: Positive for mouth dryness. Negative for mouth sores, trouble swallowing, trouble swallowing and nose dryness.   Eyes: Positive for dryness. Negative for pain, redness and visual disturbance.  Respiratory: Negative for cough, shortness of breath and difficulty breathing.   Cardiovascular: Negative for chest pain, palpitations, hypertension, irregular heartbeat and swelling in legs/feet.  Gastrointestinal: Positive for constipation. Negative for blood in stool and diarrhea.  Endocrine: Negative for cold intolerance, excessive thirst and increased urination.  Genitourinary: Negative for difficulty urinating and vaginal dryness.  Musculoskeletal: Positive for arthralgias, gait problem, joint pain, joint swelling, morning stiffness and muscle tenderness. Negative for myalgias, muscle weakness and myalgias.  Skin: Positive for hair loss. Negative for color change, rash, redness, skin tightness, ulcers and sensitivity to sunlight.  Allergic/Immunologic: Negative for susceptible to infections.  Neurological: Negative for dizziness, memory loss, night sweats and weakness.  Hematological: Negative for bruising/bleeding tendency and swollen glands.  Psychiatric/Behavioral: Positive for depressed mood and sleep disturbance. The patient is nervous/anxious.     PMFS History:  Patient Active Problem List   Diagnosis Date Noted  . Normal labor 07/09/2018  . Pregnancy with history of neonatal death 2018-05-19  .  Depression affecting pregnancy 04/02/2018  . Irregular periods 01/26/2018  . Hereditary disease in family possibly affecting fetus, affecting management of mother, antepartum condition or complication, not applicable or unspecified fetus   . History of preterm delivery 11/17/2017  . Supervision of high risk pregnancy, antepartum 11/17/2017  . Previous  cesarean section 11/17/2017  . Family history of congenital anomaly 11/17/2017  . Family history of congenital heart disease 07/27/2015  . Family hx of bicuspid aortic valve--FOB 04/03/2012    Past Medical History:  Diagnosis Date  . Abnormal Pap smear 2008   HAD HPV;HAD GENITAL WART REMOVED;LAST PAP 07/2010 WAS NORMAL  . Anemia    FeSO4 SUPP TAKEN IN PAST  . Asthma    AS A CHILD;GREW OUT OF @ 8 YOA  . Depression    after the death of her son, was on meds then  . History of smoking 12/26/2011   Reports quit '12  . Infection    UTI;CAN GET FREQ  . Rheumatoid arthritis ([place])   . Vaginal Pap smear, abnormal     Family History  Problem Relation Age of Onset  . Mitral valve prolapse Mother        DIED @ 22 YOA OF CONDITION  . Healthy Father   . Mitral valve prolapse Paternal Aunt   . Heart attack Paternal Grandfather        DECEASED  . Dementia Maternal Grandmother    Past Surgical History:  Procedure Laterality Date  . CESAREAN SECTION N/A 09/09/2016   Procedure: CESAREAN SECTION;  Surgeon: Osborne Oman, MD;  Location: [facility];  Service: Obstetrics;  Laterality: N/A;  . NASAL SINUS SURGERY  2016   Social History   Social History Narrative  . Not on file   There is no immunization history for the selected administration types on file for this patient.   Objective: Vital Signs: BP 109/71 (BP Location: Right Arm, Patient Position: Sitting, Cuff Size: Normal)   Pulse (!) 119   Resp 14   Ht 5' 5"  (1.651 m)   Wt 114 lb (51.7 kg)   LMP 09/23/2019   BMI 18.97 kg/m    Physical Exam Vitals and nursing note reviewed.  Constitutional:      Appearance: She is well-developed.  HENT:     Head: Normocephalic and atraumatic.  Eyes:     Conjunctiva/sclera: Conjunctivae normal.  Cardiovascular:     Rate and Rhythm: Normal rate and regular rhythm.     Heart sounds: Normal heart sounds.  Pulmonary:     Effort: Pulmonary effort is normal.     Breath sounds: Normal  breath sounds.  Abdominal:     General: Bowel sounds are normal.     Palpations: Abdomen is soft.  Musculoskeletal:     Cervical back: Normal range of motion.  Lymphadenopathy:     Cervical: No cervical adenopathy.  Skin:    General: Skin is warm and dry.     Capillary Refill: Capillary refill takes less than 2 seconds.  Neurological:     Mental Status: She is alert and oriented to person, place, and time.  Psychiatric:        Behavior: Behavior normal.      Musculoskeletal Exam: C-spine was in good range of motion.  She has synovitis over right sternoclavicular joint.  Elbow joints and shoulder joints with good range of motion.  She had good range of motion of her bilateral wrist joints.  She has synovitis of her right second  MCP joint and tenderness over the left second MCP joint.  She had good fist formation.  Hip joints and knee joints in good range of motion.  Ankle joints with good range of motion.  She has tenderness on palpation of her left ankle joint.  She has tenderness and swelling over the right fourth and fifth MTPs.  CDAI Exam: CDAI Score: 4.4  Patient Global: 8 mm; Provider Global: 6 mm Swollen: 4 ; Tender: 6  Joint Exam 10/29/2019      Right  Left  Sternoclavicular  Swollen Tender     MCP 2  Swollen Tender   Tender  Ankle      Tender  MTP 4  Swollen Tender     MTP 5  Swollen Tender        Investigation: No additional findings.  Imaging: XR Foot 2 Views Left  Result Date: 10/29/2019 Juxta-articular osteopenia was noted.  No MTP PIP or DIP narrowing was noted.  No erosive changes were noted.  No intertarsal tibiotalar joint space narrowing was noted. Impression: These findings are consistent with rheumatoid arthritis of the foot.  XR Foot 2 Views Right  Result Date: 10/29/2019 Mild PIP and DIP narrowing was noted.  No MTP joint narrowing was noted.  Erosive change was noted over fourth MTP joint.  No intertarsal or tibiotalar joint space narrowing was noted.   Juxta-articular osteopenia was noted. 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  XR Hand 2 View Left  Result Date: 10/29/2019 Juxta-articular osteopenia was noted.  Mild PIP narrowing was noted.  No intercarpal radiocarpal joint space narrowing was noted.  No erosive changes were noted. Impression: These findings are consistent with rheumatoid arthritis of the hand.  XR Hand 2 View Right  Result Date: 10/29/2019 Juxta-articular osteopenia was noted.  Second and third MCP joint narrowing was noted.  PIP narrowing was noted.  No intercarpal radiocarpal joint space narrowing was noted.  Erosive changes noted over right second MCP joint. 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   Recent Labs: Lab Results  Component Value Date   WBC 10.6 (H) 07/09/2018   HGB 10.5 (L) 07/09/2018   PLT 296 07/09/2018   GLUCOSE 69 05/04/2016    Speciality Comments: No specialty comments available.  Procedures:  No procedures performed Allergies: Patient has no known allergies.   Assessment / Plan:     Visit Diagnoses: Rheumatoid arthritis with rheumatoid factor of multiple sites without organ or systems involvement ([place]) - +CCP, +RF -patient has been experiencing symptoms of rheumatoid arthritis for the last few years.  She was diagnosed with rheumatoid arthritis at [facility] last year.  She has been tried on Plaquenil for couple of months and then methotrexate for couple of months.  She discontinued methotrexate due to nausea and an adequate response.  She has synovitis in multiple joints as described above.  She also has erosive changes.  Different treatment options and their side effects were discussed.  We discussed use of Enbrel.  Indications side effects contraindications were discussed at length.  Patient is willing to proceed with Enbrel.  And informed consent was obtained.  We will apply for Enbrel once lab results are available.  We will  obtain following labs today.  She also had recent UTI will repeat UA prior to starting her on medications.  Plan: Urinalysis, Routine w reflex microscopic, Sedimentation rate, CK, TSH, Rheumatoid factor, Cyclic citrul peptide antibody, IgG, 14-3-3 eta Protein, ANA  Pain in both  hands -she has synovitis in her hands.  The x-rays today revealed erosions and juxta-articular osteopenia.  Plan: XR Hand 2 View Right, XR Hand 2 View Left  Pain in both feet -she has synovitis on her feet as described above.  The x-ray showed erosive changes.  Plan: XR Foot 2 Views Right, XR Foot 2 Views Left  High risk medication use - Started on PLQ by Dr. Flonnie Overman at [place] after OV on 01/14/19. added on MTX-Jan 2021 CBC/CMP WNL, CRP 0.07, ESR 7, Hep C-, patient has been off methotrexate for the last 6 weeks.- Plan: Hepatitis B surface antigen  Hereditary disease in family possibly affecting fetus, affecting management of mother, antepartum condition or complication, not applicable or unspecified fetus - Plan: CBC with Differential/Platelet, COMPLETE METABOLIC PANEL WITH GFR, QuantiFERON-TB Gold Plus, Serum protein electrophoresis with reflex, IgG, IgA, IgM, Hepatitis B core antibody, IgM  History of preterm delivery  Pregnancy with history of neonatal death - at 28 weeks  Family hx of bicuspid aortic valve--FOB  Family history of congenital heart disease  Orders: Orders Placed This Encounter  Procedures  . XR Hand 2 View Right  . XR Hand 2 View Left  . XR Foot 2 Views Right  . XR Foot 2 Views Left  . CBC with Differential/Platelet  . COMPLETE METABOLIC PANEL WITH GFR  . Urinalysis, Routine w reflex microscopic  . Sedimentation rate  . CK  . TSH  . Rheumatoid factor  . Cyclic citrul peptide antibody, IgG  . 14-3-3 eta Protein  . ANA  . QuantiFERON-TB Gold Plus  . Serum protein electrophoresis with reflex  . IgG, IgA, IgM  . Hepatitis B core antibody, IgM  . Hepatitis B surface antigen   No orders of  the defined types were placed in this encounter.   Face-to-face time spent with patient was 50 minutes. Greater than 50% of time was spent in counseling and coordination of care.  Follow-Up Instructions: Return for Rheumatoid arthritis.   Bo Merino, MD  Note - This record has been created using Editor, commissioning.  Chart creation errors have been sought, but may not always  have been located. Such creation errors do not reflect on  the standard of medical care.

## 2019-10-29 ENCOUNTER — Ambulatory Visit: Payer: Self-pay

## 2019-10-29 ENCOUNTER — Telehealth: Payer: Self-pay | Admitting: Rheumatology

## 2019-10-29 ENCOUNTER — Ambulatory Visit (INDEPENDENT_AMBULATORY_CARE_PROVIDER_SITE_OTHER): Payer: 59 | Admitting: Rheumatology

## 2019-10-29 ENCOUNTER — Other Ambulatory Visit: Payer: Self-pay

## 2019-10-29 ENCOUNTER — Encounter: Payer: Self-pay | Admitting: Rheumatology

## 2019-10-29 VITALS — BP 109/71 | HR 119 | Resp 14 | Ht 65.0 in | Wt 114.0 lb

## 2019-10-29 DIAGNOSIS — Z8774 Personal history of (corrected) congenital malformations of heart and circulatory system: Secondary | ICD-10-CM | POA: Diagnosis not present

## 2019-10-29 DIAGNOSIS — Z79899 Other long term (current) drug therapy: Secondary | ICD-10-CM

## 2019-10-29 DIAGNOSIS — Z8279 Family history of other congenital malformations, deformations and chromosomal abnormalities: Secondary | ICD-10-CM

## 2019-10-29 DIAGNOSIS — Z8751 Personal history of pre-term labor: Secondary | ICD-10-CM | POA: Diagnosis not present

## 2019-10-29 DIAGNOSIS — M79672 Pain in left foot: Secondary | ICD-10-CM

## 2019-10-29 DIAGNOSIS — O352XX0 Maternal care for (suspected) hereditary disease in fetus, not applicable or unspecified: Secondary | ICD-10-CM

## 2019-10-29 DIAGNOSIS — O09299 Supervision of pregnancy with other poor reproductive or obstetric history, unspecified trimester: Secondary | ICD-10-CM | POA: Diagnosis not present

## 2019-10-29 DIAGNOSIS — M79641 Pain in right hand: Secondary | ICD-10-CM | POA: Diagnosis not present

## 2019-10-29 DIAGNOSIS — M79671 Pain in right foot: Secondary | ICD-10-CM | POA: Diagnosis not present

## 2019-10-29 DIAGNOSIS — M0579 Rheumatoid arthritis with rheumatoid factor of multiple sites without organ or systems involvement: Secondary | ICD-10-CM | POA: Diagnosis not present

## 2019-10-29 DIAGNOSIS — M79642 Pain in left hand: Secondary | ICD-10-CM

## 2019-10-29 MED ORDER — PREDNISONE 5 MG PO TABS: ORAL_TABLET | ORAL | 0 refills | Status: DC

## 2019-10-29 NOTE — Progress Notes (Signed)
Pharmacy Note  Subjective: Patient presents today to the [facility] for follow up office visit.  Patient seen by the pharmacist for counseling on Enbrel for rheumatoid arthritis.  She has tried and failed Plaquenil and methotrexate prescribed by a previous rheumatologist at [facility].  Objective:  CBC    Component Value Date/Time   WBC 10.6 (H) 07/09/2018 0335   RBC 4.01 07/09/2018 0335   HGB 10.5 (L) 07/09/2018 0335   HCT 33.3 (L) 07/09/2018 0335   PLT 296 07/09/2018 0335   MCV 83.0 07/09/2018 0335   MCH 26.2 07/09/2018 0335   MCHC 31.5 07/09/2018 0335   RDW 13.9 07/09/2018 0335   LYMPHSABS 1,317 12/01/2017 1103   MONOABS 0.5 07/08/2016 1748   EOSABS 215 12/01/2017 1103   BASOSABS 74 12/01/2017 1103     CMP     Component Value Date/Time   GLUCOSE 69 05/04/2016 1041     Baseline Immunosuppressant Therapy Labs TB GOLD Pending 10/29/2019  Hepatitis Panel Hepatitis Latest Ref Rng & Units 12/01/2017  Hep B Surface Ag NON-REACTI NON-REACTIVE  Hepatitis C antibody negative 01/14/2019 at [facility]  HIV Lab Results  Component Value Date   HIV NON-REACTIVE 04/27/2018   HIV NON-REACTIVE 12/01/2017   HIV Non Reactive 09/06/2016   HIV NONREACTIVE 05/04/2016   HIV NONREACTIVE 09/07/2015   Immunoglobulins Pending 10/29/2019  SPEP Pending 10/29/2019  G6PD No results found for: G6PDH TPMT No results found for: TPMT   Chest x-ray: normal 01/10/2008 at [facility]   Contraception: vasectomy  Does patient have diagnosis of heart failure?  No  Assessment/Plan:  Counseled patient that Enbrel is a TNF blocking agent. Counseled patient on purpose, proper use, and adverse effects of Enbrel.  Reviewed the most common adverse effects including infections, headache, and injection site reactions. Discussed that there is the possibility of an increased risk of malignancy but it is not well understood if this increased risk is due to the medication or the disease state.  Advised patient to  get yearly dermatology exams due to risk of skin cancer.  Counseled patient that Enbrel should be held prior to scheduled surgery.  Counseled patient to avoid live vaccines while on Enbrel. Recommend annual influenza, Pneumovax 23, Prevnar 13, and Shingrix as indicated.   Reviewed the importance of regular labs while on Enbrel therapy.  Advised patient to get standing labs one month after starting Enbrel then every 2 months.  Provided patient with standing lab orders.  Provided patient with medication education material and answered all questions.  Patient voiced understanding.  Patient consented to Enbrel.  Will upload consent into the media tab.  Reviewed storage instructions for Enbrel.  Advised initial injection must be administered in office.  Patient voiced understanding.    Patient dose will be Enbrel 50 mg every 7 days.  Prescription pending labs and/or insurance.  She has Nurse, learning disability and will be co-pay card eligible.  All questions encouraged and answered.  Instructed patient to call with any further questions or concerns.  Verlin Fester, PharmD, [place], CPP Clinical Specialty Pharmacist [phone]  10/29/2019 9:51 AM

## 2019-10-29 NOTE — Telephone Encounter (Signed)
We did not discuss prednisone taper at the office visit.  If she wants to start on prednisone taper we can start her on prednisone 20 mg p.o. every morning and then taper by 5 mg every 4 days.  Please notify patient that the side effects could be weight gain, elevated blood pressure, elevated blood sugar and bone thinning.  It will be only a short-term taper.

## 2019-10-29 NOTE — Patient Instructions (Signed)
Etanercept injection What is this medicine? ETANERCEPT (et a NER sept) is used for the treatment of rheumatoid arthritis in adults and children. The medicine is also used to treat psoriatic arthritis, ankylosing spondylitis, and psoriasis. This medicine may be used for other purposes; ask your health care provider or pharmacist if you have questions. COMMON BRAND NAME(S): Enbrel What should I tell my health care provider before I take this medicine? They need to know if you have any of these conditions:  blood disorders  cancer  congestive heart failure  diabetes  exposure to chickenpox  immune system problems  infection  multiple sclerosis  seizure disorder  tuberculosis, a positive skin test for tuberculosis or have recently been in close contact with someone who has tuberculosis  Wegener's granulomatosis  an unusual or allergic reaction to etanercept, latex, other medicines, foods, dyes, or preservatives  pregnant or trying to get pregnant  breast-feeding How should I use this medicine? The medicine is given by injection under the skin. You will be taught how to prepare and give this medicine. Use exactly as directed. Take your medicine at regular intervals. Do not take your medicine more often than directed. It is important that you put your used needles and syringes in a special sharps container. Do not put them in a trash can. If you do not have a sharps container, call your pharmacist or healthcare provider to get one. A special MedGuide will be given to you by the pharmacist with each prescription and refill. Be sure to read this information carefully each time. Talk to your pediatrician regarding the use of this medicine in children. While this drug may be prescribed for children as young as 4 years of age for selected conditions, precautions do apply. Overdosage: If you think you have taken too much of this medicine contact a poison control center or emergency room  at once. NOTE: This medicine is only for you. Do not share this medicine with others. What if I miss a dose? If you miss a dose, contact your health care professional to find out when you should take your next dose. Do not take double or extra doses without advice. What may interact with this medicine? Do not take this medicine with any of the following medications:  anakinra This medicine may also interact with the following medications:  cyclophosphamide  sulfasalazine  vaccines This list may not describe all possible interactions. Give your health care provider a list of all the medicines, herbs, non-prescription drugs, or dietary supplements you use. Also tell them if you smoke, drink alcohol, or use illegal drugs. Some items may interact with your medicine. What should I watch for while using this medicine? Tell your doctor or healthcare professional if your symptoms do not start to get better or if they get worse. You will be tested for tuberculosis (TB) before you start this medicine. If your doctor prescribes any medicine for TB, you should start taking the TB medicine before starting this medicine. Make sure to finish the full course of TB medicine. Call your doctor or health care professional for advice if you get a fever, chills or sore throat, or other symptoms of a cold or flu. Do not treat yourself. This drug decreases your body's ability to fight infections. Try to avoid being around people who are sick. What side effects may I notice from receiving this medicine? Side effects that you should report to your doctor or health care professional as soon as possible:  allergic   reactions like skin rash, itching or hives, swelling of the face, lips, or tongue  changes in vision  fever, chills or any other sign of infection  numbness or tingling in legs or other parts of the body  red, scaly patches or raised bumps on the skin  shortness of breath or difficulty  breathing  swollen lymph nodes in the neck, underarm, or groin areas  unexplained weight loss  unusual bleeding or bruising  unusual swelling or fluid retention in the legs  unusually weak or tired Side effects that usually do not require medical attention (report to your doctor or health care professional if they continue or are bothersome):  dizziness  headache  nausea  redness, itching, or swelling at the injection site  vomiting This list may not describe all possible side effects. Call your doctor for medical advice about side effects. You may report side effects to FDA at 1-800-FDA-1088. Where should I keep my medicine? Keep out of the reach of children. Enbrel products: Store unopened Enbrel vials, cartridges, or pens in a refrigerator between 2 and 8 degrees C (36 and 46 degrees F). Do not freeze. Do not shake. Keep in the original container to protect from light. Throw away any unopened and unused medicine that has been stored in the refrigerator after the expiration date. If needed, you may store an Enbrel single-use vial, single-dose prefilled syringe, SureClick autoinjector, or Enbrel Mini cartridge at room temperature between 20 to 25 degrees C (68 to 77 degrees F) for up to 14 days. Protect from light, heat, and moisture. Do not shake. Once any of these items are stored at room temperature, do not place them back into the refrigerator. Throw the item away after 14 days, even if it still contains medicine. If you are using the Enbrel multi-dose vials, you will be instructed on how to dilute and store this medicine once it has been diluted. The Enbrel AutoTouch reusable autoinjector device should be stored at room temperature. Do NOT refrigerate the AutoTouch reusable autoinjector. Erelzi products: Store Erelzi prefilled syringes or Sensoready pen in the refrigerator between 2 and 8 degrees C (36 and 46 degrees F). Do not freeze. Do not shake. Keep in the original container  to protect from light. Throw away any unopened and unused medicine that has been stored in the refrigerator after the expiration date. If needed, you may store the Erelzi prefilled syringe or pen at room temperature between 20 to 25 degrees C (68 to 77 degrees F) for up to 28 days. Protect from light, heat, and moisture. Do not shake. Once any of these items are stored at room temperature, do not place them back into the refrigerator. Throw the item away after 28 days, even if it still contains medicine. NOTE: This sheet is a summary. It may not cover all possible information. If you have questions about this medicine, talk to your doctor, pharmacist, or health care provider.  2020 Elsevier/Gold Standard (2018-10-08 09:49:58)  

## 2019-10-29 NOTE — Telephone Encounter (Addendum)
Patient was seen this morning as a new patient. Patient was consented on enbrel. Are we sending a prednisone taper to the pharmacy?

## 2019-10-29 NOTE — Telephone Encounter (Signed)
Advised patient of prednisone taper and side effects listed, patient verbalized understanding. Patient requested the taper to be sent to [facility].

## 2019-10-29 NOTE — Telephone Encounter (Signed)
Patient left a voicemail checking the status of the prescription of Prednisone Dr. Corliss Skains was sending to the pharmacy.

## 2019-10-31 NOTE — Progress Notes (Signed)
Labs are consistent with rheumatoid arthritis.  Patient should be ready to start on Enbrel once all the labs are available.

## 2019-11-03 LAB — CBC WITH DIFFERENTIAL/PLATELET
Absolute Monocytes: 506 cells/uL (ref 200–950)
Basophils Absolute: 71 cells/uL (ref 0–200)
Basophils Relative: 0.9 %
Eosinophils Absolute: 300 cells/uL (ref 15–500)
Eosinophils Relative: 3.8 %
HCT: 39.1 % (ref 35.0–45.0)
Hemoglobin: 12.8 g/dL (ref 11.7–15.5)
Lymphs Abs: 1232 cells/uL (ref 850–3900)
MCH: 30 pg (ref 27.0–33.0)
MCHC: 32.7 g/dL (ref 32.0–36.0)
MCV: 91.8 fL (ref 80.0–100.0)
MPV: 9.8 fL (ref 7.5–12.5)
Monocytes Relative: 6.4 %
Neutro Abs: 5791 cells/uL (ref 1500–7800)
Neutrophils Relative %: 73.3 %
Platelets: 304 10*3/uL (ref 140–400)
RBC: 4.26 10*6/uL (ref 3.80–5.10)
RDW: 12.7 % (ref 11.0–15.0)
Total Lymphocyte: 15.6 %
WBC: 7.9 10*3/uL (ref 3.8–10.8)

## 2019-11-03 LAB — COMPLETE METABOLIC PANEL WITH GFR
AG Ratio: 1.8 (calc) (ref 1.0–2.5)
ALT: 14 U/L (ref 6–29)
AST: 14 U/L (ref 10–30)
Albumin: 4.4 g/dL (ref 3.6–5.1)
Alkaline phosphatase (APISO): 49 U/L (ref 31–125)
BUN: 10 mg/dL (ref 7–25)
CO2: 25 mmol/L (ref 20–32)
Calcium: 9.5 mg/dL (ref 8.6–10.2)
Chloride: 108 mmol/L (ref 98–110)
Creat: 0.64 mg/dL (ref 0.50–1.10)
GFR, Est African American: 137 mL/min/{1.73_m2} (ref >=60)
GFR, Est Non African American: 118 mL/min/{1.73_m2} (ref >=60)
Globulin: 2.4 g/dL (calc) (ref 1.9–3.7)
Glucose, Bld: 85 mg/dL (ref 65–99)
Potassium: 4 mmol/L (ref 3.5–5.3)
Sodium: 140 mmol/L (ref 135–146)
Total Bilirubin: 0.3 mg/dL (ref 0.2–1.2)
Total Protein: 6.8 g/dL (ref 6.1–8.1)

## 2019-11-03 LAB — 14-3-3 ETA PROTEIN: 14-3-3 eta Protein: <0.2 ng/mL (ref <0.2)

## 2019-11-03 LAB — URINALYSIS, ROUTINE W REFLEX MICROSCOPIC
Bacteria, UA: NONE SEEN /HPF
Bilirubin Urine: NEGATIVE
Glucose, UA: NEGATIVE
Hgb urine dipstick: NEGATIVE
Hyaline Cast: NONE SEEN /LPF
Nitrite: NEGATIVE
Protein, ur: NEGATIVE
Specific Gravity, Urine: 1.025 (ref 1.001–1.03)
pH: 5.5 (ref 5.0–8.0)

## 2019-11-03 LAB — ANA: Anti Nuclear Antibody (ANA): NEGATIVE

## 2019-11-03 LAB — QUANTIFERON-TB GOLD PLUS
Mitogen-NIL: >10 IU/mL
NIL: 0.05 IU/mL
QuantiFERON-TB Gold Plus: NEGATIVE
TB1-NIL: <0 IU/mL
TB2-NIL: <0 IU/mL

## 2019-11-03 LAB — IGG, IGA, IGM
IgG (Immunoglobin G), Serum: 1010 mg/dL (ref 600–1640)
IgM, Serum: 98 mg/dL (ref 50–300)
Immunoglobulin A: 272 mg/dL (ref 47–310)

## 2019-11-03 LAB — HEPATITIS B SURFACE ANTIGEN: Hepatitis B Surface Ag: NONREACTIVE

## 2019-11-03 LAB — CYCLIC CITRUL PEPTIDE ANTIBODY, IGG: Cyclic Citrullin Peptide Ab: >250 UNITS — ABNORMAL HIGH

## 2019-11-03 LAB — PROTEIN ELECTROPHORESIS, SERUM, WITH REFLEX
Albumin ELP: 4.1 g/dL (ref 3.8–4.8)
Alpha 1: 0.3 g/dL (ref 0.2–0.3)
Alpha 2: 0.8 g/dL (ref 0.5–0.9)
Beta 2: 0.4 g/dL (ref 0.2–0.5)
Beta Globulin: 0.5 g/dL (ref 0.4–0.6)
Gamma Globulin: 0.9 g/dL (ref 0.8–1.7)
Total Protein: 6.9 g/dL (ref 6.1–8.1)

## 2019-11-03 LAB — SEDIMENTATION RATE: Sed Rate: 6 mm/h (ref 0–20)

## 2019-11-03 LAB — RHEUMATOID FACTOR: Rheumatoid fact SerPl-aCnc: 15 IU/mL — ABNORMAL HIGH (ref <14)

## 2019-11-03 LAB — CK: Total CK: 56 U/L (ref 29–143)

## 2019-11-03 LAB — TSH: TSH: 1.05 mIU/L

## 2019-11-03 LAB — HEPATITIS B CORE ANTIBODY, IGM: Hep B C IgM: NONREACTIVE

## 2019-11-03 NOTE — Progress Notes (Signed)
All the labs are available.

## 2019-11-04 ENCOUNTER — Telehealth: Payer: Self-pay | Admitting: Pharmacist

## 2019-11-04 NOTE — Telephone Encounter (Signed)
Received notification from [employer] regarding a prior authorization for ENBREL. Authorization has been APPROVED from 10/05/19 to 02/02/20.   Authorization # [id number] GBMBOM:[id number]  Patient must fill through [facility]. Patient has Nurse, learning disability and is able to use a copay card.  11:28 AM Dorthula Nettles, CPhT

## 2019-11-04 NOTE — Telephone Encounter (Signed)
Please start benefits investigation for Enbrel for treatment of RA.  Patient has tried and failed Plaquenil and methotrexate.   Verlin Fester, PharmD, [place], CPP Clinical Specialty Pharmacist [phone]  11/04/2019 9:37 AM

## 2019-11-04 NOTE — Progress Notes (Signed)
Pharmacy Note  Subjective:   Patient is being initiated on Enbrel.  Patient was previously counseled extensively on and consented to initiation of Enbrel at that time.  Patient presents to clinic today to receive the first dose of Enbrel.     Patient running a fever or have signs/symptoms of infection? No  Patient currently on antibiotics for the treatment of infection? No  Patient have any upcoming invasive procedures/surgeries? No  Objective: CMP     Component Value Date/Time   NA 140 10/29/2019 1018   K 4.0 10/29/2019 1018   CL 108 10/29/2019 1018   CO2 25 10/29/2019 1018   GLUCOSE 85 10/29/2019 1018   BUN 10 10/29/2019 1018   CREATININE 0.64 10/29/2019 1018   CALCIUM 9.5 10/29/2019 1018   PROT 6.8 10/29/2019 1018   PROT 6.9 10/29/2019 1018   AST 14 10/29/2019 1018   ALT 14 10/29/2019 1018   BILITOT 0.3 10/29/2019 1018   GFRNONAA 118 10/29/2019 1018   GFRAA 137 10/29/2019 1018    CBC    Component Value Date/Time   WBC 7.9 10/29/2019 1018   RBC 4.26 10/29/2019 1018   HGB 12.8 10/29/2019 1018   HCT 39.1 10/29/2019 1018   PLT 304 10/29/2019 1018   MCV 91.8 10/29/2019 1018   MCH 30.0 10/29/2019 1018   MCHC 32.7 10/29/2019 1018   RDW 12.7 10/29/2019 1018   LYMPHSABS 1,232 10/29/2019 1018   MONOABS 0.5 07/08/2016 1748   EOSABS 300 10/29/2019 1018   BASOSABS 71 10/29/2019 1018    Baseline Immunosuppressant Therapy Labs TB GOLD Quantiferon TB Gold Latest Ref Rng & Units 10/29/2019  Quantiferon TB Gold Plus NEGATIVE NEGATIVE   Hepatitis Panel Hepatitis Latest Ref Rng & Units 10/29/2019  Hep B Surface Ag NON-REACTI NON-REACTIVE  Hep B IgM NON-REACTI NON-REACTIVE   HIV Lab Results  Component Value Date   HIV NON-REACTIVE 04/27/2018   HIV NON-REACTIVE 12/01/2017   HIV Non Reactive 09/06/2016   HIV NONREACTIVE 05/04/2016   HIV NONREACTIVE 09/07/2015   Immunoglobulins Immunoglobulin Electrophoresis Latest Ref Rng & Units 10/29/2019  IgA  47 - 310 mg/dL 272  IgG  600 - 1,640 mg/dL 1,010  IgM 50 - 300 mg/dL 98   SPEP Serum Protein Electrophoresis Latest Ref Rng & Units 10/29/2019  Total Protein 6.1 - 8.1 g/dL 6.9  Albumin 3.8 - 4.8 g/dL 4.1  Alpha-1 0.2 - 0.3 g/dL 0.3  Alpha-2 0.5 - 0.9 g/dL 0.8  Beta Globulin 0.4 - 0.6 g/dL 0.5  Beta 2 0.2 - 0.5 g/dL 0.4  Gamma Globulin 0.8 - 1.7 g/dL 0.9   G6PD No results found for: G6PDH TPMT No results found for: TPMT   Chest x-ray: normal 01/10/2008 at [facility]   Assessment/Plan:  Demonstrated proper injection technique with Enbrel Mini demo pen.  Patient able to demonstrate proper injection technique using the teach back method.  Patient self injected in the lower abdomen with:  Sample Medication: Enbrel Mini NDC: Enbrel Mini 50 mg Lot: [id number] Expiration: 02/2021  Patient tolerated well.  Observed for 30 mins in office for adverse reaction and none noted. Given a sample with the same lot and expiration in case of shipping delays.  Patient is to return in 1 month for follow up appointment and labs. She has an appointment already scheduled for 12/03/2019. Standing orders placed. Prescription sent to [facility] required per insurance.  She is eligible to use co-pay card.  Patient called Enbrel support to enroll and will receive debit  card in the mail.  All questions encouraged and answered.  Instructed patient to call with any further questions or concerns.  Verlin Fester, PharmD, [place], CPP Clinical Specialty Pharmacist [phone]  11/07/2019 10:33 AM

## 2019-11-04 NOTE — Telephone Encounter (Signed)
Submitted a Prior Authorization request to [employer] for ENBREL via Cover My Meds. Will update once we receive a response.  (Key: [id number] - [id number]

## 2019-11-07 ENCOUNTER — Other Ambulatory Visit: Payer: Self-pay

## 2019-11-07 ENCOUNTER — Ambulatory Visit (INDEPENDENT_AMBULATORY_CARE_PROVIDER_SITE_OTHER): Payer: 59 | Admitting: Pharmacist

## 2019-11-07 VITALS — BP 91/56 | HR 83

## 2019-11-07 DIAGNOSIS — M0579 Rheumatoid arthritis with rheumatoid factor of multiple sites without organ or systems involvement: Secondary | ICD-10-CM

## 2019-11-07 DIAGNOSIS — Z7189 Other specified counseling: Secondary | ICD-10-CM | POA: Diagnosis not present

## 2019-11-07 MED ORDER — ENBREL MINI 50 MG/ML ~~LOC~~ SOCT: 50.0000 mg | SUBCUTANEOUS | 0 refills | Status: DC

## 2019-11-07 NOTE — Patient Instructions (Addendum)
Remember the 5 C's:  COUNTER- leave on the counter at least 30 mins but up to overnight to bring medication to room temperature and prevent stinging  COLD- Placing something cold (like and ice gel pack or cold water bottle) on the injection site just before cleansing with alcohol may help reduce pain  CLARITIN- for the first two weeks of treatment or  the day of, the day before, and the day after injecting to minimize injection site reactions  CORTISONE CREAM- apply if injection site is irritated and itching  CALL ME- if injection site reaction is bigger than the size of your fist, looks infected, blisters, or develop hives  Standing Labs We placed an order today for your standing lab work.    Please come back and get your standing labs in 1 month and then every 3 months.  We have open lab daily Monday through Thursday from 8:30-12:30 PM and 1:30-4:30 PM and Friday from 8:30-12:30 PM and 1:30-4:00 PM at the office of Dr. Pollyann Savoy.   You may experience shorter wait times on Monday and Friday afternoons. The office is located at [address], [address], [place], [place] [address] No appointment is necessary.   Labs are drawn by [facility].  You may receive a bill from [facility] for your lab work.  If you wish to have your labs drawn at another location, please call the office 24 hours in advance to send orders.  If you have any questions regarding directions or hours of operation,  please call [phone].   Just as a reminder please drink plenty of water prior to coming for your lab work. Thanks!

## 2019-11-21 ENCOUNTER — Telehealth: Payer: Self-pay | Admitting: Rheumatology

## 2019-11-21 NOTE — Telephone Encounter (Signed)
Patient states she placed enbrel cartridge in autoinjector and when she received the end cap, she noticed a little medication. Patient continued with injection and when patient finished injection, the medication leaked all over her stomach. The needle did penetrate her skin and patient is unsure of how much medication she actually received. Patient has spoken with [facility] and [employer] and they are replacing the cartridge. Patient questioned if she should inject again today or wait until next weekly dose. I advised patient to not inject again this week and wait until next scheduled dose next Thursday.

## 2019-11-21 NOTE — Telephone Encounter (Signed)
Patient left a voicemail stating she took her Enbrel injection this morning and has a couple of questions.  Patient requested a return call.

## 2019-11-21 NOTE — Telephone Encounter (Signed)
FYI

## 2019-11-22 NOTE — Telephone Encounter (Signed)
[facility] called for verbal rx to replace defective cartridge. I provided verbal to pharmacist Clifton Custard.

## 2019-12-02 NOTE — Progress Notes (Signed)
Office Visit Note  Patient: Robin [name]             Date of Birth: 07-23-1986           MRN: [id number]             PCP: Patient, No Pcp Per Referring: No ref. provider found Visit Date: 12/03/2019 Occupation: @GUAROCC @  Subjective:  No chief complaint on file.   History of Present Illness: BERNARD DONAHOO is a 34 y.o. female with seropositive rheumatoid arthritis.  She was a started on Enbrel on November 07, 2019.  She has noticed improvement in her joint symptoms.  She still continues to have some morning stiffness and some joint pain and swelling.  Her right second finger and left ankle has been the most painful and swollen.  Activities of Daily Living:  Patient reports morning stiffness for 5-6 hours.   Patient Reports nocturnal pain.  Difficulty dressing/grooming: Denies Difficulty climbing stairs: Reports Difficulty getting out of chair: Denies Difficulty using hands for taps, buttons, cutlery, and/or writing: Reports  Review of Systems  Constitutional: Positive for fatigue. Negative for night sweats, weight gain and weight loss.  HENT: Negative for mouth sores, trouble swallowing, trouble swallowing, mouth dryness and nose dryness.   Eyes: Negative for pain, redness, itching, visual disturbance and dryness.  Respiratory: Negative for cough, shortness of breath, wheezing and difficulty breathing.   Cardiovascular: Negative for chest pain, palpitations, hypertension, irregular heartbeat and swelling in legs/feet.  Gastrointestinal: Negative for blood in stool, constipation and diarrhea.  Endocrine: Negative for increased urination.  Genitourinary: Negative for difficulty urinating, painful urination and vaginal dryness.  Musculoskeletal: Positive for arthralgias, joint pain, joint swelling, morning stiffness and muscle tenderness. Negative for myalgias, muscle weakness and myalgias.  Skin: Positive for hair loss. Negative for color change, rash, redness, skin tightness, ulcers  and sensitivity to sunlight.  Allergic/Immunologic: Negative for susceptible to infections.  Neurological: Positive for headaches. Negative for dizziness, numbness, memory loss, night sweats and weakness.  Hematological: Negative for bruising/bleeding tendency and swollen glands.  Psychiatric/Behavioral: Positive for sleep disturbance. Negative for depressed mood and confusion. The patient is not nervous/anxious.     PMFS History:  Patient Active Problem List   Diagnosis Date Noted  . Rheumatoid arthritis with rheumatoid factor of multiple sites without organ or systems involvement ([place]) 12/03/2019  . Normal labor 07/09/2018  . Pregnancy with history of neonatal death 05-02-18  . Depression affecting pregnancy 04/02/2018  . Irregular periods 01/26/2018  . Hereditary disease in family possibly affecting fetus, affecting management of mother, antepartum condition or complication, not applicable or unspecified fetus   . History of preterm delivery 11/17/2017  . Supervision of high risk pregnancy, antepartum 11/17/2017  . Previous cesarean section 11/17/2017  . Family history of congenital anomaly 11/17/2017  . Family history of congenital heart disease 07/27/2015  . Family hx of bicuspid aortic valve--FOB 04/03/2012    Past Medical History:  Diagnosis Date  . Abnormal Pap smear 2008   HAD HPV;HAD GENITAL WART REMOVED;LAST PAP 07/2010 WAS NORMAL  . Anemia    FeSO4 SUPP TAKEN IN PAST  . Asthma    AS A CHILD;GREW OUT OF @ 8 YOA  . Depression    after the death of her son, was on meds then  . History of smoking 12/26/2011   Reports quit '12  . Infection    UTI;CAN GET FREQ  . Rheumatoid arthritis ([place])   . Vaginal Pap smear, abnormal  Family History  Problem Relation Age of Onset  . Mitral valve prolapse Mother        DIED @ 28 YOA OF CONDITION  . Healthy Father   . Mitral valve prolapse Paternal Aunt   . Heart attack Paternal Grandfather        DECEASED  . Dementia  Maternal Grandmother   . Healthy Son   . Autism Daughter   . Healthy Daughter   . Healthy Daughter   . Healthy Daughter    Past Surgical History:  Procedure Laterality Date  . CESAREAN SECTION N/A 09/09/2016   Procedure: CESAREAN SECTION;  Surgeon: Osborne Oman, MD;  Location: [facility];  Service: Obstetrics;  Laterality: N/A;  . NASAL SINUS SURGERY  2016   Social History   Social History Narrative  . Not on file   There is no immunization history for the selected administration types on file for this patient.   Objective: Vital Signs: BP 103/71 (BP Location: Left Arm, Patient Position: Sitting, Cuff Size: Normal)   Pulse (!) 108   Resp 12   Ht 5' 5.5" (1.664 m)   Wt 112 lb (50.8 kg)   BMI 18.35 kg/m    Physical Exam Vitals and nursing note reviewed.  Constitutional:      Appearance: She is well-developed.  HENT:     Head: Normocephalic and atraumatic.  Eyes:     Conjunctiva/sclera: Conjunctivae normal.  Cardiovascular:     Rate and Rhythm: Normal rate and regular rhythm.     Heart sounds: Normal heart sounds.  Pulmonary:     Effort: Pulmonary effort is normal.     Breath sounds: Normal breath sounds.  Abdominal:     General: Bowel sounds are normal.     Palpations: Abdomen is soft.  Musculoskeletal:     Cervical back: Normal range of motion.  Lymphadenopathy:     Cervical: No cervical adenopathy.  Skin:    General: Skin is warm and dry.     Capillary Refill: Capillary refill takes less than 2 seconds.  Neurological:     Mental Status: She is alert and oriented to person, place, and time.  Psychiatric:        Behavior: Behavior normal.      Musculoskeletal Exam: She has some stiffness with range of motion of her cervical spine.  Shoulder joints with good range of motion.  She had synovitis of her right sternoclavicular joint.  She had tenderness and synovitis in multiple joints as described below.  CDAI Exam: CDAI Score: 4.8  Patient Global:  4 mm; Provider Global: 4 mm Swollen: 5 ; Tender: 6  Joint Exam 12/03/2019      Right  Left  Sternoclavicular  Swollen Tender     Wrist  Swollen Tender     MCP 2  Swollen Tender     Cervical Spine   Tender     Ankle     Swollen Tender  MTP 5  Swollen Tender        Investigation: No additional findings.  Imaging: No results found.  Recent Labs: Lab Results  Component Value Date   WBC 7.9 10/29/2019   HGB 12.8 10/29/2019   PLT 304 10/29/2019   NA 140 10/29/2019   K 4.0 10/29/2019   CL 108 10/29/2019   CO2 25 10/29/2019   GLUCOSE 85 10/29/2019   BUN 10 10/29/2019   CREATININE 0.64 10/29/2019   BILITOT 0.3 10/29/2019   AST 14 10/29/2019  ALT 14 10/29/2019   PROT 6.8 10/29/2019   PROT 6.9 10/29/2019   CALCIUM 9.5 10/29/2019   GFRAA 137 10/29/2019   QFTBGOLDPLUS NEGATIVE 10/29/2019  October 29, 2019 UA unremarkable.  SPEP normal, TB Gold negative, immunoglobulins normal, ESR 6, CK 56, TSH normal, hepatitis B-, RF 15, anti-CCP> 250, 14 3 3  eta negative, ANA negative  January 14, 2019 hepatitis C antibody negative at [facility]: No specialty comments available.  Procedures:  No procedures performed Allergies: Patient has no known allergies.   Assessment / Plan:     Visit Diagnoses: Rheumatoid arthritis with rheumatoid factor of multiple sites without organ or systems involvement ([place]) - Positive RF, positive anti-CCP, erosive disease.Started on PLQ by Dr. Flonnie Overman at [facility] after [place] on 01/14/19. MTX-discontinued due to nausea and inadequate res.  She was started on Enbrel in April.  She has noticed improvement in her symptoms.  She still has significant pain and swelling in multiple joints as described above.  I will give her another prednisone taper starting at 20 mg and taper by 5 mg every 4 days.  We will see response to Enbrel over the next 2 months.  If she has inadequate response we may add leflunomide.  High risk medication use - Enbrel 50 mg sq q week.  Start 11/07/19. - Plan: CBC with Differential/Platelet, COMPLETE METABOLIC PANEL WITH GFR today and then every 3 months.  History of preterm delivery  Pregnancy with history of neonatal death  Family hx of bicuspid aortic valve--FOB  Family history of congenital heart disease  Orders: Orders Placed This Encounter  Procedures  . CBC with Differential/Platelet  . COMPLETE METABOLIC PANEL WITH GFR   Meds ordered this encounter  Medications  . predniSONE (DELTASONE) 5 MG tablet    Sig: Take 4 tabs po qd x 4 days, 3  tabs po qd x 4 days, 2  tabs po qd x 4 days, 1  tab po qd x 4 days    Dispense:  40 tablet    Refill:  0     Follow-Up Instructions: Return in about 2 months (around 02/02/2020) for Rheumatoid arthritis.   Bo Merino, MD  Note - This record has been created using Editor, commissioning.  Chart creation errors have been sought, but may not always  have been located. Such creation errors do not reflect on  the standard of medical care.

## 2019-12-03 ENCOUNTER — Ambulatory Visit (INDEPENDENT_AMBULATORY_CARE_PROVIDER_SITE_OTHER): Payer: 59 | Admitting: Rheumatology

## 2019-12-03 ENCOUNTER — Encounter: Payer: Self-pay | Admitting: Rheumatology

## 2019-12-03 ENCOUNTER — Other Ambulatory Visit: Payer: Self-pay

## 2019-12-03 VITALS — BP 103/71 | HR 108 | Resp 12 | Ht 65.5 in | Wt 112.0 lb

## 2019-12-03 DIAGNOSIS — Z8774 Personal history of (corrected) congenital malformations of heart and circulatory system: Secondary | ICD-10-CM

## 2019-12-03 DIAGNOSIS — O09299 Supervision of pregnancy with other poor reproductive or obstetric history, unspecified trimester: Secondary | ICD-10-CM

## 2019-12-03 DIAGNOSIS — Z8751 Personal history of pre-term labor: Secondary | ICD-10-CM | POA: Diagnosis not present

## 2019-12-03 DIAGNOSIS — M0579 Rheumatoid arthritis with rheumatoid factor of multiple sites without organ or systems involvement: Secondary | ICD-10-CM

## 2019-12-03 DIAGNOSIS — Z79899 Other long term (current) drug therapy: Secondary | ICD-10-CM | POA: Diagnosis not present

## 2019-12-03 DIAGNOSIS — Z8279 Family history of other congenital malformations, deformations and chromosomal abnormalities: Secondary | ICD-10-CM

## 2019-12-03 LAB — CBC WITH DIFFERENTIAL/PLATELET
Absolute Monocytes: 700 cells/uL (ref 200–950)
Basophils Absolute: 70 cells/uL (ref 0–200)
Basophils Relative: 1 %
Eosinophils Absolute: 371 cells/uL (ref 15–500)
Eosinophils Relative: 5.3 %
HCT: 39.8 % (ref 35.0–45.0)
Hemoglobin: 13 g/dL (ref 11.7–15.5)
Lymphs Abs: 1610 cells/uL (ref 850–3900)
MCH: 29.7 pg (ref 27.0–33.0)
MCHC: 32.7 g/dL (ref 32.0–36.0)
MCV: 91.1 fL (ref 80.0–100.0)
MPV: 9.7 fL (ref 7.5–12.5)
Monocytes Relative: 10 %
Neutro Abs: 4249 cells/uL (ref 1500–7800)
Neutrophils Relative %: 60.7 %
Platelets: 361 10*3/uL (ref 140–400)
RBC: 4.37 10*6/uL (ref 3.80–5.10)
RDW: 11.7 % (ref 11.0–15.0)
Total Lymphocyte: 23 %
WBC: 7 10*3/uL (ref 3.8–10.8)

## 2019-12-03 LAB — COMPLETE METABOLIC PANEL WITH GFR
AG Ratio: 1.7 (calc) (ref 1.0–2.5)
ALT: 11 U/L (ref 6–29)
AST: 12 U/L (ref 10–30)
Albumin: 4.5 g/dL (ref 3.6–5.1)
Alkaline phosphatase (APISO): 53 U/L (ref 31–125)
BUN: 9 mg/dL (ref 7–25)
CO2: 25 mmol/L (ref 20–32)
Calcium: 8.8 mg/dL (ref 8.6–10.2)
Chloride: 105 mmol/L (ref 98–110)
Creat: 0.61 mg/dL (ref 0.50–1.10)
GFR, Est African American: 139 mL/min/{1.73_m2} (ref >=60)
GFR, Est Non African American: 120 mL/min/{1.73_m2} (ref >=60)
Globulin: 2.7 g/dL (calc) (ref 1.9–3.7)
Glucose, Bld: 82 mg/dL (ref 65–99)
Potassium: 4.6 mmol/L (ref 3.5–5.3)
Sodium: 139 mmol/L (ref 135–146)
Total Bilirubin: 0.5 mg/dL (ref 0.2–1.2)
Total Protein: 7.2 g/dL (ref 6.1–8.1)

## 2019-12-03 MED ORDER — PREDNISONE 5 MG PO TABS
ORAL_TABLET | ORAL | 0 refills | Status: DC
Start: 2019-12-03 — End: 2019-12-19

## 2019-12-03 NOTE — Patient Instructions (Signed)
Standing Labs We placed an order today for your standing lab work.    Please come back and get your standing labs in August and every 3 months   We have open lab daily Monday through Thursday from 8:30-12:30 PM and 1:30-4:30 PM and Friday from 8:30-12:30 PM and 1:30-4:00 PM at the office of Dr. Ketih Goodie.   You may experience shorter wait times on Monday and Friday afternoons. The office is located at [address], [address], [place], [place] [address] No appointment is necessary.   Labs are drawn by [facility].  You may receive a bill from [facility] for your lab work.  If you wish to have your labs drawn at another location, please call the office 24 hours in advance to send orders.  If you have any questions regarding directions or hours of operation,  please call [phone].   Just as a reminder please drink plenty of water prior to coming for your lab work. Thanks!   

## 2019-12-04 NOTE — Progress Notes (Signed)
CBC and CMP are normal.  Patient was a started on Enbrel a month ago.

## 2019-12-19 ENCOUNTER — Other Ambulatory Visit: Payer: Self-pay

## 2019-12-19 ENCOUNTER — Encounter: Payer: Self-pay | Admitting: Physician Assistant

## 2019-12-19 ENCOUNTER — Ambulatory Visit (INDEPENDENT_AMBULATORY_CARE_PROVIDER_SITE_OTHER): Payer: 59 | Admitting: Physician Assistant

## 2019-12-19 ENCOUNTER — Telehealth: Payer: Self-pay | Admitting: Rheumatology

## 2019-12-19 VITALS — BP 112/72 | HR 97 | Resp 12 | Ht 65.5 in | Wt 114.0 lb

## 2019-12-19 DIAGNOSIS — Z79899 Other long term (current) drug therapy: Secondary | ICD-10-CM

## 2019-12-19 DIAGNOSIS — O09299 Supervision of pregnancy with other poor reproductive or obstetric history, unspecified trimester: Secondary | ICD-10-CM | POA: Diagnosis not present

## 2019-12-19 DIAGNOSIS — M25472 Effusion, left ankle: Secondary | ICD-10-CM

## 2019-12-19 DIAGNOSIS — M7989 Other specified soft tissue disorders: Secondary | ICD-10-CM | POA: Diagnosis not present

## 2019-12-19 DIAGNOSIS — Z8774 Personal history of (corrected) congenital malformations of heart and circulatory system: Secondary | ICD-10-CM

## 2019-12-19 DIAGNOSIS — M0579 Rheumatoid arthritis with rheumatoid factor of multiple sites without organ or systems involvement: Secondary | ICD-10-CM | POA: Diagnosis not present

## 2019-12-19 DIAGNOSIS — Z8751 Personal history of pre-term labor: Secondary | ICD-10-CM | POA: Diagnosis not present

## 2019-12-19 DIAGNOSIS — Z8279 Family history of other congenital malformations, deformations and chromosomal abnormalities: Secondary | ICD-10-CM

## 2019-12-19 MED ORDER — LEFLUNOMIDE 20 MG PO TABS
20.0000 mg | ORAL_TABLET | Freq: Every day | ORAL | 2 refills | Status: DC
Start: 2019-12-19 — End: 2020-07-06

## 2019-12-19 MED ORDER — TRIAMCINOLONE ACETONIDE 40 MG/ML IJ SUSP
10.0000 mg | INTRAMUSCULAR | Status: AC | PRN
  Administered 2019-12-19: 10 mg via INTRA_ARTICULAR

## 2019-12-19 MED ORDER — LIDOCAINE HCL 1 % IJ SOLN
0.3000 mL | INTRAMUSCULAR | Status: AC | PRN
  Administered 2019-12-19: .3 mL

## 2019-12-19 MED ORDER — TRIAMCINOLONE ACETONIDE 40 MG/ML IJ SUSP
30.0000 mg | INTRAMUSCULAR | Status: AC | PRN
  Administered 2019-12-19: 30 mg via INTRA_ARTICULAR

## 2019-12-19 MED ORDER — LIDOCAINE HCL 1 % IJ SOLN
1.0000 mL | INTRAMUSCULAR | Status: AC | PRN
Start: 2019-12-19 — End: 2019-12-19
  Administered 2019-12-19: 1 mL

## 2019-12-19 NOTE — Telephone Encounter (Signed)
Patient states she is having pain in her index finger right hand, left ankle and under the toe of right foot. Patient states the pain has been going on for some time. Patient states she recently came off a prednisone and it has not helped. Patient states she is due to go on a camping trip next weekend. Patient scheduled for an appointment 12/19/2019 for evaluation and possible cortisone injection.

## 2019-12-19 NOTE — Progress Notes (Signed)
Office Visit Note  Patient: Robin [name]             Date of Birth: [birth year]           MRN: [id number]             PCP: Patient, No Pcp Per Referring: No ref. provider found Visit Date: 12/19/2019 Occupation: @GUAROCC @  Subjective:  Pain in multiple joints   History of Present Illness: Robin [name] is a 34 y.o. female with history of seropositive rheumatoid arthritis.  Patient is on Enbrel 50 mg subcutaneous injections every week.  Robin [name] just had her seventh Enbrel injection.  Patient states that initially Robin [name] started to notice some improvement in her stiffness and arthralgias but for the past 2 to 3 weeks has been having severe pain and inflammation in multiple joints.  Robin [name] presents today with tenderness and inflammation in the right second MCP, left ankle, and right third, fourth, and fifth MTP joints.  Robin [name] recently finished a prednisone taper which did not provide much relief. Robin [name] has not had any recent infections.  Robin [name] will not be receiving the covid-19 vaccination.   Activities of Daily Living:  Patient reports morning stiffness for 1-3 hours.   Patient Reports nocturnal pain.  Difficulty dressing/grooming: Denies Difficulty climbing stairs: Denies Difficulty getting out of chair: Denies Difficulty using hands for taps, buttons, cutlery, and/or writing: Reports  Review of Systems  Constitutional: Negative for fatigue.  HENT: Negative for mouth sores, mouth dryness and nose dryness.   Eyes: Negative for itching and dryness.  Respiratory: Negative for shortness of breath and difficulty breathing.   Cardiovascular: Negative for chest pain and palpitations.  Gastrointestinal: Negative for blood in stool, constipation and diarrhea.  Endocrine: Negative for increased urination.  Genitourinary: Negative for difficulty urinating and painful urination.  Musculoskeletal: Positive for arthralgias, joint pain, joint swelling, myalgias, morning stiffness, muscle tenderness and  myalgias.  Skin: Negative for rash and redness.  Allergic/Immunologic: Negative for susceptible to infections.  Neurological: Positive for headaches. Negative for dizziness, numbness, memory loss and weakness.  Hematological: Negative for bruising/bleeding tendency.  Psychiatric/Behavioral: Negative for confusion.    PMFS History:  Patient Active Problem List   Diagnosis Date Noted  . Rheumatoid arthritis with rheumatoid factor of multiple sites without organ or systems involvement (HCC) 12/03/2019  . Normal labor 07/09/2018  . Pregnancy with history of neonatal death 2018/05/04  . Depression affecting pregnancy 04/02/2018  . Irregular periods 01/26/2018  . Hereditary disease in family possibly affecting fetus, affecting management of mother, antepartum condition or complication, not applicable or unspecified fetus   . History of preterm delivery 11/17/2017  . Supervision of high risk pregnancy, antepartum 11/17/2017  . Previous cesarean section 11/17/2017  . Family history of congenital anomaly 11/17/2017  . Family history of congenital heart disease 07/27/2015  . Family hx of bicuspid aortic valve--FOB 04/03/2012    Past Medical History:  Diagnosis Date  . Abnormal Pap smear 2008   HAD HPV;HAD GENITAL WART REMOVED;LAST PAP 07/2010 WAS NORMAL  . Anemia    FeSO4 SUPP TAKEN IN PAST  . Asthma    AS A CHILD;GREW OUT OF @ 8 YOA  . Depression    after the death of her son, was on meds then  . History of smoking 12/26/2011   Reports quit '12  . Infection    UTI;CAN GET FREQ  . Rheumatoid arthritis (HCC)   . Vaginal Pap smear, abnormal     Family History  Problem Relation Age of Onset  . Mitral valve prolapse Mother        DIED @ 51 YOA OF CONDITION  . Healthy Father   . Mitral valve prolapse Paternal Aunt   . Heart attack Paternal Grandfather        DECEASED  . Dementia Maternal Grandmother   . Healthy Son   . Autism Daughter   . Healthy Daughter   . Healthy Daughter   .  Healthy Daughter    Past Surgical History:  Procedure Laterality Date  . CESAREAN SECTION N/A 09/09/2016   Procedure: CESAREAN SECTION;  Surgeon: Tereso Newcomer, MD;  Location: [facility];  Service: Obstetrics;  Laterality: N/A;  . NASAL SINUS SURGERY  2016   Social History   Social History Narrative  . Not on file   There is no immunization history for the selected administration types on file for this patient.   Objective: Vital Signs: BP 112/72 (BP Location: Right Arm, Patient Position: Sitting, Cuff Size: Normal)   Pulse 97   Resp 12   Ht 5' 5.5" (1.664 m)   Wt 114 lb (51.7 kg)   BMI 18.68 kg/m    Physical Exam Vitals and nursing note reviewed.  Constitutional:      Appearance: Robin [name] is well-developed.  HENT:     Head: Normocephalic and atraumatic.  Eyes:     Conjunctiva/sclera: Conjunctivae normal.  Pulmonary:     Effort: Pulmonary effort is normal.  Abdominal:     General: Bowel sounds are normal.     Palpations: Abdomen is soft.  Musculoskeletal:     Cervical back: Normal range of motion.  Lymphadenopathy:     Cervical: No cervical adenopathy.  Skin:    General: Skin is warm and dry.     Capillary Refill: Capillary refill takes less than 2 seconds.  Neurological:     Mental Status: Robin [name] is alert and oriented to person, place, and time.  Psychiatric:        Behavior: Behavior normal.      Musculoskeletal Exam: C-spine painful ROM.  Thoracic and lumbar spine good ROM.  Shoulder joints, elbow joints, wrist joints good ROM with no synovitis.  Tenderness and synovitis of the right 2nd MCP joint.  Hip joints and knee joints have good ROM with no discomfort.  No warmth or effusion of knee joints.  Tenderness and synovitis of the left ankle joint.  Tenderness and synovitis of the right 3rd, 4th, and 5th MTP joints.   CDAI Exam: CDAI Score: 3.4  Patient Global: 7 mm; Provider Global: 7 mm Swollen: 5 ; Tender: 5  Joint Exam 12/19/2019      Right  Left   MCP 2  Swollen Tender     Ankle     Swollen Tender  MTP 3  Swollen Tender     MTP 4  Swollen Tender     MTP 5  Swollen Tender      There is currently no information documented on the homunculus. Go to the Rheumatology activity and complete the homunculus joint exam.  Investigation: No additional findings.  Imaging: No results found.  Recent Labs: Lab Results  Component Value Date   WBC 7.0 12/03/2019   HGB 13.0 12/03/2019   PLT 361 12/03/2019   NA 139 12/03/2019   K 4.6 12/03/2019   CL 105 12/03/2019   CO2 25 12/03/2019   GLUCOSE 82 12/03/2019   BUN 9 12/03/2019   CREATININE 0.61 12/03/2019  BILITOT 0.5 12/03/2019   AST 12 12/03/2019   ALT 11 12/03/2019   PROT 7.2 12/03/2019   CALCIUM 8.8 12/03/2019   GFRAA 139 12/03/2019   QFTBGOLDPLUS NEGATIVE 10/29/2019    Speciality Comments: No specialty comments available.  Procedures:  Medium Joint Inj: L ankle on 12/19/2019 4:23 PM Indications: pain and joint swelling Details: 27 G 1.5 in needle, ultrasound-guided Medications: 30 mg triamcinolone acetonide 40 MG/ML; 1 mL lidocaine 1 % Aspirate: 0 mL Outcome: tolerated well, no immediate complications Procedure, treatment alternatives, risks and benefits explained, specific risks discussed. Consent was given by the patient. Immediately prior to procedure a time out was called to verify the correct patient, procedure, equipment, support staff and site/side marked as required. Patient was prepped and draped in the usual sterile fashion.   Small Joint Inj: R index MCP on 12/19/2019 4:37 PM Indications: pain and joint swelling Details: 27 G needle, ultrasound-guided dorsal approach  Spinal Needle: No  Medications: 0.3 mL lidocaine 1 %; 10 mg triamcinolone acetonide 40 MG/ML Aspirate: 0 mL Outcome: tolerated well, no immediate complications Procedure, treatment alternatives, risks and benefits explained, specific risks discussed. Consent was given by the patient. Immediately  prior to procedure a time out was called to verify the correct patient, procedure, equipment, support staff and site/side marked as required. Patient was prepped and draped in the usual sterile fashion.     Allergies: Patient has no known allergies.   Assessment / Plan:     Visit Diagnoses: Rheumatoid arthritis with rheumatoid factor of multiple sites without organ or systems involvement [facility]): Robin [name] has tenderness and synovitis of the right second MCP, left ankle joint, and right third, fourth, and fifth MTP joints.  Robin [name] has been experiencing significant pain and inflammation in multiple joints over the past 2 to 3 weeks.  Robin [name] recently completed a prednisone taper but did not notice much improvement in her symptoms while taking it.  Robin [name] was started on Enbrel on 11/07/2019.  Robin [name] had her seventh dose this week.  Robin [name] did necessarily notice an improvement in her joint stiffness but has since then been having recurrent flares.  We discussed adding on Arava 20 mg 1 tablet by mouth daily as combination therapy.  Indications, contraindications, potential side effects of Arava were discussed today.  All questions were addressed and consent was obtained.  A prescription for ray will be sent to the pharmacy today.  Robin [name] will continue on Enbrel as prescribed.  Robin [name] will follow-up in the office in 6 weeks to assess her response.  Medication counseling:   Baseline Immunosuppressant Therapy Labs  Quantiferon TB Gold Latest Ref Rng & Units 10/29/2019  Quantiferon TB Gold Plus NEGATIVE NEGATIVE    Hepatitis Latest Ref Rng & Units 10/29/2019  Hep B Surface Ag NON-REACTI NON-REACTIVE  Hep B IgM NON-REACTI NON-REACTIVE    Lab Results  Component Value Date   HIV NON-REACTIVE 04/27/2018   HIV NON-REACTIVE 12/01/2017   HIV Non Reactive 09/06/2016   HIV NONREACTIVE 05/04/2016   HIV NONREACTIVE 09/07/2015    Immunoglobulin Electrophoresis Latest Ref Rng & Units 10/29/2019  IgA  47 - 310 mg/dL 169  IgG 678 - 9,381  mg/dL 0,175  IgM 50 - 102 mg/dL 98    Serum Protein Electrophoresis Latest Ref Rng & Units 12/03/2019  Total Protein 6.1 - 8.1 g/dL 7.2  Albumin 3.8 - 4.8 g/dL -  Alpha-1 0.2 - 0.3 g/dL -  Alpha-2 0.5 - 0.9 g/dL -  Beta Globulin 0.4 - 0.6  g/dL -  Beta 2 0.2 - 0.5 g/dL -  Gamma Globulin 0.8 - 1.7 g/dL -    No results found for: G6PDH  No results found for: TPMT   Patient was counseled on the purpose, proper use, and adverse effects of leflunomide including risk of infection, nausea/diarrhea/weight loss, increase in blood pressure, rash, hair loss, tingling in the hands and feet, and signs and symptoms of interstitial lung disease.   Also counseled on Black Box warning of liver injury and importance of avoiding alcohol while on therapy. Discussed that there is the possibility of an increased risk of malignancy but it is not well understood if this increased risk is due to the medication or the disease state.  Counseled patient to avoid live vaccines. Recommend annual influenza, Pneumovax 23, Prevnar 13, and Shingrix as indicated.   Discussed the importance of frequent monitoring of liver function and blood count.  Standing orders placed.  Discussed importance of birth control while on leflunomide due to risk of congenital abnormalities, and patient confirms her husband had a vasectomy.  Provided patient with educational materials on leflunomide and answered all questions.  Patient consented to [place] use, and consent will be uploaded into the media tab.   Patient dose will be 20 mg 1 tablet by mouth daily.   High risk medication use: Enbrel 50 mg sq injections every week-started on 11/07/2019.  We will be starting her on Arava 20 mg 1 tablet by mouth daily.  Robin [name] will return for labs in 2 weeks x 2 then 2 months and every 3 months.  Standing orders are in place.  Robin [name] discontinued methotrexate in the past due to GI side effects.  Robin [name] had an inadequate response to Plaquenil.  CBC and CMP were within  normal limits on 12/03/2019.  TB gold negative on 10/29/2019.  Robin [name] has not had any recent infections and does not plan on receiving the COVID-19 vaccination.  Other medical conditions are listed as follows:  History of preterm delivery  Pregnancy with history of neonatal death  Family hx of bicuspid aortic valve--FOB  Family history of congenital heart disease  Orders: Orders Placed This Encounter  Procedures  . Medium Joint Inj: L ankle  . Small Joint Inj: R index MCP   Meds ordered this encounter  Medications  . leflunomide (ARAVA) 20 MG tablet    Sig: Take 1 tablet (20 mg total) by mouth daily.    Dispense:  30 tablet    Refill:  2    Face-to-face time spent with patient was 30 minutes. Greater than 50% of time was spent in counseling and coordination of care.  Follow-Up Instructions: Return in about 6 weeks (around 01/30/2020) for Rheumatoid arthritis.   Ofilia Neas, PA-C  Note - This record has been created using Dragon software.  Chart creation errors have been sought, but may not always  have been located. Such creation errors do not reflect on  the standard of medical care.

## 2019-12-19 NOTE — Patient Instructions (Addendum)
Standing Labs We placed an order today for your standing lab work.    Please come back and get your standing labs in 2 weeks x2, 2 months, then every 3 months   We have open lab daily Monday through Thursday from 8:30-12:30 PM and 1:30-4:30 PM and Friday from 8:30-12:30 PM and 1:30-4:00 PM at the office of Dr. Shaili Deveshwar.   You may experience shorter wait times on Monday and Friday afternoons. The office is located at [address], [address], [place], [place] [address] No appointment is necessary.   Labs are drawn by [facility].  You may receive a bill from [facility] for your lab work.  If you wish to have your labs drawn at another location, please call the office 24 hours in advance to send orders.  If you have any questions regarding directions or hours of operation,  please call [phone].   Just as a reminder please drink plenty of water prior to coming for your lab work. Thanks!   Leflunomide tablets What is this medicine? LEFLUNOMIDE (le FLOO na mide) is for rheumatoid arthritis. This medicine may be used for other purposes; ask your health care provider or pharmacist if you have questions. COMMON BRAND NAME(S): Arava What should I tell my health care provider before I take this medicine? They need to know if you have any of these conditions:  diabetes  have a fever or infection  high blood pressure  immune system problems  kidney disease  liver disease  low blood cell counts, like low white cell, platelet, or red cell counts  lung or breathing disease, like asthma  recently received or scheduled to receive a vaccine  receiving treatment for cancer  skin conditions or sensitivity  tingling of the fingers or toes, or other nerve disorder  tuberculosis  an unusual or allergic reaction to leflunomide, teriflunomide, other medicines, food, dyes, or preservatives  pregnant or trying to get pregnant  breast-feeding How should I use this  medicine? Take this medicine by mouth with a full glass of water. Follow the directions on the prescription label. Take your medicine at regular intervals. Do not take your medicine more often than directed. Do not stop taking except on your doctor's advice. Talk to your pediatrician regarding the use of this medicine in children. Special care may be needed. Overdosage: If you think you have taken too much of this medicine contact a poison control center or emergency room at once. NOTE: This medicine is only for you. Do not share this medicine with others. What if I miss a dose? If you miss a dose, take it as soon as you can. If it is almost time for your next dose, take only that dose. Do not take double or extra doses. What may interact with this medicine? Do not take this medicine with any of the following medications:  teriflunomide This medicine may also interact with the following medications:  alosetron  birth control pills  caffeine  cefaclor  certain medicines for diabetes like nateglinide, repaglinide, rosiglitazone, pioglitazone  certain medicines for high cholesterol like atorvastatin, pravastatin, rosuvastatin, simvastatin  charcoal  cholestyramine  ciprofloxacin  duloxetine  furosemide  ketoprofen  live virus vaccines  medicines that increase your risk for infection  methotrexate  mitoxantrone  paclitaxel  penicillin  theophylline  tizanidine  warfarin This list may not describe all possible interactions. Give your health care provider a list of all the medicines, herbs, non-prescription drugs, or dietary supplements you use. Also tell   them if you smoke, drink alcohol, or use illegal drugs. Some items may interact with your medicine. What should I watch for while using this medicine? Visit your health care provider for regular checks on your progress. Tell your doctor or health care provider if your symptoms do not start to get better or if they  get worse. You may need blood work done while you are taking this medicine. This medicine may cause serious skin reactions. They can happen weeks to months after starting the medicine. Contact your health care provider right away if you notice fevers or flu-like symptoms with a rash. The rash may be red or purple and then turn into blisters or peeling of the skin. Or, you might notice a red rash with swelling of the face, lips or lymph nodes in your neck or under your arms. This medicine may stay in your body for up to 2 years after your last dose. Tell your doctor about any unusual side effects or symptoms. A medicine can be given to help lower your blood levels of this medicine more quickly. Women must use effective birth control with this medicine. There is a potential for serious side effects to an unborn child. Do not become pregnant while taking this medicine. Inform your doctor if you wish to become pregnant. This medicine remains in your blood after you stop taking it. You must continue using effective birth control until the blood levels have been checked and they are low enough. A medicine can be given to help lower your blood levels of this medicine more quickly. Immediately talk to your doctor if you think you may be pregnant. You may need a pregnancy test. Talk to your health care provider or pharmacist for more information. You should not receive certain vaccines during your treatment and for a certain time after your treatment with this medication ends. Talk to your health care provider for more information. What side effects may I notice from receiving this medicine? Side effects that you should report to your doctor or health care professional as soon as possible:  allergic reactions like skin rash, itching or hives, swelling of the face, lips, or tongue  breathing problems  cough  increased blood pressure  low blood counts - this medicine may decrease the number of white blood cells  and platelets. You may be at increased risk for infections and bleeding.  pain, tingling, numbness in the hands or feet  rash, fever, and swollen lymph nodes  redness, blistering, peeing or loosening of the skin, including inside the mouth  signs of decreased platelets or bleeding - bruising, pinpoint red spots on the skin, black, tarry stools, blood in urine  signs of infection - fever or chills, cough, sore throat, pain or trouble passing urine  signs and symptoms of liver injury like dark yellow or brown urine; general ill feeling or flu-like symptoms; light-colored stools; loss of appetite; nausea; right upper belly pain; unusually weak or tired; yellowing of the eyes or skin  trouble passing urine or change in the amount of urine  vomiting Side effects that usually do not require medical attention (report to your doctor or health care professional if they continue or are bothersome):  diarrhea  hair thinning or loss  headache  nausea  tiredness This list may not describe all possible side effects. Call your doctor for medical advice about side effects. You may report side effects to FDA at 1-800-FDA-1088. Where should I keep my medicine? Keep out of   the reach of children. Store at room temperature between 15 and 30 degrees C (59 and 86 degrees F). Protect from moisture and light. Throw away any unused medicine after the expiration date. NOTE: This sheet is a summary. It may not cover all possible information. If you have questions about this medicine, talk to your doctor, pharmacist, or health care provider.  2020 Elsevier/Gold Standard (2018-10-12 15:06:48)  

## 2019-12-19 NOTE — Telephone Encounter (Signed)
Patient calling because she is experiencing right hand pain, lt ankle, and rt foot pain. Prednisone dose pack did not help. Patient would like to get injections, if possible. Per patient right foot is so swollen she is having trouble putting a shoe on. Please call to advise.

## 2020-01-06 ENCOUNTER — Telehealth: Payer: Self-pay | Admitting: Pharmacy Technician

## 2020-01-06 NOTE — Telephone Encounter (Signed)
Submitted a Prior Authorization request to [facility] for ENBREL via Cover My Meds. Will update once we receive a response.   (KeyLurlean Leyden) - [id number]

## 2020-01-07 NOTE — Telephone Encounter (Signed)
Received a fax regarding Prior Authorization from [employer] for ENBREL. Authorization has been DENIED. Letter with denial reasoning will be sent to the office.   Submitted a Appeal request to [employer] for ENBREL via Cover My Meds. Will update once we receive a response.  (Key: [id number])   Phone# [phone]

## 2020-01-07 NOTE — Telephone Encounter (Signed)
Received notification from [facility] regarding a prior authorization for ENBREL. Authorization has been APPROVED from 12/08/19 to 01/06/21.   Authorization # [id number] Phone # [phone]

## 2020-01-10 ENCOUNTER — Other Ambulatory Visit: Payer: Self-pay | Admitting: Rheumatology

## 2020-01-10 DIAGNOSIS — M0579 Rheumatoid arthritis with rheumatoid factor of multiple sites without organ or systems involvement: Secondary | ICD-10-CM

## 2020-01-14 ENCOUNTER — Other Ambulatory Visit: Payer: Self-pay | Admitting: Rheumatology

## 2020-01-14 DIAGNOSIS — M0579 Rheumatoid arthritis with rheumatoid factor of multiple sites without organ or systems involvement: Secondary | ICD-10-CM

## 2020-01-23 DIAGNOSIS — Z419 Encounter for procedure for purposes other than remedying health state, unspecified: Secondary | ICD-10-CM | POA: Diagnosis not present

## 2020-01-24 ENCOUNTER — Telehealth: Payer: Self-pay | Admitting: Rheumatology

## 2020-01-24 NOTE — Telephone Encounter (Signed)
Patient advised we denied the prescription as it was to early to fill. Patient will contact the office when she is using her second to last pen and refill to be sent to the pharmacy.

## 2020-01-24 NOTE — Telephone Encounter (Signed)
Patient called stating she received a call from [facility] stating her prescription refill of Enbrel was denied by Dr. Corliss Skains and instructed her to call the office.  Patient is requesting a return call.

## 2020-01-25 DIAGNOSIS — U071 COVID-19: Secondary | ICD-10-CM

## 2020-01-25 HISTORY — DX: COVID-19: U07.1

## 2020-01-31 ENCOUNTER — Ambulatory Visit: Payer: 59 | Admitting: Rheumatology

## 2020-02-06 ENCOUNTER — Telehealth: Payer: Self-pay | Admitting: Rheumatology

## 2020-02-06 DIAGNOSIS — M0579 Rheumatoid arthritis with rheumatoid factor of multiple sites without organ or systems involvement: Secondary | ICD-10-CM

## 2020-02-06 MED ORDER — ENBREL MINI 50 MG/ML ~~LOC~~ SOCT: 50.0000 mg | SUBCUTANEOUS | 0 refills | Status: DC

## 2020-02-06 NOTE — Telephone Encounter (Signed)
Last Visit: 12/19/2019 Next Visit: due July 2021 Labs: 12/03/2019 CBC and CMP are normal.  TB Gold: 10/29/2019 Neg   Current Dose per office note 12/19/2019: Enbrel 50 mg sq injections every week  [id number] arthritis  Okay to refill per Dr. Corliss Skains

## 2020-02-06 NOTE — Telephone Encounter (Signed)
Patient called requesting prescription refill of Enbrel to be sent to [facility].

## 2020-02-07 NOTE — Progress Notes (Deleted)
Office Visit Note  Patient: Robin [name]             Date of Birth: 27-Apr-1986           MRN: [id number]             PCP: Patient, No Pcp Per Referring: No ref. provider found Visit Date: 02/21/2020 Occupation: @GUAROCC @  Subjective:  No chief complaint on file.   History of Present Illness: Robin [name] is a 34 y.o. female ***   Activities of Daily Living:  Patient reports morning stiffness for *** {minute/hour:19697}.   Patient {ACTIONS;DENIES/REPORTS:21021675::"Denies"} nocturnal pain.  Difficulty dressing/grooming: {ACTIONS;DENIES/REPORTS:21021675::"Denies"} Difficulty climbing stairs: {ACTIONS;DENIES/REPORTS:21021675::"Denies"} Difficulty getting out of chair: {ACTIONS;DENIES/REPORTS:21021675::"Denies"} Difficulty using hands for taps, buttons, cutlery, and/or writing: {ACTIONS;DENIES/REPORTS:21021675::"Denies"}  No Rheumatology ROS completed.   PMFS History:  Patient Active Problem List   Diagnosis Date Noted  . Rheumatoid arthritis with rheumatoid factor of multiple sites without organ or systems involvement (HCC) 12/03/2019  . Normal labor 07/09/2018  . Pregnancy with history of neonatal death May 25, 2018  . Depression affecting pregnancy 04/02/2018  . Irregular periods 01/26/2018  . Hereditary disease in family possibly affecting fetus, affecting management of mother, antepartum condition or complication, not applicable or unspecified fetus   . History of preterm delivery 11/17/2017  . Supervision of high risk pregnancy, antepartum 11/17/2017  . Previous cesarean section 11/17/2017  . Family history of congenital anomaly 11/17/2017  . Family history of congenital heart disease 07/27/2015  . Family hx of bicuspid aortic valve--FOB 04/03/2012    Past Medical History:  Diagnosis Date  . Abnormal Pap smear 2008   HAD HPV;HAD GENITAL WART REMOVED;LAST PAP 07/2010 WAS NORMAL  . Anemia    FeSO4 SUPP TAKEN IN PAST  . Asthma    AS A CHILD;GREW OUT OF @ 8 YOA  .  Depression    after the death of her son, was on meds then  . History of smoking 12/26/2011   Reports quit '12  . Infection    UTI;CAN GET FREQ  . Rheumatoid arthritis (HCC)   . Vaginal Pap smear, abnormal     Family History  Problem Relation Age of Onset  . Mitral valve prolapse Mother        DIED @ 87 YOA OF CONDITION  . Healthy Father   . Mitral valve prolapse Paternal Aunt   . Heart attack Paternal Grandfather        DECEASED  . Dementia Maternal Grandmother   . Healthy Son   . Autism Daughter   . Healthy Daughter   . Healthy Daughter   . Healthy Daughter    Past Surgical History:  Procedure Laterality Date  . CESAREAN SECTION N/A 09/09/2016   Procedure: CESAREAN SECTION;  Surgeon: 09/11/2016, MD;  Location: [facility];  Service: Obstetrics;  Laterality: N/A;  . NASAL SINUS SURGERY  2016   Social History   Social History Narrative  . Not on file   There is no immunization history for the selected administration types on file for this patient.   Objective: Vital Signs: There were no vitals taken for this visit.   Physical Exam   Musculoskeletal Exam: ***  CDAI Exam: CDAI Score: -- Patient Global: --; Provider Global: -- Swollen: --; Tender: -- Joint Exam 02/21/2020   No joint exam has been documented for this visit   There is currently no information documented on the homunculus. Go to the Rheumatology activity and complete the homunculus joint  exam.  Investigation: No additional findings.  Imaging: No results found.  Recent Labs: Lab Results  Component Value Date   WBC 7.0 12/03/2019   HGB 13.0 12/03/2019   PLT 361 12/03/2019   NA 139 12/03/2019   K 4.6 12/03/2019   CL 105 12/03/2019   CO2 25 12/03/2019   GLUCOSE 82 12/03/2019   BUN 9 12/03/2019   CREATININE 0.61 12/03/2019   BILITOT 0.5 12/03/2019   AST 12 12/03/2019   ALT 11 12/03/2019   PROT 7.2 12/03/2019   CALCIUM 8.8 12/03/2019   GFRAA 139 12/03/2019   QFTBGOLDPLUS  NEGATIVE 10/29/2019    Speciality Comments: No specialty comments available.  Procedures:  No procedures performed Allergies: Patient has no known allergies.   Assessment / Plan:     Visit Diagnoses: No diagnosis found.  Orders: No orders of the defined types were placed in this encounter.  No orders of the defined types were placed in this encounter.   Face-to-face time spent with patient was *** minutes. Greater than 50% of time was spent in counseling and coordination of care.  Follow-Up Instructions: No follow-ups on file.   Gearldine Bienenstock, PA-C  Note - This record has been created using Dragon software.  Chart creation errors have been sought, but may not always  have been located. Such creation errors do not reflect on  the standard of medical care.

## 2020-02-13 ENCOUNTER — Telehealth: Payer: Self-pay | Admitting: Rheumatology

## 2020-02-13 NOTE — Telephone Encounter (Signed)
Patient due for Enbrel injection today, but tested positive for Covid on an at home test last night. Patient going to doctor today. Does patient need to hold off on injections? If so, for how long?

## 2020-02-13 NOTE — Telephone Encounter (Signed)
I returned patient's call.  I advised her to hold off Enbrel and Arava for approximately 1 month.  If she is asymptomatic after 1 month then she can resume her medications.  She has not received COVID-19 vaccine.  I have advised her to contact me when she receives COVID-19 vaccine in the future for recommendations about holding medications.  I also advised her to contact her PCP or go to the emergency room if her symptoms get worse as she is immunosuppressed.

## 2020-02-14 ENCOUNTER — Emergency Department (INDEPENDENT_AMBULATORY_CARE_PROVIDER_SITE_OTHER)
Admission: EM | Admit: 2020-02-14 | Discharge: 2020-02-14 | Disposition: A | Discharge status: Home / Self Care | Payer: 59 | Source: Home / Self Care

## 2020-02-14 ENCOUNTER — Emergency Department (INDEPENDENT_AMBULATORY_CARE_PROVIDER_SITE_OTHER): Payer: 59

## 2020-02-14 ENCOUNTER — Other Ambulatory Visit: Payer: Self-pay

## 2020-02-14 DIAGNOSIS — R52 Pain, unspecified: Secondary | ICD-10-CM | POA: Diagnosis not present

## 2020-02-14 DIAGNOSIS — D72819 Decreased white blood cell count, unspecified: Secondary | ICD-10-CM

## 2020-02-14 DIAGNOSIS — R509 Fever, unspecified: Secondary | ICD-10-CM | POA: Diagnosis not present

## 2020-02-14 DIAGNOSIS — R1032 Left lower quadrant pain: Secondary | ICD-10-CM

## 2020-02-14 DIAGNOSIS — M545 Low back pain, unspecified: Secondary | ICD-10-CM

## 2020-02-14 DIAGNOSIS — M549 Dorsalgia, unspecified: Secondary | ICD-10-CM | POA: Diagnosis not present

## 2020-02-14 DIAGNOSIS — N9489 Other specified conditions associated with female genital organs and menstrual cycle: Secondary | ICD-10-CM

## 2020-02-14 LAB — POCT URINALYSIS DIP (MANUAL ENTRY)
Bilirubin, UA: NEGATIVE
Blood, UA: NEGATIVE
Glucose, UA: NEGATIVE mg/dL
Ketones, POC UA: NEGATIVE mg/dL
Leukocytes, UA: NEGATIVE
Nitrite, UA: NEGATIVE
Protein Ur, POC: NEGATIVE mg/dL
Spec Grav, UA: 1.025 (ref 1.010–1.025)
Urobilinogen, UA: 0.2 E.U./dL
pH, UA: 5.5 (ref 5.0–8.0)

## 2020-02-14 LAB — POCT CBC W AUTO DIFF (K'VILLE URGENT CARE)

## 2020-02-14 LAB — POCT URINE PREGNANCY: Preg Test, Ur: NEGATIVE

## 2020-02-14 MED ORDER — KETOROLAC TROMETHAMINE 60 MG/2ML IM SOLN
60.0000 mg | Freq: Once | INTRAMUSCULAR | Status: AC
  Administered 2020-02-14: 60 mg via INTRAMUSCULAR

## 2020-02-14 MED ORDER — IOHEXOL 300 MG/ML  SOLN
100.0000 mL | Freq: Once | INTRAMUSCULAR | Status: AC | PRN
  Administered 2020-02-14: 100 mL via INTRAVENOUS

## 2020-02-14 MED ORDER — ONDANSETRON 4 MG PO TBDP
4.0000 mg | ORAL_TABLET | Freq: Once | ORAL | Status: AC
  Administered 2020-02-14: 4 mg via ORAL

## 2020-02-14 NOTE — ED Provider Notes (Signed)
[facility]    CSN: [id number] Arrival date & time: 02/14/20  0915      History   Chief Complaint Chief Complaint  Patient presents with  . COVID Test    HPI Robin [name] is a 34 y.o. female.   HPI Robin [name] is a 34 y.o. female presenting to UC with c/o generalized HA that is aching and sore the last 3 days, associated bilateral lower back pain and LLQ abdominal pain.  Mild burning after urination at times. Fever Tmax 101*F, chills, body aches and nausea. She has been taking Tylenol with mild temporary relief but no medication PTA. No cough, congestion, chest pain or SOB. No loss of taste or smell. She took an OTC Covid test, which was positive. She notes her husband just completed Covid quarantine the beginning of this week. Her 74mo old daughter had rhinorrhea and fussiness, her OTC Covid test was also positive. The pediatrician did not recommend further testing but advised to treat the home test as accurate due to known exposure from the father. Pt is requesting a PCR test because she is flying [employer] out of the country in [place] and will need proof of a positive Covid PCR and clearance from her PCP or a negative PCR now and another negative test 3 days before she flies.     Past Medical History:  Diagnosis Date  . Abnormal Pap smear 2008   HAD HPV;HAD GENITAL WART REMOVED;LAST PAP 07/2010 WAS NORMAL  . Anemia    FeSO4 SUPP TAKEN IN PAST  . Asthma    AS A CHILD;GREW OUT OF @ 8 YOA  . Depression    after the death of her son, was on meds then  . History of smoking 12/26/2011   Reports quit '12  . Infection    UTI;CAN GET FREQ  . Rheumatoid arthritis (HCC)   . Vaginal Pap smear, abnormal     Patient Active Problem List   Diagnosis Date Noted  . Rheumatoid arthritis with rheumatoid factor of multiple sites without organ or systems involvement (HCC) 12/03/2019  . Normal labor 07/09/2018  . Pregnancy with history of neonatal death 05-13-18  . Depression  affecting pregnancy 04/02/2018  . Irregular periods 01/26/2018  . Hereditary disease in family possibly affecting fetus, affecting management of mother, antepartum condition or complication, not applicable or unspecified fetus   . History of preterm delivery 11/17/2017  . Supervision of high risk pregnancy, antepartum 11/17/2017  . Previous cesarean section 11/17/2017  . Family history of congenital anomaly 11/17/2017  . Family history of congenital heart disease 07/27/2015  . Family hx of bicuspid aortic valve--FOB 04/03/2012    Past Surgical History:  Procedure Laterality Date  . CESAREAN SECTION N/A 09/09/2016   Procedure: CESAREAN SECTION;  Surgeon: Tereso Newcomer, MD;  Location: [facility];  Service: Obstetrics;  Laterality: N/A;  . NASAL SINUS SURGERY  2016    OB History    Gravida  6   Para  5   Term  4   Preterm  1   AB  1   Living  4     SAB  1   TAB      Ectopic      Multiple  0   Live Births  5            Home Medications    Prior to Admission medications   Medication Sig Start Date End Date Taking? Authorizing Provider  Etanercept (  ENBREL MINI) 50 MG/ML SOCT Inject 50 mg into the skin once a week. 02/06/20  Yes Deveshwar, Janalyn RouseShaili, MD  cetirizine (ZYRTEC) 10 MG tablet Take 10 mg by mouth daily.    [provider]  diphenhydrAMINE HCl (BENADRYL ALLERGY PO) Take by mouth as needed.    [provider]  leflunomide (ARAVA) 20 MG tablet Take 1 tablet (20 mg total) by mouth daily. 12/19/19   Gearldine Bienenstockale, Taylor M, PA-C    Family History Family History  Problem Relation Age of Onset  . Mitral valve prolapse Mother        DIED @ 3830 YOA OF CONDITION  . Healthy Father   . Mitral valve prolapse Paternal Aunt   . Heart attack Paternal Grandfather        DECEASED  . Dementia Maternal Grandmother   . Healthy Son   . Autism Daughter   . Healthy Daughter   . Healthy Daughter   . Healthy Daughter     Social History Social History     Tobacco Use  . Smoking status: Former Smoker    Packs/day: 0.50    Years: 6.00    Pack years: 3.00    Types: Cigarettes    Quit date: 06/27/2011    Years since quitting: 8.6  . Smokeless tobacco: Never Used  Vaping Use  . Vaping Use: Never used  Substance Use Topics  . Alcohol use: Yes    Comment: occ  . Drug use: No     Allergies   Patient has no known allergies.   Review of Systems Review of Systems  Constitutional: Positive for chills, fatigue and fever.  HENT: Negative for congestion, ear pain, sore throat, trouble swallowing and voice change.   Respiratory: Negative for cough and shortness of breath.   Cardiovascular: Negative for chest pain and palpitations.  Gastrointestinal: Positive for nausea. Negative for abdominal pain, diarrhea and vomiting.  Genitourinary: Positive for dysuria (minimal) and flank pain. Negative for decreased urine volume, frequency, hematuria and urgency.  Musculoskeletal: Positive for arthralgias, back pain and myalgias.  Skin: Negative for rash.  Neurological: Positive for headaches. Negative for dizziness and light-headedness.  All other systems reviewed and are negative.    Physical Exam Triage Vital Signs ED Triage Vitals  Enc Vitals Group     BP 02/14/20 0932 105/73     Pulse Rate 02/14/20 0932 93     Resp 02/14/20 0932 16     Temp 02/14/20 0932 98.2 F (36.8 C)     Temp Source 02/14/20 0932 Oral     SpO2 02/14/20 0932 99 %     Weight --      Height --      Head Circumference --      Peak Flow --      Pain Score 02/14/20 0929 7     Pain Loc --      Pain Edu? --      Excl. in GC? --    No data found.  Updated Vital Signs BP 105/73 (BP Location: Right Arm)   Pulse 93   Temp 98.2 F (36.8 C) (Oral)   Resp 16   LMP 01/31/2020   SpO2 99%   Visual Acuity Right Eye Distance:   Left Eye Distance:   Bilateral Distance:    Right Eye Near:   Left Eye Near:    Bilateral Near:     Physical Exam Vitals and nursing  note reviewed.  Constitutional:  General: She is not in acute distress.    Appearance: Normal appearance. She is well-developed. She is not ill-appearing or toxic-appearing.  HENT:     Head: Normocephalic and atraumatic.     Right Ear: Tympanic membrane and ear canal normal.     Left Ear: Tympanic membrane and ear canal normal.     Nose: Nose normal.     Right Sinus: No maxillary sinus tenderness or frontal sinus tenderness.     Left Sinus: No maxillary sinus tenderness or frontal sinus tenderness.     Mouth/Throat:     Lips: Pink.     Mouth: Mucous membranes are moist.     Pharynx: Oropharynx is clear. Uvula midline.  Cardiovascular:     Rate and Rhythm: Normal rate and regular rhythm.  Pulmonary:     Effort: Pulmonary effort is normal. No respiratory distress.     Breath sounds: Normal breath sounds. No stridor. No wheezing, rhonchi or rales.  Abdominal:     Palpations: Abdomen is soft.     Tenderness: There is abdominal tenderness. There is left CVA tenderness. There is no right CVA tenderness.    Musculoskeletal:        General: Normal range of motion.     Cervical back: Normal range of motion.  Skin:    General: Skin is warm and dry.  Neurological:     Mental Status: She is alert and oriented to person, place, and time.  Psychiatric:        Behavior: Behavior normal.      UC Treatments / Results  Labs (all labs ordered are listed, but only abnormal results are displayed) Labs Reviewed  POCT URINALYSIS DIP (MANUAL ENTRY) - Abnormal; Notable for the following components:      Result Value   Clarity, UA hazy (*)    All other components within normal limits  SARS-COV-2 RNA,(COVID-19) QUALITATIVE NAAT  POCT CBC W AUTO DIFF ([facility])  POCT URINE PREGNANCY    EKG   Radiology CT ABDOMEN PELVIS W CONTRAST  Result Date: 02/14/2020 CLINICAL DATA:  LEFT lower quadrant abdominal pain and back pain for 3 days, positive home COVID test 2 days ago  followed by negative tested CTS less bowels positive for COVID EXAM: CT ABDOMEN AND PELVIS WITH CONTRAST TECHNIQUE: Multidetector CT imaging of the abdomen and pelvis was performed using the standard protocol following bolus administration of intravenous contrast. Sagittal and coronal MPR images reconstructed from axial data set. CONTRAST:  OMNIPAQUE IOHEXOL 300 MG/ML SOLN IV. Dilute oral contrast. COMPARISON:  None FINDINGS: Lower chest: Lung bases clear Hepatobiliary: 6 x 5 mm nonspecific low-attenuation focus superiorly LEFT lobe liver image 14. Additional tiny nonspecific hepatic lesion 6 x 5 mm posterior aspect of liver superiorly image 18. In a low risk patient (no history of malignancy, hepatic dysfunction or hepatic risk factors), these are likely benign. No additional hepatic masses. Gallbladder unremarkable. Pancreas: Normal appearance Spleen: Normal appearance Adrenals/Urinary Tract: Adrenal glands, kidneys, ureters, and bladder normal appearance Stomach/Bowel: Normal appendix. Stomach and bowel loops unremarkable. Vascular/Lymphatic: Aorta normal caliber. Vascular structures patent. Dilated LEFT renal vein with numerous prominent vessels in the adnexal regions bilaterally LEFT greater than RIGHT, can be seen with pelvic congestion syndrome. No adenopathy. Reproductive: Unremarkable uterus and ovaries Other: Small amount of free fluid in pelvis. No free air. No hernia. Musculoskeletal: Unremarkable IMPRESSION: Small amount of nonspecific free fluid in pelvis. Dilated LEFT renal vein with numerous prominent vessels in the adnexal regions bilaterally  LEFT greater than RIGHT, can be seen with pelvic congestion syndrome. No other intra-abdominal or intrapelvic abnormalities. Electronically Signed   By: Ulyses Southward M.D.   On: 02/14/2020 13:45    Procedures Procedures (including critical care time)  Medications Ordered in UC Medications  ondansetron (ZOFRAN-ODT) disintegrating tablet 4 mg (4 mg  Oral Given 02/14/20 1110)  ketorolac (TORADOL) injection 60 mg (60 mg Intramuscular Given 02/14/20 1153)    Initial Impression / Assessment and Plan / UC Course  I have reviewed the triage vital signs and the nursing notes.  Pertinent labs & imaging results that were available during my care of the patient were reviewed by me and considered in my medical decision making (see chart for details).     Reviewed imaging and labs with pt. Abdominal pain due to pelvic congestion syndrome without evidence of acute abdominal infection or other abdominal abnormalities.  Fever, HA, body aches, chills- c/w Covid, especially given close contact with husband who lives in same house tested positive for covid the other week. Pt and 23mo old daughter had positive OTC tests.   Covid-19 PCR test pending per pt request, will be needed to help get clearance for travel later next month.    Encouraged f/u with PCP and GYN AVS provided  Final Clinical Impressions(s) / UC Diagnoses   Final diagnoses:  Generalized body aches  LLQ abdominal pain  Acute left-sided low back pain without sciatica  Fever in adult  Female pelvic congestion syndrome  Leukopenia, unspecified type     Discharge Instructions      You may take 500mg  acetaminophen every 4-6 hours or in combination with ibuprofen 400-600mg  every 6-8 hours as needed for pain, inflammation, and fever.  Be sure to well hydrated with clear liquids and get at least 8 hours of sleep at night, preferably more while sick.   Please follow up with family medicine in 1 week if needed.  Because of your close contact with your husband who had covid, it is recommended you also self isolate as if you currently have covid. It is recommended you isolate at home for 10 days since onset of symptoms and 24 hours without fever without use of medication.  Call to schedule a follow up with your family doctor, likely a virtual visit, next week for recheck of  symptoms. It is also recommended you have your bloodwork rechecked in 1-2 weeks to make sure your white cell count is improving.    Call 911 or have someone drive you to the hospital if symptoms worsening.    ED Prescriptions    None     PDMP not reviewed this encounter.   , Lurene Shadow 02/14/20 1411

## 2020-02-14 NOTE — ED Triage Notes (Signed)
Patient presents to Urgent Care with complaints of needing a PCR covid test since she had a positive covid test at home. Patient reports [facility] and [facility] rapid test, it was negative. Has new back pain and LLQ abdominal pain, denies urinary sx, endorses headache despite round-the-clock tylenol, fever of 101, slight cough, chills, body aches, nausea. Pt's husband has had a vasectomy and she is not worried about pregnancy.

## 2020-02-14 NOTE — Discharge Instructions (Signed)
  You may take 500mg  acetaminophen every 4-6 hours or in combination with ibuprofen 400-600mg  every 6-8 hours as needed for pain, inflammation, and fever.  Be sure to well hydrated with clear liquids and get at least 8 hours of sleep at night, preferably more while sick.   Please follow up with family medicine in 1 week if needed.  Because of your close contact with your husband who had covid, it is recommended you also self isolate as if you currently have covid. It is recommended you isolate at home for 10 days since onset of symptoms and 24 hours without fever without use of medication.  Call to schedule a follow up with your family doctor, likely a virtual visit, next week for recheck of symptoms. It is also recommended you have your bloodwork rechecked in 1-2 weeks to make sure your white cell count is improving.    Call 911 or have someone drive you to the hospital if symptoms worsening.

## 2020-02-15 LAB — SARS-COV-2 RNA,(COVID-19) QUALITATIVE NAAT: SARS CoV2 RNA: DETECTED — AB

## 2020-02-16 ENCOUNTER — Telehealth: Payer: Self-pay | Admitting: Emergency Medicine

## 2020-02-16 NOTE — Telephone Encounter (Signed)
Call to [facility] to confirm positive COVID test - confirmed pt's name and DOB. Patient was already aware of results and has made an appointment with her PCP for her flight in august.

## 2020-02-18 ENCOUNTER — Telehealth: Payer: Self-pay | Admitting: *Deleted

## 2020-02-18 NOTE — Telephone Encounter (Signed)
Returned call from 4:06 PM. Left patient a message to call and schedule appointment if needed.

## 2020-02-21 ENCOUNTER — Ambulatory Visit: Payer: 59 | Admitting: Rheumatology

## 2020-02-23 DIAGNOSIS — Z419 Encounter for procedure for purposes other than remedying health state, unspecified: Secondary | ICD-10-CM | POA: Diagnosis not present

## 2020-02-24 DIAGNOSIS — U071 COVID-19: Secondary | ICD-10-CM | POA: Diagnosis not present

## 2020-03-09 ENCOUNTER — Other Ambulatory Visit: Payer: Self-pay | Admitting: Rheumatology

## 2020-03-09 MED ORDER — PREDNISONE 5 MG PO TABS: ORAL_TABLET | ORAL | 0 refills | Status: DC

## 2020-03-09 NOTE — Telephone Encounter (Signed)
Ok to send in a prednisone taper starting at 20 mg tapering by 5 mg every 4 days.

## 2020-03-09 NOTE — Telephone Encounter (Signed)
Patient advised prescription for prednisone has been sent to the pharmacy.  ?

## 2020-03-09 NOTE — Telephone Encounter (Signed)
Patient called stating she has been off her Enbrel and Arava due to being diagnosed with COVID in July.  Patient states she knows it is too soon to begin taking her prescriptions, but is having a bad flair and is asking if she can have a prescription of Prednisone.  Patient requested a return call.

## 2020-03-16 ENCOUNTER — Ambulatory Visit: Payer: 59 | Admitting: Family Medicine

## 2020-03-19 NOTE — Progress Notes (Signed)
Office Visit Note  Patient: Robin [name]             Date of Birth: [birth year]           MRN: [id number]             PCP: Patient, No Pcp Per Referring: No ref. provider found Visit Date: 03/31/2020 Occupation: @GUAROCC @  Subjective:  Other (neck pain )   History of Present Illness: Robin [name] is a 34 y.o. female with history of seropositive rheumatoid arthritis.  She states she developed Covid infection in mid July.  She did not receive monoclonal antibodies and took it easy and gradually the symptoms resolved.  She was off Enbrel for the duration.  She resumed Enbrel and Arava 2 weeks ago.  She states for the last 1 year she has been having pain and stiffness in her cervical spine.  She also hear popping sounds.  The pain has been worse in the last few weeks.  She states sometimes the pain radiates to the right shoulder.  She has some discomfort in her right sternoclavicular joint.  None of the other joints are painful.  She had a bad flare after stopping Enbrel.  She was given a prednisone taper last month.  Activities of Daily Living:  Patient reports morning stiffness for 2  hours.   Patient Reports nocturnal pain.  Difficulty dressing/grooming: Denies Difficulty climbing stairs: Denies Difficulty getting out of chair: Denies Difficulty using hands for taps, buttons, cutlery, and/or writing: Reports  Review of Systems  Constitutional: Positive for fatigue.  HENT: Negative for mouth sores, mouth dryness and nose dryness.   Eyes: Negative for itching and dryness.  Respiratory: Negative for shortness of breath and difficulty breathing.   Cardiovascular: Negative for chest pain and palpitations.  Gastrointestinal: Negative for blood in stool, constipation and diarrhea.  Endocrine: Negative for increased urination.  Genitourinary: Negative for difficulty urinating.  Musculoskeletal: Positive for arthralgias, joint pain, myalgias, morning stiffness, muscle tenderness and  myalgias. Negative for joint swelling.  Skin: Negative for color change, rash and redness.  Allergic/Immunologic: Negative for susceptible to infections.  Neurological: Positive for headaches. Negative for dizziness, numbness, memory loss and weakness.  Hematological: Positive for bruising/bleeding tendency.  Psychiatric/Behavioral: Negative for confusion.    PMFS History:  Patient Active Problem List   Diagnosis Date Noted  . Rheumatoid arthritis with rheumatoid factor of multiple sites without organ or systems involvement (HCC) 12/03/2019  . Normal labor 07/09/2018  . Pregnancy with history of neonatal death 24-May-2018  . Depression affecting pregnancy 04/02/2018  . Irregular periods 01/26/2018  . Hereditary disease in family possibly affecting fetus, affecting management of mother, antepartum condition or complication, not applicable or unspecified fetus   . History of preterm delivery 11/17/2017  . Supervision of high risk pregnancy, antepartum 11/17/2017  . Previous cesarean section 11/17/2017  . Family history of congenital anomaly 11/17/2017  . Family history of congenital heart disease 07/27/2015  . Family hx of bicuspid aortic valve--FOB 04/03/2012    Past Medical History:  Diagnosis Date  . Abnormal Pap smear 2008   HAD HPV;HAD GENITAL WART REMOVED;LAST PAP 07/2010 WAS NORMAL  . Anemia    FeSO4 SUPP TAKEN IN PAST  . Asthma    AS A CHILD;GREW OUT OF @ 8 YOA  . Depression    after the death of her son, was on meds then  . History of smoking 12/26/2011   Reports quit '12  . Infection  UTI;CAN GET FREQ  . Rheumatoid arthritis (HCC)   . Vaginal Pap smear, abnormal     Family History  Problem Relation Age of Onset  . Mitral valve prolapse Mother        DIED @ 75 YOA OF CONDITION  . Healthy Father   . Mitral valve prolapse Paternal Aunt   . Heart attack Paternal Grandfather        DECEASED  . Dementia Maternal Grandmother   . Healthy Son   . Autism Daughter   .  Healthy Daughter   . Healthy Daughter   . Healthy Daughter    Past Surgical History:  Procedure Laterality Date  . CESAREAN SECTION N/A 09/09/2016   Procedure: CESAREAN SECTION;  Surgeon: Tereso Newcomer, MD;  Location: [facility];  Service: Obstetrics;  Laterality: N/A;  . NASAL SINUS SURGERY  2016   Social History   Social History Narrative  . Not on file   There is no immunization history for the selected administration types on file for this patient.   Objective: Vital Signs: BP 110/78 (BP Location: Left Arm, Patient Position: Sitting, Cuff Size: Normal)   Pulse 90   Resp 13   Ht 5\' 6"  (1.676 m)   Wt 104 lb 12.8 oz (47.5 kg)   LMP 03/13/2020   BMI 16.92 kg/m    Physical Exam Vitals and nursing note reviewed.  Constitutional:      Appearance: She is well-developed.  HENT:     Head: Normocephalic and atraumatic.  Eyes:     Conjunctiva/sclera: Conjunctivae normal.  Cardiovascular:     Rate and Rhythm: Normal rate and regular rhythm.     Heart sounds: Normal heart sounds.  Pulmonary:     Effort: Pulmonary effort is normal.     Breath sounds: Normal breath sounds.  Abdominal:     General: Bowel sounds are normal.     Palpations: Abdomen is soft.  Musculoskeletal:     Cervical back: Normal range of motion.  Lymphadenopathy:     Cervical: No cervical adenopathy.  Skin:    General: Skin is warm and dry.     Capillary Refill: Capillary refill takes less than 2 seconds.  Neurological:     Mental Status: She is alert and oriented to person, place, and time.  Psychiatric:        Behavior: Behavior normal.      Musculoskeletal Exam: She had discomfort range of motion of her cervical spine.  She had synovitis of her right sternoclavicular joint.  Shoulder joints and elbow joints were in good range of motion.  She had tenderness on palpation of her left elbow.  There was no synovitis over MCPs or PIPs of her hands.  Hip joints, knee joints, ankles with good  range of motion.  She has tenderness over right third and fourth MTPs.  CDAI Exam: CDAI Score: -- Patient Global: --; Provider Global: 5 mm Swollen: 1 ; Tender: 5  Joint Exam 03/31/2020      Right  Left  Sternoclavicular  Swollen Tender     Elbow      Tender  Cervical Spine   Tender     MTP 3   Tender     MTP 4   Tender        Investigation: No additional findings.  Imaging: No results found.  Recent Labs: Lab Results  Component Value Date   WBC 7.0 12/03/2019   HGB 13.0 12/03/2019   PLT 361 12/03/2019  NA 139 12/03/2019   K 4.6 12/03/2019   CL 105 12/03/2019   CO2 25 12/03/2019   GLUCOSE 82 12/03/2019   BUN 9 12/03/2019   CREATININE 0.61 12/03/2019   BILITOT 0.5 12/03/2019   AST 12 12/03/2019   ALT 11 12/03/2019   PROT 7.2 12/03/2019   CALCIUM 8.8 12/03/2019   GFRAA 139 12/03/2019   QFTBGOLDPLUS NEGATIVE 10/29/2019    Speciality Comments: No specialty comments available.  Procedures:  No procedures performed Allergies: Patient has no known allergies.   Assessment / Plan:     Visit Diagnoses: Rheumatoid arthritis with rheumatoid factor of multiple sites without organ or systems involvement (HCC)-patient had recent flare as she was off Enbrel and Arava after getting COVID-19 infection.  She had COVID-19 infection in July from which she gradually recovered.  She states she still does not have taste and smell.  She had a severe flare for which she required prednisone taper.  She still have some residual synovitis and inflammation.  Neck pain-she has been experiencing pain in her neck for the last 1 year.  She states the pain is worse currently.  She states she has some popping sensation in her cervical spine and some radiculopathy to the right side.  We will obtain x-ray of her cervical spine.  Pain in left elbow-I believe the discomfort is coming from recent rheumatoid arthritis flare.  She had tenderness on palpation.  High risk medication use - Enbrel 50 mg  sq injections every week-started on 11/07/2019.  Added Arava at last OV - Plan: CBC with Differential/Platelet, COMPLETE METABOLIC PANEL WITH GFR  History of preterm delivery  Pregnancy with history of neonatal death  Family hx of bicuspid aortic valve--FOB  Family history of congenital heart disease  Advice given against COVID-19 infection-patient had COVID-19 infection in July.  She is not immunized.  We had detailed discussion regarding getting COVID-19 vaccination.  Use of mask, social distancing and hand hygiene was emphasized.  Increased risk of complications in people with COVID-19 was also discussed.  I also discussed use of monoclonal antibodies in patients with immunosuppression.  All the information was placed in the AVS.  Orders: Orders Placed This Encounter  Procedures  . DG Cervical Spine 2 or 3 views  . CBC with Differential/Platelet  . COMPLETE METABOLIC PANEL WITH GFR   No orders of the defined types were placed in this encounter.    Follow-Up Instructions: Return in about 3 months (around 06/30/2020) for Rheumatoid arthritis.   Pollyann Savoy, MD  Note - This record has been created using Animal nutritionist.  Chart creation errors have been sought, but may not always  have been located. Such creation errors do not reflect on  the standard of medical care.

## 2020-03-23 ENCOUNTER — Telehealth: Payer: Self-pay | Admitting: Rheumatology

## 2020-03-23 NOTE — Telephone Encounter (Signed)
Please clarify if all of her covid symptoms have resolved? If she is not having any signs or symptoms of an infection it is ok for her to restart Arava and Enbrel so she does not continue to have recurrent flares.

## 2020-03-23 NOTE — Telephone Encounter (Signed)
Patient states she is not having any signs or symptoms of infection. Patient advised if she is not having any signs or symptoms of an infection it is ok for her to restart Arava and Enbrel so she does not continue to have recurrent flares.

## 2020-03-23 NOTE — Telephone Encounter (Signed)
Patient called stating she is almost finished with her Prednisone taper and has been experiencing a lot of pain in her neck.   Patient states she hears a crunching noise every time she turns her head left and right.  Patient states she would like to restart her Enbrel and Arava and is requesting a return call.

## 2020-03-25 DIAGNOSIS — Z419 Encounter for procedure for purposes other than remedying health state, unspecified: Secondary | ICD-10-CM | POA: Diagnosis not present

## 2020-03-26 ENCOUNTER — Ambulatory Visit (INDEPENDENT_AMBULATORY_CARE_PROVIDER_SITE_OTHER): Payer: 59 | Admitting: Obstetrics & Gynecology

## 2020-03-26 ENCOUNTER — Encounter: Payer: Self-pay | Admitting: Obstetrics & Gynecology

## 2020-03-26 ENCOUNTER — Other Ambulatory Visit: Payer: Self-pay

## 2020-03-26 VITALS — BP 112/66 | HR 99 | Resp 16 | Ht 67.0 in | Wt 105.0 lb

## 2020-03-26 DIAGNOSIS — N926 Irregular menstruation, unspecified: Secondary | ICD-10-CM

## 2020-03-26 DIAGNOSIS — R102 Pelvic and perineal pain: Secondary | ICD-10-CM

## 2020-03-26 LAB — TSH: TSH: 0.96 mIU/L

## 2020-03-26 NOTE — Patient Instructions (Signed)
Abnormal Uterine Bleeding °Abnormal uterine bleeding is unusual bleeding from the uterus. It includes: °· Bleeding or spotting between periods. °· Bleeding after sex. °· Bleeding that is heavier than normal. °· Periods that last longer than usual. °· Bleeding after menopause. °Abnormal uterine bleeding can affect women at various stages in life, including teenagers, women in their reproductive years, pregnant women, and women who have reached menopause. Common causes of abnormal uterine bleeding include: °· Pregnancy. °· Growths of tissue (polyps). °· A noncancerous tumor in the uterus (fibroid). °· Infection. °· Cancer. °· Hormonal imbalances. °Any type of abnormal bleeding should be evaluated by a health care provider. Many cases are minor and simple to treat, while others are more serious. Treatment will depend on the cause of the bleeding. °Follow these instructions at home: °· Monitor your condition for any changes. °· Do not use tampons, douche, or have sex if told by your health care provider. °· Change your pads often. °· Get regular exams that include pelvic exams and cervical cancer screening. °· Keep all follow-up visits as told by your health care provider. This is important. °Contact a health care provider if: °· Your bleeding lasts for more than one week. °· You feel dizzy at times. °· You feel nauseous or you vomit. °Get help right away if: °· You pass out. °· Your bleeding soaks through a pad every hour. °· You have abdominal pain. °· You have a fever. °· You become sweaty or weak. °· You pass large blood clots from your vagina. °Summary °· Abnormal uterine bleeding is unusual bleeding from the uterus. °· Any type of abnormal bleeding should be evaluated by a health care provider. Many cases are minor and simple to treat, while others are more serious. °· Treatment will depend on the cause of the bleeding. °This information is not intended to replace advice given to you by your health care provider.  Make sure you discuss any questions you have with your health care provider. °Document Revised: 10/18/2017 Document Reviewed: 08/12/2016 °Elsevier Patient Education © 2020 [employer] Inc. ° °

## 2020-03-26 NOTE — Progress Notes (Signed)
History:  34 y.o. [id number] here today for eval of 'pelvic congestion syndrome' dx'd via CT> Pt was being eval for COVID related complaints and a CT was done. The prev dx'd were noted. She reports that since that time she has had mild pain assoc with her cycles. She reports that she has some spotting and on the last 2 months small clots during ovulation. Her current cycles is late by 1 week.  She is married and monogamous and denies need for STI screening. After reading on Pelivc Congestion Syndrome she wanted to clarify that she does not want to take OCPs and is not interested in surgery.  She does not consider her sx severe. She was last screened for thyroid [place] [id number]. Her GM has thyroid disease. She is not clear on her maternal history as her mother died when the pt was 59 years old. Pts husband has a vasectomy.    The following portions of the patient's history were reviewed and updated as appropriate: allergies, current medications, past family history, past medical history, past social history, past surgical history and problem list.  Review of Systems:  Pertinent items are noted in HPI.    Objective:  Physical Exam Blood pressure 112/66, pulse 99, resp. rate 16, height 5\' 7"  (1.702 m), weight 105 lb (47.6 kg), last menstrual period 03/13/2020, not currently breastfeeding.  CONSTITUTIONAL: Well-developed, well-nourished female in no acute distress.  HENT:  Normocephalic, atraumatic EYES: Conjunctivae and EOM are normal. No scleral icterus.  NECK: Normal range of motion SKIN: Skin is warm and dry. No rash noted. Not diaphoretic.No pallor. NEUROLGIC: Alert and oriented to person, place, and time. Normal coordination.  Abd: Soft, nontender and nondistended Pelvic: Normal appearing external genitalia; normal appearing vaginal mucosa and cervix; no polyps noted. Normal discharge.  Small uterus, no other palpable masses, no uterine or adnexal tenderness  Labs and Imaging 02/14/2020 CLINICAL  DATA:  LEFT lower quadrant abdominal pain and back pain for 3 days, positive home COVID test 2 days ago followed by negative tested CTS less bowels positive for COVID  EXAM: CT ABDOMEN AND PELVIS WITH CONTRAST  TECHNIQUE: Multidetector CT imaging of the abdomen and pelvis was performed using the standard protocol following bolus administration of intravenous contrast. Sagittal and coronal MPR images reconstructed from axial data set.  CONTRAST:  02/16/2020 OMNIPAQUE IOHEXOL 300 MG/ML SOLN IV. Dilute oral contrast.  COMPARISON:  None  FINDINGS: Lower chest: Lung bases clear  Hepatobiliary: 6 x 5 mm nonspecific low-attenuation focus superiorly LEFT lobe liver image 14. Additional tiny nonspecific hepatic lesion 6 x 5 mm posterior aspect of liver superiorly image 18. In a low risk patient (no history of malignancy, hepatic dysfunction or hepatic risk factors), these are likely benign. No additional hepatic masses. Gallbladder unremarkable.  Pancreas: Normal appearance  Spleen: Normal appearance  Adrenals/Urinary Tract: Adrenal glands, kidneys, ureters, and bladder normal appearance  Stomach/Bowel: Normal appendix. Stomach and bowel loops unremarkable.  Vascular/Lymphatic: Aorta normal caliber. Vascular structures patent. Dilated LEFT renal vein with numerous prominent vessels in the adnexal regions bilaterally LEFT greater than RIGHT, can be seen with pelvic congestion syndrome. No adenopathy.  Reproductive: Unremarkable uterus and ovaries  Other: Small amount of free fluid in pelvis. No free air. No hernia.  Musculoskeletal: Unremarkable  IMPRESSION: Small amount of nonspecific free fluid in pelvis.  Dilated LEFT renal vein with numerous prominent vessels in the adnexal regions bilaterally LEFT greater than RIGHT, can be seen with pelvic congestion syndrome.  No other intra-abdominal  or intrapelvic abnormalities.   Assessment & Plan:  Menstrual  irregularities- will check TSH. No clear etiology.   Rec observation for 3-6 months to see if this returns to her baseline.   Pt does not want hormones to regulate her cycles  F/u in 3 months or sooner prn  Pelvic pain- this is mild and is resolved with heating pad.   Rec NSAIDS prn  Pelvic congestion on CT scan  Reviewed CT results.   I have reviewed with pt that this can be a normal variants and that management is not needed based on her sx.   Would just watch for any signs or sx.   Total face-to-face time with patient was 25 min.  Greater than 50% was spent in counseling and coordination of care with the patient.   Kien Mirsky L. Harraway-Smith, M.D., [facility]

## 2020-03-31 ENCOUNTER — Other Ambulatory Visit: Payer: Self-pay

## 2020-03-31 ENCOUNTER — Encounter: Payer: Self-pay | Admitting: Rheumatology

## 2020-03-31 ENCOUNTER — Ambulatory Visit (INDEPENDENT_AMBULATORY_CARE_PROVIDER_SITE_OTHER): Payer: 59 | Admitting: Rheumatology

## 2020-03-31 VITALS — BP 110/78 | HR 90 | Resp 13 | Ht 66.0 in | Wt 104.8 lb

## 2020-03-31 DIAGNOSIS — M0579 Rheumatoid arthritis with rheumatoid factor of multiple sites without organ or systems involvement: Secondary | ICD-10-CM | POA: Diagnosis not present

## 2020-03-31 DIAGNOSIS — M542 Cervicalgia: Secondary | ICD-10-CM

## 2020-03-31 DIAGNOSIS — O09299 Supervision of pregnancy with other poor reproductive or obstetric history, unspecified trimester: Secondary | ICD-10-CM | POA: Diagnosis not present

## 2020-03-31 DIAGNOSIS — Z79899 Other long term (current) drug therapy: Secondary | ICD-10-CM | POA: Diagnosis not present

## 2020-03-31 DIAGNOSIS — Z8774 Personal history of (corrected) congenital malformations of heart and circulatory system: Secondary | ICD-10-CM

## 2020-03-31 DIAGNOSIS — Z8751 Personal history of pre-term labor: Secondary | ICD-10-CM | POA: Diagnosis not present

## 2020-03-31 DIAGNOSIS — Z8279 Family history of other congenital malformations, deformations and chromosomal abnormalities: Secondary | ICD-10-CM

## 2020-03-31 DIAGNOSIS — M25522 Pain in left elbow: Secondary | ICD-10-CM

## 2020-03-31 DIAGNOSIS — Z7189 Other specified counseling: Secondary | ICD-10-CM

## 2020-03-31 LAB — COMPLETE METABOLIC PANEL WITH GFR
AG Ratio: 1.7 (calc) (ref 1.0–2.5)
ALT: 10 U/L (ref 6–29)
AST: 15 U/L (ref 10–30)
Albumin: 4.5 g/dL (ref 3.6–5.1)
Alkaline phosphatase (APISO): 54 U/L (ref 31–125)
BUN: 15 mg/dL (ref 7–25)
CO2: 27 mmol/L (ref 20–32)
Calcium: 9.7 mg/dL (ref 8.6–10.2)
Chloride: 104 mmol/L (ref 98–110)
Creat: 0.81 mg/dL (ref 0.50–1.10)
GFR, Est African American: 111 mL/min/{1.73_m2} (ref >=60)
GFR, Est Non African American: 95 mL/min/{1.73_m2} (ref >=60)
Globulin: 2.7 g/dL (calc) (ref 1.9–3.7)
Glucose, Bld: 72 mg/dL (ref 65–99)
Potassium: 4.5 mmol/L (ref 3.5–5.3)
Sodium: 140 mmol/L (ref 135–146)
Total Bilirubin: 0.5 mg/dL (ref 0.2–1.2)
Total Protein: 7.2 g/dL (ref 6.1–8.1)

## 2020-03-31 LAB — CBC WITH DIFFERENTIAL/PLATELET
Absolute Monocytes: 543 cells/uL (ref 200–950)
Basophils Absolute: 97 cells/uL (ref 0–200)
Basophils Relative: 1.2 %
Eosinophils Absolute: 324 cells/uL (ref 15–500)
Eosinophils Relative: 4 %
HCT: 40.8 % (ref 35.0–45.0)
Hemoglobin: 13.5 g/dL (ref 11.7–15.5)
Lymphs Abs: 1612 cells/uL (ref 850–3900)
MCH: 30.3 pg (ref 27.0–33.0)
MCHC: 33.1 g/dL (ref 32.0–36.0)
MCV: 91.5 fL (ref 80.0–100.0)
MPV: 10.2 fL (ref 7.5–12.5)
Monocytes Relative: 6.7 %
Neutro Abs: 5524 cells/uL (ref 1500–7800)
Neutrophils Relative %: 68.2 %
Platelets: 292 10*3/uL (ref 140–400)
RBC: 4.46 10*6/uL (ref 3.80–5.10)
RDW: 13.1 % (ref 11.0–15.0)
Total Lymphocyte: 19.9 %
WBC: 8.1 10*3/uL (ref 3.8–10.8)

## 2020-03-31 NOTE — Patient Instructions (Addendum)
COVID-19 vaccine recommendations:  ° °COVID-19 vaccine is recommended for everyone (unless you are allergic to a vaccine component), even if you are on a medication that suppresses your immune system.  ° °If you are on Methotrexate, Cellcept (mycophenolate), Rinvoq, Xeljanz, and Olumiant- hold the medication for 1 week after each vaccine. Hold Methotrexate for 2 weeks after the single dose COVID-19 vaccine.  ° °If you are on Orencia subcutaneous injection - hold medication one week prior to and one week after the first COVID-19 vaccine dose (only).  ° °If you are on Orencia IV infusions- time vaccination administration so that the first COVID-19 vaccination will occur four weeks after the infusion and postpone the subsequent infusion by one week.  ° °If you are on Cyclophosphamide or Rituxan infusions please contact your doctor prior to receiving the COVID-19 vaccine.  ° °Do not take Tylenol or any anti-inflammatory medications (NSAIDs) 24 hours prior to the COVID-19 vaccination.  ° °There is no direct evidence about the efficacy of the COVID-19 vaccine in individuals who are on medications that suppress the immune system.  ° °Even if you are fully vaccinated, and you are on any medications that suppress your immune system, please continue to wear a mask, maintain at least six feet social distance and practice hand hygiene.  ° °If you develop a COVID-19 infection, please contact your PCP or our office to determine if you need antibody infusion. ° °The booster vaccine is now available for immunocompromised patients. It is advised that if you had Pfizer vaccine you should get Pfizer booster.  If you had a Moderna vaccine then you should get a Moderna booster. Johnson and Johnson does not have a booster vaccine at this time. ° °Please see the following web sites for updated information.   ° °https://www.[facility]org/Portals/0/Files/COVID-19-Vaccination-Patient-Resources.pdf ° °https://[email or web address]org/About-Us/Newsroom/Press-Releases/ID/1159 ° °Standing Labs °We placed an order today for your standing lab work.  ° °Please have your standing labs drawn in December and every 3 months  ° °If possible, please have your labs drawn 2 weeks prior to your appointment so that the provider can discuss your results at your appointment. ° °We have open lab daily °Monday through Thursday from 8:30-12:30 PM and 1:30-4:30 PM and Friday from 8:30-12:30 PM and 1:30-4:00 PM °at the office of Dr. Dynasty Holquin, [facility].   °Please be advised, patients with office appointments requiring lab work will take precedents over walk-in lab work.  °If possible, please come for your lab work on Monday and Friday afternoons, as you may experience shorter wait times. °The office is located at [address], [address], [place], [place] [address] °No appointment is necessary.   °Labs are drawn by [facility]. Please bring your co-pay at the time of your lab draw.  You may receive a bill from [facility] for your lab work. ° °If you wish to have your labs drawn at another location, please call the office 24 hours in advance to send orders. ° °If you have any questions regarding directions or hours of operation,  °please call [phone].   °As a reminder, please drink plenty of water prior to coming for your lab work. Thanks! ° ° °

## 2020-04-01 NOTE — Progress Notes (Signed)
CBC and CMP are normal.

## 2020-04-03 ENCOUNTER — Other Ambulatory Visit: Payer: Self-pay | Admitting: Rheumatology

## 2020-04-03 DIAGNOSIS — M0579 Rheumatoid arthritis with rheumatoid factor of multiple sites without organ or systems involvement: Secondary | ICD-10-CM

## 2020-04-03 NOTE — Telephone Encounter (Addendum)
Too soon to refill.

## 2020-04-07 ENCOUNTER — Other Ambulatory Visit: Payer: Self-pay | Admitting: Rheumatology

## 2020-04-07 DIAGNOSIS — M0579 Rheumatoid arthritis with rheumatoid factor of multiple sites without organ or systems involvement: Secondary | ICD-10-CM

## 2020-04-24 DIAGNOSIS — Z419 Encounter for procedure for purposes other than remedying health state, unspecified: Secondary | ICD-10-CM | POA: Diagnosis not present

## 2020-05-05 ENCOUNTER — Other Ambulatory Visit: Payer: Self-pay | Admitting: Rheumatology

## 2020-05-05 DIAGNOSIS — M0579 Rheumatoid arthritis with rheumatoid factor of multiple sites without organ or systems involvement: Secondary | ICD-10-CM

## 2020-05-05 MED ORDER — ENBREL MINI 50 MG/ML ~~LOC~~ SOCT: 50.0000 mg | SUBCUTANEOUS | 0 refills | Status: DC

## 2020-05-05 NOTE — Telephone Encounter (Signed)
Patient needs refill on Enbrel Mini 50 mg sent to [facility]. Patient states pharmacy gave her phone # [phone] for a verbal order.

## 2020-05-05 NOTE — Telephone Encounter (Signed)
Last Visit: 03/31/2020 Next Visit: 07/01/2020 Labs: 03/31/2020 CBC and CMP are normal. TB Gold: 10/29/2019 negative   Current Dose per office note on 03/31/2020: Enbrel 50 mg sq injections every week  QM:[id number] arthritis with rheumatoid factor of multiple sites without organ or systems involvement   Okay to refill enbrel mini?

## 2020-05-20 ENCOUNTER — Telehealth: Payer: Self-pay | Admitting: *Deleted

## 2020-05-20 NOTE — Telephone Encounter (Signed)
Patient is not wanting to schedule 3 month F/U visit at this time, not having any issues. Will call to schedule if she does.

## 2020-05-25 DIAGNOSIS — Z419 Encounter for procedure for purposes other than remedying health state, unspecified: Secondary | ICD-10-CM | POA: Diagnosis not present

## 2020-05-27 ENCOUNTER — Other Ambulatory Visit: Payer: Self-pay

## 2020-05-27 MED ORDER — PREDNISONE 5 MG PO TABS
ORAL_TABLET | ORAL | 0 refills | Status: DC
Start: 2020-05-27 — End: 2020-08-17

## 2020-05-27 NOTE — Telephone Encounter (Signed)
Please clarify if the patient has missed any doses of enbrel or arava recently.  If she continues to have recurrent flares she should schedule a sooner office visit to discuss other treatment options.  Ok to send in prednisone taper starting at 20 mg tapering by 5 mg every 4 days.

## 2020-05-27 NOTE — Telephone Encounter (Signed)
Attempted to contact the patient and left message for patient to call the office.  

## 2020-05-27 NOTE — Telephone Encounter (Signed)
Patient returned call and states she has not missed any doses of Enbrel. Patient states she is not taking the Arava correctly. Patient states she has been taking it once every 2 weeks. Patient encouraged to start taking Arava daily as prescribed. Patient expressed understanding.

## 2020-05-27 NOTE — Telephone Encounter (Signed)
Patient called stating she woke up in the middle of the night with left hand pain and swelling.  Patient states it was so painful that she needed her husband to come home from work (he work's 3rd shift) to open a bottle of Ibuprofen.  Patient states she also needed his help with dressing herself this morning.  Patient is requesting prescription of Prednisone taper to be sent to [facility].

## 2020-06-17 NOTE — Progress Notes (Deleted)
Office Visit Note  Patient: Robin [name]             Date of Birth: April 21, 1986           MRN: [id number]             PCP: Patient, No Pcp Per Referring: No ref. provider found Visit Date: 07/01/2020 Occupation: @GUAROCC @  Subjective:  No chief complaint on file.   History of Present Illness: Robin [name] is a 34 y.o. female ***   Activities of Daily Living:  Patient reports morning stiffness for *** {minute/hour:19697}.   Patient {ACTIONS;DENIES/REPORTS:21021675::"Denies"} nocturnal pain.  Difficulty dressing/grooming: {ACTIONS;DENIES/REPORTS:21021675::"Denies"} Difficulty climbing stairs: {ACTIONS;DENIES/REPORTS:21021675::"Denies"} Difficulty getting out of chair: {ACTIONS;DENIES/REPORTS:21021675::"Denies"} Difficulty using hands for taps, buttons, cutlery, and/or writing: {ACTIONS;DENIES/REPORTS:21021675::"Denies"}  No Rheumatology ROS completed.   PMFS History:  Patient Active Problem List   Diagnosis Date Noted  . Rheumatoid arthritis with rheumatoid factor of multiple sites without organ or systems involvement (HCC) 12/03/2019  . Normal labor 07/09/2018  . Pregnancy with history of neonatal death 05/12/2018  . Depression affecting pregnancy 04/02/2018  . Irregular periods 01/26/2018  . Hereditary disease in family possibly affecting fetus, affecting management of mother, antepartum condition or complication, not applicable or unspecified fetus   . History of preterm delivery 11/17/2017  . Supervision of high risk pregnancy, antepartum 11/17/2017  . Previous cesarean section 11/17/2017  . Family history of congenital anomaly 11/17/2017  . Family history of congenital heart disease 07/27/2015  . Family hx of bicuspid aortic valve--FOB 04/03/2012    Past Medical History:  Diagnosis Date  . Abnormal Pap smear 2008   HAD HPV;HAD GENITAL WART REMOVED;LAST PAP 07/2010 WAS NORMAL  . Anemia    FeSO4 SUPP TAKEN IN PAST  . Asthma    AS A CHILD;GREW OUT OF @ 8 YOA  .  Depression    after the death of her son, was on meds then  . History of smoking 12/26/2011   Reports quit '12  . Infection    UTI;CAN GET FREQ  . Rheumatoid arthritis (HCC)   . Vaginal Pap smear, abnormal     Family History  Problem Relation Age of Onset  . Mitral valve prolapse Mother        DIED @ 7 YOA OF CONDITION  . Healthy Father   . Mitral valve prolapse Paternal Aunt   . Heart attack Paternal Grandfather        DECEASED  . Dementia Maternal Grandmother   . Healthy Son   . Autism Daughter   . Healthy Daughter   . Healthy Daughter   . Healthy Daughter    Past Surgical History:  Procedure Laterality Date  . CESAREAN SECTION N/A 09/09/2016   Procedure: CESAREAN SECTION;  Surgeon: 09/11/2016, MD;  Location: [facility];  Service: Obstetrics;  Laterality: N/A;  . NASAL SINUS SURGERY  2016   Social History   Social History Narrative  . Not on file   There is no immunization history for the selected administration types on file for this patient.   Objective: Vital Signs: There were no vitals taken for this visit.   Physical Exam   Musculoskeletal Exam: ***  CDAI Exam: CDAI Score: -- Patient Global: --; Provider Global: -- Swollen: --; Tender: -- Joint Exam 07/01/2020   No joint exam has been documented for this visit   There is currently no information documented on the homunculus. Go to the Rheumatology activity and complete the homunculus joint  exam.  Investigation: No additional findings.  Imaging: No results found.  Recent Labs: Lab Results  Component Value Date   WBC 8.1 03/31/2020   HGB 13.5 03/31/2020   PLT 292 03/31/2020   NA 140 03/31/2020   K 4.5 03/31/2020   CL 104 03/31/2020   CO2 27 03/31/2020   GLUCOSE 72 03/31/2020   BUN 15 03/31/2020   CREATININE 0.81 03/31/2020   BILITOT 0.5 03/31/2020   AST 15 03/31/2020   ALT 10 03/31/2020   PROT 7.2 03/31/2020   CALCIUM 9.7 03/31/2020   GFRAA 111 03/31/2020   QFTBGOLDPLUS  NEGATIVE 10/29/2019    Speciality Comments: No specialty comments available.  Procedures:  No procedures performed Allergies: Patient has no known allergies.   Assessment / Plan:     Visit Diagnoses: No diagnosis found.  Orders: No orders of the defined types were placed in this encounter.  No orders of the defined types were placed in this encounter.   Face-to-face time spent with patient was *** minutes. Greater than 50% of time was spent in counseling and coordination of care.  Follow-Up Instructions: No follow-ups on file.   Ellen Henri, CMA  Note - This record has been created using Animal nutritionist.  Chart creation errors have been sought, but may not always  have been located. Such creation errors do not reflect on  the standard of medical care.

## 2020-06-24 DIAGNOSIS — Z419 Encounter for procedure for purposes other than remedying health state, unspecified: Secondary | ICD-10-CM | POA: Diagnosis not present

## 2020-07-01 ENCOUNTER — Ambulatory Visit: Payer: 59 | Admitting: Rheumatology

## 2020-07-01 DIAGNOSIS — Z8751 Personal history of pre-term labor: Secondary | ICD-10-CM

## 2020-07-01 DIAGNOSIS — Z8774 Personal history of (corrected) congenital malformations of heart and circulatory system: Secondary | ICD-10-CM

## 2020-07-01 DIAGNOSIS — M25522 Pain in left elbow: Secondary | ICD-10-CM

## 2020-07-01 DIAGNOSIS — Z8279 Family history of other congenital malformations, deformations and chromosomal abnormalities: Secondary | ICD-10-CM

## 2020-07-01 DIAGNOSIS — Z79899 Other long term (current) drug therapy: Secondary | ICD-10-CM

## 2020-07-01 DIAGNOSIS — M542 Cervicalgia: Secondary | ICD-10-CM

## 2020-07-01 DIAGNOSIS — O09299 Supervision of pregnancy with other poor reproductive or obstetric history, unspecified trimester: Secondary | ICD-10-CM

## 2020-07-01 DIAGNOSIS — M0579 Rheumatoid arthritis with rheumatoid factor of multiple sites without organ or systems involvement: Secondary | ICD-10-CM

## 2020-07-05 ENCOUNTER — Other Ambulatory Visit: Payer: Self-pay | Admitting: Physician Assistant

## 2020-07-06 NOTE — Telephone Encounter (Addendum)
Last Visit: 03/31/2020 Next Visit: 08/17/2020 Labs: 03/31/2020 CBC and CMP are normal.  Current Dose per office note on 03/31/2020: not mentioned Dx: Rheumatoid arthritis with rheumatoid factor of multiple sites without organ or systems involvement   Patient advised she is due to update labs. Patient states she will come into the office next week to update.   Okay to refill 30 day supply Arava?

## 2020-07-09 ENCOUNTER — Emergency Department (INDEPENDENT_AMBULATORY_CARE_PROVIDER_SITE_OTHER)
Admission: EM | Admit: 2020-07-09 | Discharge: 2020-07-09 | Disposition: A | Discharge status: Home / Self Care | Payer: 59 | Source: Home / Self Care

## 2020-07-09 ENCOUNTER — Emergency Department (INDEPENDENT_AMBULATORY_CARE_PROVIDER_SITE_OTHER): Payer: 59

## 2020-07-09 ENCOUNTER — Encounter: Payer: Self-pay | Admitting: Emergency Medicine

## 2020-07-09 ENCOUNTER — Other Ambulatory Visit: Payer: Self-pay

## 2020-07-09 DIAGNOSIS — Z20822 Contact with and (suspected) exposure to covid-19: Secondary | ICD-10-CM

## 2020-07-09 DIAGNOSIS — U099 Post covid-19 condition, unspecified: Secondary | ICD-10-CM

## 2020-07-09 DIAGNOSIS — R509 Fever, unspecified: Secondary | ICD-10-CM | POA: Diagnosis not present

## 2020-07-09 DIAGNOSIS — R0781 Pleurodynia: Secondary | ICD-10-CM

## 2020-07-09 DIAGNOSIS — J069 Acute upper respiratory infection, unspecified: Secondary | ICD-10-CM | POA: Diagnosis not present

## 2020-07-09 DIAGNOSIS — R059 Cough, unspecified: Secondary | ICD-10-CM | POA: Diagnosis not present

## 2020-07-09 MED ORDER — BENZONATATE 100 MG PO CAPS: 100.0000 mg | ORAL_CAPSULE | Freq: Three times a day (TID) | ORAL | 0 refills | Status: DC

## 2020-07-09 MED ORDER — ALBUTEROL SULFATE HFA 108 (90 BASE) MCG/ACT IN AERS: 1.0000 | INHALATION_SPRAY | Freq: Four times a day (QID) | RESPIRATORY_TRACT | 0 refills | Status: DC | PRN

## 2020-07-09 NOTE — ED Triage Notes (Addendum)
Cough & congestion w/ sore throat since Sunday  Covid exposure at [facility] on 12/6 Cough worse at night  Temp of 101 on Tuesday NO COVID vaccine Had Covid in July - still has no taste or smell

## 2020-07-09 NOTE — ED Provider Notes (Signed)
[facility]    CSN: [id number] Arrival date & time: 07/09/20  [id number]      History   Chief Complaint Chief Complaint  Patient presents with  . Cough  . Covid Exposure    HPI Robin [name] is a 34 y.o. female.   HPI Robin [name] is a 34 y.o. female presenting to UC with c/o sore throat, mildly productive cough with congestion for 5 days, and fever Tmax 101*F the last two days. Temp last night was 100.1*F.  She has not been vaccinated for COVID and flu and [place] [employer]  She did have COVID in July 2021, has had loss of taste and smell since then.  She has more energy right now than she did when dx with COVID in July.    Past Medical History:  Diagnosis Date  . Abnormal Pap smear 2008   HAD HPV;HAD GENITAL WART REMOVED;LAST PAP 07/2010 WAS NORMAL  . Anemia    FeSO4 SUPP TAKEN IN PAST  . Asthma    AS A CHILD;GREW OUT OF @ 8 YOA  . Depression    after the death of her son, was on meds then  . History of smoking 12/26/2011   Reports quit '12  . Infection    UTI;CAN GET FREQ  . Rheumatoid arthritis (HCC)   . Vaginal Pap smear, abnormal     Patient Active Problem List   Diagnosis Date Noted  . Rheumatoid arthritis with rheumatoid factor of multiple sites without organ or systems involvement (HCC) 12/03/2019  . Normal labor 07/09/2018  . Pregnancy with history of neonatal death 04/27/2018  . Depression affecting pregnancy 04/02/2018  . Irregular periods 01/26/2018  . Hereditary disease in family possibly affecting fetus, affecting management of mother, antepartum condition or complication, not applicable or unspecified fetus   . History of preterm delivery 11/17/2017  . Supervision of high risk pregnancy, antepartum 11/17/2017  . Previous cesarean section 11/17/2017  . Family history of congenital anomaly 11/17/2017  . Family history of congenital heart disease 07/27/2015  . Family hx of bicuspid aortic valve--FOB  04/03/2012    Past Surgical History:  Procedure Laterality Date  . CESAREAN SECTION N/A 09/09/2016   Procedure: CESAREAN SECTION;  Surgeon: Tereso NewcomerUgonna A Anyanwu, MD;  Location: [facility];  Service: Obstetrics;  Laterality: N/A;  . NASAL SINUS SURGERY  2016    OB History    Gravida  6   Para  5   Term  4   Preterm  1   AB  1   Living  4     SAB  1   IAB      Ectopic      Multiple  0   Live Births  5            Home Medications    Prior to Admission medications   Medication Sig Start Date End Date Taking? Authorizing Provider  ascorbic acid (VITAMIN C) 1000 MG tablet Take by mouth.   Yes [provider]  cetirizine (ZYRTEC) 10 MG tablet Take 10 mg by mouth daily.   Yes [provider]  diphenhydrAMINE HCl (BENADRYL ALLERGY PO) Take by mouth as needed.   Yes [provider]  fluticasone (FLONASE) 50 MCG/ACT nasal spray Place into both nostrils 2 (two) times daily.   Yes [provider]  leflunomide (ARAVA) 20 MG tablet Take 1 tablet (20 mg total) by mouth daily. 07/06/20  Yes  Gearldine Bienenstock, PA-C  PREVIDENT 5000 BOOSTER PLUS 1.1 % PSTE Place onto teeth as directed. 01/15/20  Yes [provider]  SERTRALINE HCL PO Take 20 mg by mouth daily.   Yes [provider]  Zinc 30 MG CAPS Take 1 tablet by mouth daily.   Yes [provider]  albuterol (VENTOLIN HFA) 108 (90 Base) MCG/ACT inhaler Inhale 1-2 puffs into the lungs every 6 (six) hours as needed for wheezing or shortness of breath. 07/09/20   Lurene Shadow, PA-C  benzonatate (TESSALON) 100 MG capsule Take 1-2 capsules (100-200 mg total) by mouth every 8 (eight) hours. 07/09/20   Lurene Shadow, PA-C  Cholecalciferol 125 MCG (5000 UT) capsule Take by mouth.    [provider]  Etanercept (ENBREL MINI) 50 MG/ML SOCT Inject 50 mg into the skin once a week. 05/05/20   Gearldine Bienenstock, PA-C  predniSONE (DELTASONE) 5 MG tablet Take 4 tabs po x 4  days, 3  tabs po x 4 days, 2  tabs po x 4 days, 1  tab po x 4 days Patient not taking: Reported on 07/09/2020 05/27/20   Gearldine Bienenstock, PA-C    Family History Family History  Problem Relation Age of Onset  . Mitral valve prolapse Mother        DIED @ 74 YOA OF CONDITION  . Healthy Father   . Mitral valve prolapse Paternal Aunt   . Heart attack Paternal Grandfather        DECEASED  . Dementia Maternal Grandmother   . Healthy Son   . Autism Daughter   . Healthy Daughter   . Healthy Daughter   . Healthy Daughter     Social History Social History   Tobacco Use  . Smoking status: Former Smoker    Packs/day: 0.50    Years: 6.00    Pack years: 3.00    Types: Cigarettes    Quit date: 06/27/2011    Years since quitting: 9.0  . Smokeless tobacco: Never Used  Vaping Use  . Vaping Use: Never used  Substance Use Topics  . Alcohol use: Yes    Comment: occ  . Drug use: No     Allergies   Patient has no known allergies.   Review of Systems Review of Systems  Constitutional: Positive for chills and fever.  HENT: Positive for congestion and sore throat. Negative for ear pain, trouble swallowing and voice change.   Respiratory: Positive for cough. Negative for shortness of breath.   Cardiovascular: Negative for chest pain and palpitations.  Gastrointestinal: Negative for abdominal pain, diarrhea, nausea and vomiting.  Musculoskeletal: Negative for arthralgias, back pain and myalgias.  Skin: Negative for rash.  Neurological: Positive for headaches ( mild). Negative for dizziness and light-headedness.  All other systems reviewed and are negative.    Physical Exam Triage Vital Signs ED Triage Vitals  Enc Vitals Group     BP 07/09/20 0847 125/80     Pulse Rate 07/09/20 0847 92     Resp 07/09/20 0847 16     Temp 07/09/20 0847 98.6 F (37 C)     Temp Source 07/09/20 0847 Oral     SpO2 07/09/20 0847 98 %     Weight 07/09/20 0845 105 lb (47.6 kg)     Height 07/09/20 0845  5\' 6"  (1.676 m)     Head Circumference --      Peak Flow --      Pain Score 07/09/20  1610 3     Pain Loc --      Pain Edu? --      Excl. in GC? --    No data found.  Updated Vital Signs BP 125/80 (BP Location: Right Arm)   Pulse 92   Temp 98.6 F (37 C) (Oral)   Resp 16   Ht 5\' 6"  (1.676 m)   Wt 105 lb (47.6 kg)   LMP 06/08/2020   SpO2 98%   Breastfeeding No   BMI 16.95 kg/m   Visual Acuity Right Eye Distance:   Left Eye Distance:   Bilateral Distance:    Right Eye Near:   Left Eye Near:    Bilateral Near:     Physical Exam Vitals and nursing note reviewed.  Constitutional:      General: She is not in acute distress.    Appearance: Normal appearance. She is well-developed and well-nourished. She is not ill-appearing, toxic-appearing or diaphoretic.  HENT:     Head: Normocephalic and atraumatic.     Right Ear: Tympanic membrane and ear canal normal.     Left Ear: Tympanic membrane and ear canal normal.     Nose: Nose normal.     Mouth/Throat:     Lips: Pink.     Mouth: Mucous membranes are moist.     Pharynx: Oropharynx is clear. Uvula midline. No pharyngeal swelling, oropharyngeal exudate, posterior oropharyngeal erythema or uvula swelling.  Eyes:     Extraocular Movements: EOM normal.  Cardiovascular:     Rate and Rhythm: Normal rate and regular rhythm.  Pulmonary:     Effort: Pulmonary effort is normal. No respiratory distress.     Breath sounds: Normal breath sounds. No stridor. No wheezing, rhonchi or rales.  Musculoskeletal:        General: Normal range of motion.     Cervical back: Normal range of motion.  Skin:    General: Skin is warm and dry.  Neurological:     Mental Status: She is alert and oriented to person, place, and time.  Psychiatric:        Mood and Affect: Mood and affect normal.        Behavior: Behavior normal.      UC Treatments / Results  Labs (all labs ordered are listed, but only abnormal results are displayed) Labs  Reviewed  COVID-19, FLU A+B NAA    EKG   Radiology DG Chest 2 View  Result Date: 07/09/2020 CLINICAL DATA:  Cough and fever with bilateral rib pain, history of recent COVID exposure as well as prior COVID infection in July of 2021 initial encounter EXAM: CHEST - 2 VIEW COMPARISON:  None FINDINGS: The heart size and mediastinal contours are within normal limits. Both lungs are clear. The visualized skeletal structures are unremarkable. Bilateral nipple piercings are noted. IMPRESSION: No active cardiopulmonary disease. Electronically Signed   By: 04-14-1983 M.D.   On: 07/09/2020 09:18    Procedures Procedures (including critical care time)  Medications Ordered in UC Medications - No data to display  Initial Impression / Assessment and Plan / UC Course  I have reviewed the triage vital signs and the nursing notes.  Pertinent labs & imaging results that were available during my care of the patient were reviewed by me and considered in my medical decision making (see chart for details).    No evidence of bacterial infection at this time. COVID/Flu pending Encouraged symptomatic tx F/u with PCP as needed AVS given  Final Clinical Impressions(s) / UC Diagnoses   Final diagnoses:  Viral URI with cough  Close exposure to COVID-19 virus     Discharge Instructions      You may take 500mg  acetaminophen every 4-6 hours or in combination with ibuprofen 400-600mg  every 6-8 hours as needed for pain, inflammation, and fever.  Be sure to well hydrated with clear liquids and get at least 8 hours of sleep at night, preferably more while sick.   Please follow up with family medicine in 1 week if needed.     ED Prescriptions    Medication Sig Dispense Auth. Provider   benzonatate (TESSALON) 100 MG capsule Take 1-2 capsules (100-200 mg total) by mouth every 8 (eight) hours. 21 capsule , Avereigh Spainhower O, PA-C   albuterol (VENTOLIN HFA) 108 (90 Base) MCG/ACT inhaler Inhale 1-2 puffs  into the lungs every 6 (six) hours as needed for wheezing or shortness of breath. 18 g Doroteo Glassman, PA-C     PDMP not reviewed this encounter.   Lurene Shadow, Lurene Shadow 07/09/20 [phone]

## 2020-07-09 NOTE — Discharge Instructions (Addendum)
  You may take 500mg acetaminophen every 4-6 hours or in combination with ibuprofen 400-600mg every 6-8 hours as needed for pain, inflammation, and fever.  Be sure to well hydrated with clear liquids and get at least 8 hours of sleep at night, preferably more while sick.   Please follow up with family medicine in 1 week if needed.   

## 2020-07-11 LAB — COVID-19, FLU A+B NAA
Influenza A, NAA: NOT DETECTED
Influenza B, NAA: NOT DETECTED
SARS-CoV-2, NAA: NOT DETECTED

## 2020-07-25 DIAGNOSIS — Z419 Encounter for procedure for purposes other than remedying health state, unspecified: Secondary | ICD-10-CM | POA: Diagnosis not present

## 2020-08-03 NOTE — Progress Notes (Signed)
Office Visit Note  Patient: Robin [name]             Date of Birth: 18-Oct-1985           MRN: [id number]             PCP: Patient, No Pcp Per Referring: No ref. provider found Visit Date: 08/17/2020 Occupation: @GUAROCC @  Subjective:  Other (Patient would like to discuss enbrel injections. Patient also reports neck pain- she has seen [facility]. )   History of Present Illness: Robin [name] is a 35 y.o. female with history of seropositive rheumatoid arthritis.  She states that about 3 weeks ago she started gluten-free diet and has been avoiding processed sugar and processed food.  She states she developed eczema on her chest with hives.  It was very pruritic.  She was given prednisone by her PCP.  And she was also referred to a dermatologist.  She states she ran out of leflunomide about 2 weeks ago.  She is having pain and swelling in her right second MCP joint and some discomfort in her right foot which she describes over the MTP joints.  She has been taking Enbrel on a regular basis.  She states that she has been on Enbrel for a while but recently she has found that Enbrel injections have become more painful.  She would like to switch to some other biologic agent.  She was also seen by an orthopedic surgeon due to cervical spine pain.  She states she was seen by the PA who felt that there must be some changes in her cervical spine.  She would bring the x-rays at the next visit.  Activities of Daily Living:  Patient reports morning stiffness for 1  hour.    Patient Reports nocturnal pain.  Difficulty dressing/grooming: Denies Difficulty climbing stairs: Denies Difficulty getting out of chair: Denies Difficulty using hands for taps, buttons, cutlery, and/or writing: Reports  Review of Systems  Constitutional: Positive for fatigue.  HENT: Positive for mouth dryness. Negative for mouth sores and nose dryness.   Eyes: Negative for pain, itching and dryness.  Respiratory: Negative for  shortness of breath and difficulty breathing.   Cardiovascular: Negative for chest pain and palpitations.  Gastrointestinal: Negative for blood in stool, constipation and diarrhea.  Endocrine: Negative for increased urination.  Genitourinary: Negative for difficulty urinating.  Musculoskeletal: Positive for arthralgias, joint pain, joint swelling, myalgias, morning stiffness, muscle tenderness and myalgias.  Skin: Positive for rash. Negative for color change.  Allergic/Immunologic: Negative for susceptible to infections.  Neurological: Negative for dizziness, numbness, headaches, memory loss and weakness.  Hematological: Positive for bruising/bleeding tendency.  Psychiatric/Behavioral: Negative for confusion.    PMFS History:  Patient Active Problem List   Diagnosis Date Noted  . Rheumatoid arthritis with rheumatoid factor of multiple sites without organ or systems involvement (HCC) 12/03/2019  . Normal labor 07/09/2018  . Pregnancy with history of neonatal death May 23, 2018  . Depression affecting pregnancy 04/02/2018  . Irregular periods 01/26/2018  . Hereditary disease in family possibly affecting fetus, affecting management of mother, antepartum condition or complication, not applicable or unspecified fetus   . History of preterm delivery 11/17/2017  . Supervision of high risk pregnancy, antepartum 11/17/2017  . Previous cesarean section 11/17/2017  . Family history of congenital anomaly 11/17/2017  . Family history of congenital heart disease 07/27/2015  . Family hx of bicuspid aortic valve--FOB 04/03/2012    Past Medical History:  Diagnosis Date  .  Abnormal Pap smear 2008   HAD HPV;HAD GENITAL WART REMOVED;LAST PAP 07/2010 WAS NORMAL  . Anemia    FeSO4 SUPP TAKEN IN PAST  . Asthma    AS A CHILD;GREW OUT OF @ 7 YOA  . Depression    after the death of her son, was on meds then  . History of smoking 12/26/2011   Reports quit '12  . Infection    UTI;CAN GET FREQ  . Rheumatoid  arthritis (HCC)   . Vaginal Pap smear, abnormal     Family History  Problem Relation Age of Onset  . Mitral valve prolapse Mother        DIED @ 5030 YOA OF CONDITION  . Healthy Father   . Mitral valve prolapse Paternal Aunt   . Heart attack Paternal Grandfather        DECEASED  . Dementia Maternal Grandmother   . Healthy Son   . Autism Daughter   . Healthy Daughter   . Healthy Daughter   . Healthy Daughter    Past Surgical History:  Procedure Laterality Date  . CESAREAN SECTION N/A 09/09/2016   Procedure: CESAREAN SECTION;  Surgeon: Tereso NewcomerUgonna A Anyanwu, MD;  Location: [facility];  Service: Obstetrics;  Laterality: N/A;  . NASAL SINUS SURGERY  2016   Social History   Social History Narrative  . Not on file   There is no immunization history for the selected administration types on file for this patient.   Objective: Vital Signs: BP 109/73 (BP Location: Left Arm, Patient Position: Sitting, Cuff Size: Normal)   Pulse 77   Resp 12   Ht 5\' 6"  (1.676 m)   Wt 106 lb (48.1 kg)   BMI 17.11 kg/m    Physical Exam Vitals and nursing note reviewed.  Constitutional:      Appearance: She is well-developed and well-nourished.  HENT:     Head: Normocephalic and atraumatic.  Eyes:     Extraocular Movements: EOM normal.     Conjunctiva/sclera: Conjunctivae normal.  Cardiovascular:     Rate and Rhythm: Normal rate and regular rhythm.     Pulses: Intact distal pulses.     Heart sounds: Normal heart sounds.  Pulmonary:     Effort: Pulmonary effort is normal.     Breath sounds: Normal breath sounds.  Abdominal:     General: Bowel sounds are normal.     Palpations: Abdomen is soft.  Musculoskeletal:     Cervical back: Normal range of motion.  Lymphadenopathy:     Cervical: No cervical adenopathy.  Skin:    General: Skin is warm and dry.     Capillary Refill: Capillary refill takes less than 2 seconds.  Neurological:     Mental Status: She is alert and oriented to person,  place, and time.  Psychiatric:        Mood and Affect: Mood and affect normal.        Behavior: Behavior normal.      Musculoskeletal Exam: C-spine was in good range of motion.  Shoulder joints, elbow joints, wrist joints, MCPs, PIPs and DIPs with good range of motion.  She has synovitis of her right second MCP joint.  Hip joints, knee joints, ankles, MTPs with good range of motion.  She had tenderness across right foot MTPs.  CDAI Exam: CDAI Score: 2.6  Patient Global: 3 mm; Provider Global: 3 mm Swollen: 1 ; Tender: 2  Joint Exam 08/17/2020      Right  Left  MCP 2  Swollen Tender     MTP 4   Tender        Investigation: No additional findings.  Imaging: No results found.  Recent Labs: Lab Results  Component Value Date   WBC 8.1 03/31/2020   HGB 13.5 03/31/2020   PLT 292 03/31/2020   NA 140 03/31/2020   K 4.5 03/31/2020   CL 104 03/31/2020   CO2 27 03/31/2020   GLUCOSE 72 03/31/2020   BUN 15 03/31/2020   CREATININE 0.81 03/31/2020   BILITOT 0.5 03/31/2020   AST 15 03/31/2020   ALT 10 03/31/2020   PROT 7.2 03/31/2020   CALCIUM 9.7 03/31/2020   GFRAA 111 03/31/2020   QFTBGOLDPLUS NEGATIVE 10/29/2019    Speciality Comments: No specialty comments available.  Procedures:  No procedures performed Allergies: Patient has no known allergies.   Assessment / Plan:     Visit Diagnoses: Rheumatoid arthritis with rheumatoid factor of multiple sites without organ or systems involvement (HCC)-patient states that she ran out of rave about 2 weeks ago.  She is having a flare with pain and swelling in her right second MCP joint.  She is also having some tenderness in the right fourth MTP.  She has been on Arava and Enbrel combination which has been working well for her.  She states that the Enbrel injections on the weekly back basis has become more painful for her.  And she would like to switch to something which is not as frequent and less painful.  We had detailed discussion  regarding different treatment options including Humira, Simponi and Cimzia.  She would like to proceed with Humira.  A handout was given and consent was taken.  She has been also experiencing hives for the last 3 weeks since she made some dietary modifications.  She is currently on prednisone and has an appointment coming up with a dermatologist.  I would hold off switching from Enbrel to Humira until she gets work-up by her dermatologist and the etiology of the hives is established.  Prescription refill for refill was given today.  High risk medication use - Arava 20 mg p.o. daily, Enbrel 50 mg sq injections every week-started on 11/07/2019.   - Plan: CBC with Differential/Platelet, COMPLETE METABOLIC PANEL WITH GFR.  Her labs have been stable.  We will continue to monitor labs every 3 months.  Hives-she has been experiencing hives for the last 3 weeks.  She states it correlates to the time when she switched to detox diet.  She has an appointment coming up with the dermatologist.  Neck pain-she was seen at [facility].  She was told that she has some degenerative changes and possible erosion.  She will bring x-rays at the follow-up visit.  History of preterm delivery  Pregnancy with history of neonatal death  Family hx of bicuspid aortic valve--FOB  Family history of congenital heart disease  She does not wish to get the COVID-19 vaccination.  Have discussed risk of not getting vaccinated at the previous visits.  Use of mask, social distancing and hand hygiene was discussed.  Orders: Orders Placed This Encounter  Procedures  . CBC with Differential/Platelet  . COMPLETE METABOLIC PANEL WITH GFR   Meds ordered this encounter  Medications  . leflunomide (ARAVA) 20 MG tablet    Sig: Take 1 tablet (20 mg total) by mouth daily.    Dispense:  30 tablet    Refill:  2     Follow-Up Instructions: Return in about 3  months (around 11/15/2020) for Rheumatoid arthritis.   Pollyann Savoy,  MD  Note - This record has been created using Animal nutritionist.  Chart creation errors have been sought, but may not always  have been located. Such creation errors do not reflect on  the standard of medical care.

## 2020-08-17 ENCOUNTER — Encounter: Payer: Self-pay | Admitting: Rheumatology

## 2020-08-17 ENCOUNTER — Ambulatory Visit (INDEPENDENT_AMBULATORY_CARE_PROVIDER_SITE_OTHER): Payer: 59 | Admitting: Rheumatology

## 2020-08-17 ENCOUNTER — Other Ambulatory Visit: Payer: Self-pay

## 2020-08-17 ENCOUNTER — Telehealth: Payer: Self-pay | Admitting: Pharmacist

## 2020-08-17 VITALS — BP 109/73 | HR 77 | Resp 12 | Ht 66.0 in | Wt 106.0 lb

## 2020-08-17 DIAGNOSIS — O09299 Supervision of pregnancy with other poor reproductive or obstetric history, unspecified trimester: Secondary | ICD-10-CM

## 2020-08-17 DIAGNOSIS — Z79899 Other long term (current) drug therapy: Secondary | ICD-10-CM

## 2020-08-17 DIAGNOSIS — M0579 Rheumatoid arthritis with rheumatoid factor of multiple sites without organ or systems involvement: Secondary | ICD-10-CM

## 2020-08-17 DIAGNOSIS — Z8774 Personal history of (corrected) congenital malformations of heart and circulatory system: Secondary | ICD-10-CM

## 2020-08-17 DIAGNOSIS — M25522 Pain in left elbow: Secondary | ICD-10-CM

## 2020-08-17 DIAGNOSIS — L509 Urticaria, unspecified: Secondary | ICD-10-CM | POA: Diagnosis not present

## 2020-08-17 DIAGNOSIS — Z8279 Family history of other congenital malformations, deformations and chromosomal abnormalities: Secondary | ICD-10-CM

## 2020-08-17 DIAGNOSIS — Z8751 Personal history of pre-term labor: Secondary | ICD-10-CM

## 2020-08-17 DIAGNOSIS — M542 Cervicalgia: Secondary | ICD-10-CM

## 2020-08-17 MED ORDER — LEFLUNOMIDE 20 MG PO TABS: 20.0000 mg | ORAL_TABLET | Freq: Every day | ORAL | 2 refills | Status: DC

## 2020-08-17 NOTE — Telephone Encounter (Signed)
Please start Humira BIV.  Patient currently has Enbrel auth in place.  She won't start until after her dermatology visit - recommend holding off on starting auth if her current Berkley Harvey will be cancelled. She also qualifies for Humira copay card.  Dx: M05.79 (RA with RA factor of multiple sites)  Dose: Humira 40mg /0.81mL every 14 days  Previous therapies: - MTX tablets as well as SQ injection (d/c due to nausea and inadequate clinical response) - hydroxychloroquine (inadequate clinical response) - both were prescribed by a rheumatologist at [facility].

## 2020-08-17 NOTE — Patient Instructions (Signed)
Adalimumab Injection What is this medicine? ADALIMUMAB (ay da LIM yoo mab) is used to treat rheumatoid and psoriatic arthritis. It is also used to treat ankylosing spondylitis, Crohn's disease, ulcerative colitis, plaque psoriasis, hidradenitis suppurativa, and uveitis. This medicine may be used for other purposes; ask your health care provider or pharmacist if you have questions. COMMON BRAND NAME(S): CYLTEZO, Humira What should I tell my health care provider before I take this medicine? They need to know if you have any of these conditions:  cancer  diabetes (high blood sugar)  having surgery  heart disease  hepatitis B  immune system problems  infections, such as tuberculosis (TB) or other bacterial, fungal, or viral infections  multiple sclerosis  recent or upcoming vaccine  an unusual reaction to adalimumab, mannitol, latex, rubber, other medicines, foods, dyes, or preservatives  pregnant or trying to get pregnant  breast-feeding How should I use this medicine? This medicine is for injection under the skin. You will be taught how to prepare and give it. Take it as directed on the prescription label. Keep taking it unless your health care provider tells you to stop. It is important that you put your used needles and syringes in a special sharps container. Do not put them in a trash can. If you do not have a sharps container, call your pharmacist or health care provider to get one. This medicine comes with INSTRUCTIONS FOR USE. Ask your pharmacist for directions on how to use this medicine. Read the information carefully. Talk to your pharmacist or health care provider if you have questions. A special MedGuide will be given to you by the pharmacist with each prescription and refill. Be sure to read this information carefully each time. Talk to your pediatrician regarding the use of this medicine in children. While this drug may be prescribed for children as young as 2 years for  selected conditions, precautions do apply. Overdosage: If you think you have taken too much of this medicine contact a poison control center or emergency room at once. NOTE: This medicine is only for you. Do not share this medicine with others. What if I miss a dose? If you miss a dose, take it as soon as you can. If it is almost time for your next dose, take only that dose. Do not take double or extra doses. It is important not to miss any doses. Talk to your health care provider about what to do if you miss a dose. What may interact with this medicine? Do not take this medicine with any of the following medications:  abatacept  anakinra  biologic medicines such as certolizumab, etanercept, golimumab, infliximab  live virus vaccines This medicine may also interact with the following medications:  cyclosporine  theophylline  vaccines  warfarin This list may not describe all possible interactions. Give your health care provider a list of all the medicines, herbs, non-prescription drugs, or dietary supplements you use. Also tell them if you smoke, drink alcohol, or use illegal drugs. Some items may interact with your medicine. What should I watch for while using this medicine? Visit your health care provider for regular checks on your progress. Tell your health care provider if your symptoms do not start to get better or if they get worse. You will be tested for tuberculosis (TB) before you start this medicine. If your doctor prescribes any medicine for TB, you should start taking the TB medicine before starting this medicine. Make sure to finish the full course of   TB medicine. This medicine may increase your risk of getting an infection. Call your health care provider for advice if you get a fever, chills, sore throat, or other symptoms of a cold or flu. Do not treat yourself. Try to avoid being around people who are sick. Talk to your health care provider about your risk of cancer. You  may be more at risk for certain types of cancer if you take this medicine. What side effects may I notice from receiving this medicine? Side effects that you should report to your doctor or health care professional as soon as possible:  allergic reactions like skin rash, itching or hives, swelling of the face, lips, or tongue  changes in vision  chest pain  dizziness  heart failure (trouble breathing; fast, irregular heartbeat; sudden weight gain; swelling of the ankles, feet, hands; unusually weak or tired)  infection (fever, chills, cough, sore throat, pain or trouble passing urine)  liver injury (dark yellow or brown urine; general ill feeling or flu-like symptoms; loss of appetite, right upper belly pain; unusually weak or tired, yellowing of the eyes or skin)  lump or swollen lymph nodes on the neck, groin, or underarm area  muscle weakness  pain, tingling, numbness in the hands or feet  red, scaly patches or raised bumps on the skin  trouble breathing  unusual bleeding or bruising  unusually weak or tired Side effects that usually do not require medical attention (report to your doctor or health care professional if they continue or are bothersome):  headache  nausea  pain, redness, or irritation at site where injected  stuffy or runny nose This list may not describe all possible side effects. Call your doctor for medical advice about side effects. You may report side effects to FDA at 1-800-FDA-1088. Where should I keep my medicine? Keep out of the reach of children and pets. Store in the refrigerator between 2 and 8 degrees C (36 and 46 degrees F). Do not freeze. Keep this medicine in the original packaging until you are ready to take it. Protect from light. Get rid of any unused medicine after the expiration date. This medicine may be stored at room temperature for up to 14 days. Keep this medicine in the original packaging. Protect from light. If it is stored at  room temperature, get rid of any unused medicine after 14 days or after it expires, whichever is first. To get rid of medicines that are no longer needed or have expired:  Take the medicine to a medicine take-back program. Check with your pharmacy or law enforcement to find a location.  If you cannot return the medicine, ask your pharmacist or health care provider how to get rid of this medicine safely. NOTE: This sheet is a summary. It may not cover all possible information. If you have questions about this medicine, talk to your doctor, pharmacist, or health care provider.  2021 Elsevier/Gold Standard (2019-09-19 17:28:40)  

## 2020-08-17 NOTE — Progress Notes (Signed)
Counseled patient that Humira is a TNF blocking agent. Patient currently on Enbrel but has reported that it's been painful with redness at injection site - she has been on Enbrel for about a year. Advised that Humira and Enbrel are in the same medication class.  States that Enbrel also isn't managing her RA symptoms anymore either.  Counseled patient on purpose, proper use, and adverse effects of Humira.  We discussed that Humira can be left out at room temperature for up to 14 days.  Reviewed the most common adverse effects including infections, headache, and injection site reactions. Discussed management of injection site reactions with ice, topical hydrocortisone, or Benadryl as needed. Discussed when to call clinic - if site reaction become more red, swollen, or larger than fist.  Discussed that there is the possibility of an increased risk of malignancy but it is not well understood if this increased risk is due to the medication or the disease state.  Advised patient to get yearly dermatology exams due to risk of skin cancer - she has upcoming appointment with dermatologist due to recent hives.  Counseled patient that Humira should be held prior to scheduled surgery, with infections/fever, and with use of antibiotics.  Counseled patient to avoid live vaccines while on Humira.  Advised patient to get annual influenza vaccine and the pneumococcal vaccine as indicated.  Reviewed the importance of regular labs while on Humira therapy.  Standing orders placed.  Provided patient with medication education material and answered all questions.  Patient consented to Humira.  Will upload consent into the media tab.  Reviewed storage instructions of Humira.  Advised initial injection must be administered in office.  Patient verbalized understanding.  Dose will be for rheumatoid arthritis Humira 40 mg every 14 days.  We will not start Humira BIV until after she gets work-up for her hives etiology by  dermatologist.  Current prior auth for Enbrel would be cancelled if we start Humira auth.  Patient continues to takes Arava 20 mg once daily.   Chesley Mires, PharmD, MPH Clinical Pharmacist (Rheumatology and Pulmonology)

## 2020-08-18 ENCOUNTER — Other Ambulatory Visit: Payer: Self-pay | Admitting: Physician Assistant

## 2020-08-18 DIAGNOSIS — M0579 Rheumatoid arthritis with rheumatoid factor of multiple sites without organ or systems involvement: Secondary | ICD-10-CM

## 2020-08-18 LAB — CBC WITH DIFFERENTIAL/PLATELET
Absolute Monocytes: 363 cells/uL (ref 200–950)
Basophils Absolute: 67 cells/uL (ref 0–200)
Basophils Relative: 0.9 %
Eosinophils Absolute: 67 cells/uL (ref 15–500)
Eosinophils Relative: 0.9 %
HCT: 38.5 % (ref 35.0–45.0)
Hemoglobin: 12.7 g/dL (ref 11.7–15.5)
Lymphs Abs: 1406 cells/uL (ref 850–3900)
MCH: 30.4 pg (ref 27.0–33.0)
MCHC: 33 g/dL (ref 32.0–36.0)
MCV: 92.1 fL (ref 80.0–100.0)
MPV: 10.3 fL (ref 7.5–12.5)
Monocytes Relative: 4.9 %
Neutro Abs: 5498 cells/uL (ref 1500–7800)
Neutrophils Relative %: 74.3 %
Platelets: 257 10*3/uL (ref 140–400)
RBC: 4.18 10*6/uL (ref 3.80–5.10)
RDW: 12.2 % (ref 11.0–15.0)
Total Lymphocyte: 19 %
WBC: 7.4 10*3/uL (ref 3.8–10.8)

## 2020-08-18 LAB — COMPLETE METABOLIC PANEL WITH GFR
AG Ratio: 2 (calc) (ref 1.0–2.5)
ALT: 23 U/L (ref 6–29)
AST: 18 U/L (ref 10–30)
Albumin: 4.5 g/dL (ref 3.6–5.1)
Alkaline phosphatase (APISO): 48 U/L (ref 31–125)
BUN: 14 mg/dL (ref 7–25)
CO2: 26 mmol/L (ref 20–32)
Calcium: 9.3 mg/dL (ref 8.6–10.2)
Chloride: 104 mmol/L (ref 98–110)
Creat: 0.72 mg/dL (ref 0.50–1.10)
GFR, Est African American: 128 mL/min/{1.73_m2} (ref >=60)
GFR, Est Non African American: 110 mL/min/{1.73_m2} (ref >=60)
Globulin: 2.3 g/dL (calc) (ref 1.9–3.7)
Glucose, Bld: 80 mg/dL (ref 65–99)
Potassium: 4.1 mmol/L (ref 3.5–5.3)
Sodium: 140 mmol/L (ref 135–146)
Total Bilirubin: 0.3 mg/dL (ref 0.2–1.2)
Total Protein: 6.8 g/dL (ref 6.1–8.1)

## 2020-08-18 NOTE — Telephone Encounter (Signed)
Last Visit: 08/17/2020 Next Visit: 11/20/2020 Labs: 1/24/202, CBC and CMP normal. TB Gold: 10/29/2019, negative  Current Dose per office note 08/17/2020, Enbrel 50 mg sq injections every week-started on 11/07/2019.  [id number] arthritis with rheumatoid factor of multiple sites without organ or systems involvement   Okay to refill Enbrel Mini ?

## 2020-08-18 NOTE — Progress Notes (Signed)
CBC and CMP normal

## 2020-08-18 NOTE — Telephone Encounter (Signed)
Received response from [facility] plan that PA is not needed because plan is secondary. Patient's copay from [facility] should be picked up by her Medicaid secondary plan.

## 2020-08-18 NOTE — Telephone Encounter (Signed)
Patient has 2 insurance plans:  Received notification from [employer] regarding a prior authorization for HUMIRA. Authorization has been APPROVED from12/26/21 to 02/14/21.   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   Submitted a Prior Authorization request to [facility]- Medicaid for HUMIRA via Cover My Meds. Will update once we receive a response.    Key: [id number] - PA Case ID: [id number]

## 2020-08-18 NOTE — Telephone Encounter (Signed)
Submitted a Prior Authorization request to [employer] for HUMIRA via Cover My Meds. Will update once we receive a response.   Key: [id number] - PA Case ID: [id number]

## 2020-08-19 NOTE — Telephone Encounter (Signed)
Called patient to notify. We are awaiting starting Humira until she sees dermatologist. She stated that her appt is Tuesday, 08/25/20. Advised her to have dermatologist send the visit notes to our clinic for review as well.  Will f/u after that appointment.

## 2020-08-25 ENCOUNTER — Telehealth: Payer: Self-pay | Admitting: Rheumatology

## 2020-08-25 DIAGNOSIS — Z419 Encounter for procedure for purposes other than remedying health state, unspecified: Secondary | ICD-10-CM | POA: Diagnosis not present

## 2020-08-25 NOTE — Telephone Encounter (Signed)
Patient calling to let you know she went to the dermatologist today. She has been cleared to start new medication. (Humira) Their office will be sending information to our office in this regard. Please advise patient of next step.

## 2020-08-25 NOTE — Telephone Encounter (Signed)
Consent form on file and patient Humira has been approved.

## 2020-08-25 NOTE — Telephone Encounter (Signed)
I called patient. She states that she was diagnosed with dermographism and eczema. It is okay to proceed with Humira. I have advised her to stop Enbrel a week prior to starting Humira. Please check if patient has a consent on Humira and if we have applied for Humira. Once Humira is approved then we can bring her in the office for her first injection.

## 2020-08-26 MED ORDER — HUMIRA (2 PEN) 40 MG/0.4ML ~~LOC~~ AJKT: 40.0000 mg | AUTO-INJECTOR | SUBCUTANEOUS | 0 refills | Status: DC

## 2020-08-26 NOTE — Telephone Encounter (Signed)
Rx sent to [facility] to be delivered to clinic. Discussed I'll plan to schedule new start once medication is delivered to clinic. Advised her to reach out to [facility] to confirm/consent to shipment of Humira to clinic. F/u will occur in "Humira BIV" encounter.

## 2020-08-26 NOTE — Telephone Encounter (Signed)
Prescription for Humira sent to [facility] for 2 pens. Notated in sig that medication should be delivered to clinic. Advised that [facility] will reach out to her to collect any copay. Copay should be minimal since she has Medicaid as secondary insurance.  Patient's last Enbrel dose was 08/19/20. Due for dose today. Advised her she could go ahead and take Enbrel today and we could schedule Humira start for next Wednesday or Thursday. She verbalized understanding.  Advised patient to call our clinic if she doesn't hear from pharmacy by the end of the week or if copay is unaffordable. Will schedule new start visit once patient's medication arrives at clinic  Chesley Mires, PharmD, MPH Clinical Pharmacist (Rheumatology and Pulmonology)

## 2020-08-26 NOTE — Telephone Encounter (Signed)
Patient must fill though [facility]

## 2020-08-27 NOTE — Telephone Encounter (Signed)
Called [facility], and they have patient's Secondary Medicaid insurance on file. Humira rx was received and in process.

## 2020-08-28 NOTE — Telephone Encounter (Signed)
Per Dr. Corliss Skains, after review of Dermatology note, ok to switch to Humira. Sue Lush called patient to advise.

## 2020-08-31 NOTE — Telephone Encounter (Signed)
Called [facility] - they are still running benefits investigation on patient's prescriptions. Requested rx to be marked as urgent. Advised that patient is aware about needing consent prior to shipping to clinic. Rep confirmed that they have clinic address on file. Will f/u to ensure medication is shipped to clinic this week  Chesley Mires, PharmD, MPH Clinical Pharmacist (Rheumatology and Pulmonology)

## 2020-09-03 NOTE — Telephone Encounter (Signed)
Called [facility] with patient and deliver is scheduled to deliver to office on 09/08/20.

## 2020-09-08 NOTE — Telephone Encounter (Addendum)
Received Humira shipment from [facility]. Left VM for patient to return call so we can schedule new start.  Will route to scheduling team as FYI in case she calls.  Chesley Mires, PharmD, MPH Clinical Pharmacist (Rheumatology and Pulmonology)  Addendum: Patient called and left voicemail to schedule new start - returned patient's call but unable to reach. Left another VM and requested to return call.

## 2020-09-09 NOTE — Telephone Encounter (Signed)
Monday, 09/14/20 at 9:30 am on your schedule

## 2020-09-14 ENCOUNTER — Telehealth: Payer: Self-pay

## 2020-09-14 ENCOUNTER — Telehealth: Payer: Self-pay | Admitting: Emergency Medicine

## 2020-09-14 ENCOUNTER — Other Ambulatory Visit: Payer: Self-pay

## 2020-09-14 ENCOUNTER — Emergency Department (INDEPENDENT_AMBULATORY_CARE_PROVIDER_SITE_OTHER)
Admission: EM | Admit: 2020-09-14 | Discharge: 2020-09-14 | Disposition: A | Discharge status: Home / Self Care | Payer: 59 | Source: Home / Self Care | Attending: Family Medicine | Admitting: Family Medicine

## 2020-09-14 ENCOUNTER — Telehealth: Payer: Self-pay | Admitting: Family Medicine

## 2020-09-14 ENCOUNTER — Ambulatory Visit (INDEPENDENT_AMBULATORY_CARE_PROVIDER_SITE_OTHER): Payer: 59 | Admitting: Pharmacist

## 2020-09-14 VITALS — BP 107/70 | HR 91

## 2020-09-14 DIAGNOSIS — M0579 Rheumatoid arthritis with rheumatoid factor of multiple sites without organ or systems involvement: Secondary | ICD-10-CM

## 2020-09-14 DIAGNOSIS — J029 Acute pharyngitis, unspecified: Secondary | ICD-10-CM

## 2020-09-14 DIAGNOSIS — Z79899 Other long term (current) drug therapy: Secondary | ICD-10-CM

## 2020-09-14 LAB — POCT RAPID STREP A (OFFICE): Rapid Strep A Screen: NEGATIVE

## 2020-09-14 MED ORDER — HUMIRA (2 PEN) 40 MG/0.4ML ~~LOC~~ AJKT: 40.0000 mg | AUTO-INJECTOR | SUBCUTANEOUS | 0 refills | Status: DC

## 2020-09-14 NOTE — ED Provider Notes (Signed)
[name]    CSN: [id number] Arrival date & time: 09/14/20  1049      History   Chief Complaint Chief Complaint  Patient presents with  . Sore Throat    HPI Robin [name] is a 35 y.o. female.   HPI   Patient has 2 young children.  No one at home is sick.  She states that she has had a sore throat for 2 to 3 days.  Is painful to swallow.  She has had some headache.  She has had sweats but has not taken her temperature.  Her son is just recovering from the flu. She did not have Covid vaccinations.  She did have a episode of Covid in July 2021 Patient states that she has rheumatoid arthritis.  She is immune compromised on her Humira. Also mentions gluten allergy  Past Medical History:  Diagnosis Date  . Abnormal Pap smear 2008   HAD HPV;HAD GENITAL WART REMOVED;LAST PAP 07/2010 WAS NORMAL  . Anemia    FeSO4 SUPP TAKEN IN PAST  . Asthma    AS A CHILD;GREW OUT OF @ 8 YOA  . Depression    after the death of her son, was on meds then  . History of smoking 12/26/2011   Reports quit '12  . Infection    UTI;CAN GET FREQ  . Rheumatoid arthritis ([place])   . Vaginal Pap smear, abnormal     Patient Active Problem List   Diagnosis Date Noted  . Rheumatoid arthritis with rheumatoid factor of multiple sites without organ or systems involvement ([place]) 12/03/2019  . Normal labor 07/09/2018  . Pregnancy with history of neonatal death Apr 29, 2018  . Depression affecting pregnancy 04/02/2018  . Irregular periods 01/26/2018  . Hereditary disease in family possibly affecting fetus, affecting management of mother, antepartum condition or complication, not applicable or unspecified fetus   . History of preterm delivery 11/17/2017  . Supervision of high risk pregnancy, antepartum 11/17/2017  . Previous cesarean section 11/17/2017  . Family history of congenital anomaly 11/17/2017  . Family history of congenital heart disease 07/27/2015  . Family hx of bicuspid aortic valve--FOB  04/03/2012    Past Surgical History:  Procedure Laterality Date  . CESAREAN SECTION N/A 09/09/2016   Procedure: CESAREAN SECTION;  Surgeon: Osborne Oman, MD;  Location: [facility];  Service: Obstetrics;  Laterality: N/A;  . NASAL SINUS SURGERY  2016    OB History    Gravida  6   Para  5   Term  4   Preterm  1   AB  1   Living  4     SAB  1   IAB      Ectopic      Multiple  0   Live Births  5            Home Medications    Prior to Admission medications   Medication Sig Start Date End Date Taking? Authorizing Provider  Adalimumab (HUMIRA PEN) 40 MG/0.4ML PNKT Inject 40 mg into the skin every 14 (fourteen) days. 1 kit = 2 pens. Deliver to patient's home. 09/14/20   Bo Merino, MD  ascorbic acid (VITAMIN C) 1000 MG tablet Take by mouth.    [provider]  cetirizine (ZYRTEC) 10 MG tablet Take 10 mg by mouth daily.    [provider]  Cholecalciferol 125 MCG (5000 UT) capsule Take by mouth.    [provider]  diphenhydrAMINE HCl (BENADRYL ALLERGY  PO) Take by mouth as needed.    [provider]  escitalopram (LEXAPRO) 20 MG tablet Take 20 mg by mouth daily. 07/21/20 07/21/21  [provider]  fluticasone (FLONASE) 50 MCG/ACT nasal spray Place into both nostrils 2 (two) times daily.    [provider]  leflunomide (ARAVA) 20 MG tablet Take 1 tablet (20 mg total) by mouth daily. 08/17/20   Bo Merino, MD  levocetirizine (XYZAL) 5 MG tablet Take by mouth at bedtime. 07/21/20 07/21/21  [provider]  PREVIDENT 5000 BOOSTER PLUS 1.1 % PSTE Place onto teeth as directed. 01/15/20   [provider]  Zinc 30 MG CAPS Take 1 tablet by mouth daily.    [provider]    Family History Family History  Problem Relation Age of Onset  . Mitral valve prolapse Mother        DIED @ 26 YOA OF CONDITION  . Healthy Father   . Mitral valve prolapse Paternal Aunt   . Heart  attack Paternal Grandfather        DECEASED  . Dementia Maternal Grandmother   . Healthy Son   . Autism Daughter   . Healthy Daughter   . Healthy Daughter   . Healthy Daughter     Social History Social History   Tobacco Use  . Smoking status: Former Smoker    Packs/day: 0.50    Years: 6.00    Pack years: 3.00    Types: Cigarettes    Quit date: 06/27/2011    Years since quitting: 9.2  . Smokeless tobacco: Never Used  Vaping Use  . Vaping Use: Never used  Substance Use Topics  . Alcohol use: Yes    Comment: occ  . Drug use: No     Allergies   Patient has no known allergies.   Review of Systems Review of Systems See HPI  Physical Exam Triage Vital Signs ED Triage Vitals  Enc Vitals Group     BP 09/14/20 1124 114/76     Pulse Rate 09/14/20 1124 77     Resp 09/14/20 1124 17     Temp 09/14/20 1124 98.6 F (37 C)     Temp Source 09/14/20 1124 Oral     SpO2 09/14/20 1124 99 %     Weight --      Height --      Head Circumference --      Peak Flow --      Pain Score 09/14/20 1125 4     Pain Loc --      Pain Edu? --      Excl. in [place]? --    No data found.  Updated Vital Signs BP 114/76 (BP Location: Right Arm)   Pulse 77   Temp 98.6 F (37 C) (Oral)   Resp 17   LMP 08/10/2020 (Exact Date)   SpO2 99%      Physical Exam Constitutional:      General: She is not in acute distress.    Appearance: She is well-developed, normal weight and well-nourished.  HENT:     Head: Normocephalic and atraumatic.     Right Ear: Tympanic membrane and ear canal normal.     Left Ear: Tympanic membrane and ear canal normal.     Nose: No congestion.     Mouth/Throat:     Mouth: Mucous membranes are moist.     Pharynx: Uvula midline. Posterior oropharyngeal erythema present.     Tonsils: No tonsillar exudate  or tonsillar abscesses. 1+ on the right. 1+ on the left.  Eyes:     Conjunctiva/sclera: Conjunctivae normal.     Pupils: Pupils are equal, round, and reactive to  light.  Cardiovascular:     Rate and Rhythm: Normal rate.  Pulmonary:     Effort: Pulmonary effort is normal. No respiratory distress.  Abdominal:     General: There is no distension.     Palpations: Abdomen is soft.  Musculoskeletal:        General: No edema. Normal range of motion.     Cervical back: Normal range of motion.  Lymphadenopathy:     Cervical: No cervical adenopathy.  Skin:    General: Skin is warm and dry.  Neurological:     Mental Status: She is alert.  Psychiatric:        Behavior: Behavior normal.      UC Treatments / Results  Labs (all labs ordered are listed, but only abnormal results are displayed) Labs Reviewed  SARS-COV-2 RNA,(COVID-19) QUALITATIVE NAAT  POCT RAPID STREP A (OFFICE)    EKG   Radiology No results found.  Procedures Procedures (including critical care time)  Medications Ordered in UC Medications - No data to display  Initial Impression / Assessment and Plan / UC Course  I have reviewed the triage vital signs and the nursing notes.  Pertinent labs & imaging results that were available during my care of the patient were reviewed by me and considered in my medical decision making (see chart for details).     Rapid strep is negative.  Covid test is sent.  Symptomatic care is reviewed. Final Clinical Impressions(s) / UC Diagnoses   Final diagnoses:  Acute pharyngitis, unspecified etiology     Discharge Instructions     The rapid strep test is negative This is a viral sore throat It is possible that this is Covid, you must quarantine until your result is available Continue with Tylenol or ibuprofen for pain, increased fluid, lozenges Your test results will be available on MyChart    ED Prescriptions    None     PDMP not reviewed this encounter.   Raylene Everts, MD 09/14/20 1320

## 2020-09-14 NOTE — Telephone Encounter (Signed)
Event opened to place order for strep culture per Dr. Delton See.

## 2020-09-14 NOTE — Telephone Encounter (Signed)
Encounter to place Strep B culture.

## 2020-09-14 NOTE — Telephone Encounter (Signed)
Ordering strep test

## 2020-09-14 NOTE — ED Triage Notes (Signed)
Pt c/o sore throat x 3 days. Hurts to swallow and can only eat soft foods currently. Son recently dx with flu. Denies fever. Also c/o slight headache and post nasal drip. States she has covid in July 2021. Has not been vaccinated. Salt water gargles and lozenges prn.

## 2020-09-14 NOTE — Progress Notes (Signed)
Pharmacy Note  Subjective:   Patient presents to clinic today to receive first dose of Humira. She was previously on Enbrel and Arava combination therapy. However, she reported that it's been painful with redness at injection site - she has been on Enbrel for about a year. Advised that Humira and Enbrel are in the same medication class.  States that Enbrel also wasn't managing her RA symptoms anymore either.  Her last Enbrel dose was 2 weeks ago - she reports that she hasn't had any major RA flares. Does have some slight discomfort but hasn't affected day to day life.  Patient running a fever or have signs/symptoms of infection? No  Patient currently on antibiotics for the treatment of infection? No  Patient have any upcoming invasive procedures/surgeries? No  Objective: CMP     Component Value Date/Time   NA 140 08/17/2020 1159   K 4.1 08/17/2020 1159   CL 104 08/17/2020 1159   CO2 26 08/17/2020 1159   GLUCOSE 80 08/17/2020 1159   BUN 14 08/17/2020 1159   CREATININE 0.72 08/17/2020 1159   CALCIUM 9.3 08/17/2020 1159   PROT 6.8 08/17/2020 1159   AST 18 08/17/2020 1159   ALT 23 08/17/2020 1159   BILITOT 0.3 08/17/2020 1159   GFRNONAA 110 08/17/2020 1159   GFRAA 128 08/17/2020 1159    CBC    Component Value Date/Time   WBC 7.4 08/17/2020 1159   RBC 4.18 08/17/2020 1159   HGB 12.7 08/17/2020 1159   HCT 38.5 08/17/2020 1159   PLT 257 08/17/2020 1159   MCV 92.1 08/17/2020 1159   MCH 30.4 08/17/2020 1159   MCHC 33.0 08/17/2020 1159   RDW 12.2 08/17/2020 1159   LYMPHSABS 1,406 08/17/2020 1159   MONOABS 0.5 07/08/2016 1748   EOSABS 67 08/17/2020 1159   BASOSABS 67 08/17/2020 1159    Baseline Immunosuppressant Therapy Labs TB GOLD Quantiferon TB Gold Latest Ref Rng & Units 10/29/2019  Quantiferon TB Gold Plus NEGATIVE NEGATIVE   Hepatitis Panel Hepatitis Latest Ref Rng & Units 10/29/2019  Hep B Surface Ag NON-REACTI NON-REACTIVE  Hep B IgM NON-REACTI NON-REACTIVE    HIV Lab Results  Component Value Date   HIV NON-REACTIVE 04/27/2018   HIV NON-REACTIVE 12/01/2017   HIV Non Reactive 09/06/2016   HIV NONREACTIVE 05/04/2016   HIV NONREACTIVE 09/07/2015   Immunoglobulins Immunoglobulin Electrophoresis Latest Ref Rng & Units 10/29/2019  IgA  47 - 310 mg/dL 160  IgG 109 - 3,235 mg/dL 5,732  IgM 50 - 202 mg/dL 98   SPEP Serum Protein Electrophoresis Latest Ref Rng & Units 08/17/2020  Total Protein 6.1 - 8.1 g/dL 6.8  Albumin 3.8 - 4.8 g/dL -  Alpha-1 0.2 - 0.3 g/dL -  Alpha-2 0.5 - 0.9 g/dL -  Beta Globulin 0.4 - 0.6 g/dL -  Beta 2 0.2 - 0.5 g/dL -  Gamma Globulin 0.8 - 1.7 g/dL -   Chest x-ray: 54/27/06 wnl  Assessment/Plan:  Patient's Humira dose will be 40 mg SQ every 14 days. She will continue leflunomide 20mg  once daily with Humira.  Demonstrated proper injection technique with Humira demo device  Patient able to demonstrate proper injection technique using the teach back method.  Patient self injected in the left lower abdomen with:  Pharmacy-dispensed Medication: Humira 40mg /0.4 mL pen NDC: Lot: Expiration: 02/2022  Patient tolerated well.  Observed for 30 mins in office for adverse reaction.   Patient is to return in 1 month for labs and f/u OV  scheduled with Dr. Corliss Skains on 11/20/20.  Standing CBC with diff/platelet and CMP with GFR orders renewed. Future order for TB gold placed. She will be due in April 2022.  Humira approved through insurance.  Prescription for 1 month sent to [facility] - pharmacy had shipped 2 pens (1 box) to clinic due to short sample stock in clinic.  Patient provided with remaining pen in cooler as well as sharps box delivered by pharmacy. Patient provided with [facility] phone number.  All questions encouraged and answered.  Instructed patient to call with any further questions or concerns.  Chesley Mires, PharmD, MPH Clinical Pharmacist (Rheumatology  and Pulmonology)  09/14/2020 9:58 AM

## 2020-09-14 NOTE — Discharge Instructions (Addendum)
The rapid strep test is negative This is a viral sore throat It is possible that this is Covid, you must quarantine until your result is available Continue with Tylenol or ibuprofen for pain, increased fluid, lozenges Your test results will be available on MyChart

## 2020-09-14 NOTE — Patient Instructions (Addendum)
Your Humira dose will be 40mg  (1 pen) every 14 days. Next dose is due 09/28/20, 10/12/20, and every 2 weeks thereafter. Please set a reminder in your phone.  A prescription for 1 month was sent to [facility]. Their phone number is [phone]. Please call to schedule shipment.  You are due for labs in 1 month (the week that you inject your dose on 10/12/20).  Standing Labs Please have your standing labs drawn in 1 month then every 3 months  If possible, please have your labs drawn 2 weeks prior to your appointment so that the provider can discuss your results at your appointment.  We have open lab daily Monday through Thursday from 1:30-4:30 PM and Friday from 1:30-4:00 PM at the office of Dr. Wednesday, [facility].   Please be advised, all patients with office appointments requiring lab work will take precedents over walk-in lab work.  If possible, please come for your lab work on Monday and Friday afternoons, as you may experience shorter wait times. The office is located at [address], [address], [place], [place] No appointment is necessary.   Labs are drawn by [facility]. Please bring your co-pay at the time of your lab draw.  You may receive a bill from [facility] for your lab work.  If you wish to have your labs drawn at another location, please call the office 24 hours in advance to send orders.  If you have any questions regarding directions or hours of operation,  please call [phone].   As a reminder, please drink plenty of water prior to coming for your lab work. Thanks!  Remember the 5 C's:  COUNTER - leave on the counter at least 30 minutes but up to overnight to bring medication to room temperature. This may help prevent stinging  COLD - place something cold (like an ice gel pack or cold water bottle) on the injection site just before cleansing with alcohol. This may help reduce pain  CLARITIN - use Claritin (generic name is  loratadine) for the first two weeks of treatment or the day of, the day before, and the day after injecting. This will help to minimize injection site reactions  CORTISONE CREAM - apply if injection site is irritated and itching  CALL ME - if injection site reaction is bigger than the size of your fist, looks infected, blisters, or if you develop hives  Adalimumab Injection What is this medicine? ADALIMUMAB (ay da LIM yoo mab) is used to treat rheumatoid and psoriatic arthritis. It is also used to treat ankylosing spondylitis, Crohn's disease, ulcerative colitis, plaque psoriasis, hidradenitis suppurativa, and uveitis. This medicine may be used for other purposes; ask your health care provider or pharmacist if you have questions. COMMON BRAND NAME(S): CYLTEZO, Humira What should I tell my health care provider before I take this medicine? They need to know if you have any of these conditions:  cancer  diabetes (high blood sugar)  having surgery  heart disease  hepatitis B  immune system problems  infections, such as tuberculosis (TB) or other bacterial, fungal, or viral infections  multiple sclerosis  recent or upcoming vaccine  an unusual reaction to adalimumab, mannitol, latex, rubber, other medicines, foods, dyes, or preservatives  pregnant or trying to get pregnant  breast-feeding How should I use this medicine? This medicine is for injection under the skin. You will be taught how to prepare and give it. Take it as directed on the prescription label. Keep  taking it unless your health care provider tells you to stop. It is important that you put your used needles and syringes in a special sharps container. Do not put them in a trash can. If you do not have a sharps container, call your pharmacist or health care provider to get one. This medicine comes with INSTRUCTIONS FOR USE. Ask your pharmacist for directions on how to use this medicine. Read the information carefully.  Talk to your pharmacist or health care provider if you have questions. A special MedGuide will be given to you by the pharmacist with each prescription and refill. Be sure to read this information carefully each time. Talk to your pediatrician regarding the use of this medicine in children. While this drug may be prescribed for children as young as 2 years for selected conditions, precautions do apply. Overdosage: If you think you have taken too much of this medicine contact a poison control center or emergency room at once. NOTE: This medicine is only for you. Do not share this medicine with others. What if I miss a dose? If you miss a dose, take it as soon as you can. If it is almost time for your next dose, take only that dose. Do not take double or extra doses. It is important not to miss any doses. Talk to your health care provider about what to do if you miss a dose. What may interact with this medicine? Do not take this medicine with any of the following medications:  abatacept  anakinra  biologic medicines such as certolizumab, etanercept, golimumab, infliximab  live virus vaccines This medicine may also interact with the following medications:  cyclosporine  theophylline  vaccines  warfarin This list may not describe all possible interactions. Give your health care provider a list of all the medicines, herbs, non-prescription drugs, or dietary supplements you use. Also tell them if you smoke, drink alcohol, or use illegal drugs. Some items may interact with your medicine. What should I watch for while using this medicine? Visit your health care provider for regular checks on your progress. Tell your health care provider if your symptoms do not start to get better or if they get worse. You will be tested for tuberculosis (TB) before you start this medicine. If your doctor prescribes any medicine for TB, you should start taking the TB medicine before starting this medicine. Make  sure to finish the full course of TB medicine. This medicine may increase your risk of getting an infection. Call your health care provider for advice if you get a fever, chills, sore throat, or other symptoms of a cold or flu. Do not treat yourself. Try to avoid being around people who are sick. Talk to your health care provider about your risk of cancer. You may be more at risk for certain types of cancer if you take this medicine. What side effects may I notice from receiving this medicine? Side effects that you should report to your doctor or health care professional as soon as possible:  allergic reactions like skin rash, itching or hives, swelling of the face, lips, or tongue  changes in vision  chest pain  dizziness  heart failure (trouble breathing; fast, irregular heartbeat; sudden weight gain; swelling of the ankles, feet, hands; unusually weak or tired)  infection (fever, chills, cough, sore throat, pain or trouble passing urine)  liver injury (dark yellow or brown urine; general ill feeling or flu-like symptoms; loss of appetite, right upper belly pain; unusually weak  or tired, yellowing of the eyes or skin)  lump or swollen lymph nodes on the neck, groin, or underarm area  muscle weakness  pain, tingling, numbness in the hands or feet  red, scaly patches or raised bumps on the skin  trouble breathing  unusual bleeding or bruising  unusually weak or tired Side effects that usually do not require medical attention (report to your doctor or health care professional if they continue or are bothersome):  headache  nausea  pain, redness, or irritation at site where injected  stuffy or runny nose This list may not describe all possible side effects. Call your doctor for medical advice about side effects. You may report side effects to FDA at 1-800-FDA-1088. Where should I keep my medicine? Keep out of the reach of children and pets. Store in the refrigerator between  2 and 8 degrees C (36 and 46 degrees F). Do not freeze. Keep this medicine in the original packaging until you are ready to take it. Protect from light. Get rid of any unused medicine after the expiration date. This medicine may be stored at room temperature for up to 14 days. Keep this medicine in the original packaging. Protect from light. If it is stored at room temperature, get rid of any unused medicine after 14 days or after it expires, whichever is first. To get rid of medicines that are no longer needed or have expired:  Take the medicine to a medicine take-back program. Check with your pharmacy or law enforcement to find a location.  If you cannot return the medicine, ask your pharmacist or health care provider how to get rid of this medicine safely. NOTE: This sheet is a summary. It may not cover all possible information. If you have questions about this medicine, talk to your doctor, pharmacist, or health care provider.  2021 Elsevier/Gold Standard (2019-09-19 17:28:40)

## 2020-09-15 LAB — SARS-COV-2 RNA,(COVID-19) QUALITATIVE NAAT: SARS CoV2 RNA: NOT DETECTED

## 2020-09-16 LAB — CULTURE, GROUP A STREP
MICRO NUMBER:: 11560473
SPECIMEN QUALITY:: ADEQUATE

## 2020-09-17 ENCOUNTER — Telehealth: Payer: Self-pay

## 2020-09-17 NOTE — Telephone Encounter (Signed)
Pt called stating she started her period a few days ago and today it is heavier than normal. Pt states she had a "half dollar size blood clot" come out today. Pt was instructed to monitor it and see if it improves. Pt also given option to come in to the office for an appt today at 3:00. Pt declined appt and will monitor bleeding. Pt instructed to call if the bleeding becomes heavier.

## 2020-09-21 DIAGNOSIS — F419 Anxiety disorder, unspecified: Secondary | ICD-10-CM | POA: Diagnosis not present

## 2020-09-22 DIAGNOSIS — Z419 Encounter for procedure for purposes other than remedying health state, unspecified: Secondary | ICD-10-CM | POA: Diagnosis not present

## 2020-09-22 DIAGNOSIS — L209 Atopic dermatitis, unspecified: Secondary | ICD-10-CM | POA: Diagnosis not present

## 2020-09-22 DIAGNOSIS — L501 Idiopathic urticaria: Secondary | ICD-10-CM | POA: Diagnosis not present

## 2020-10-12 ENCOUNTER — Telehealth: Payer: Self-pay | Admitting: Rheumatology

## 2020-10-12 NOTE — Telephone Encounter (Addendum)
Patient no longer has commercial plan. She has only Medicaid as primary plan now. Confirmed that Medicaid plan is still active. Will re-submit prior authorization through Medicaid.    Sample set aside in fridge for patient to pick up today. Medication Samples have been provided to the patient.  Drug name: Humira 40mg /0.67ml autoinjector Qty: 1 pen LOT: 11m Exp.Date: 07/2021  Sample has NOT been logged out in binder.  Key: [id number]  08/2021, PharmD, MPH Clinical Pharmacist (Rheumatology and Pulmonology)

## 2020-10-12 NOTE — Telephone Encounter (Signed)
Patient picked up sample 

## 2020-10-12 NOTE — Telephone Encounter (Signed)
Patient calling stating a PA is required for her Humira.  Patient is due for injection today, and is completely out. Can patient get a sample? Please call patient to advise.

## 2020-10-12 NOTE — Telephone Encounter (Signed)
   Medication Samples have been provided to the patient.  Drug name: Humira 40mg /0.14ml autoinjector Strength: 40mg /0.69ml Qty:1 LOT: Exp.Date: 07/2021

## 2020-10-13 NOTE — Telephone Encounter (Signed)
Faxed Clinical documentation to [facility] for Humira PA. Will update once we receive a response.  Phone#[phone] Fax# [phone]

## 2020-10-15 ENCOUNTER — Other Ambulatory Visit: Payer: Self-pay | Admitting: Pharmacist

## 2020-10-15 DIAGNOSIS — M0579 Rheumatoid arthritis with rheumatoid factor of multiple sites without organ or systems involvement: Secondary | ICD-10-CM

## 2020-10-15 NOTE — Telephone Encounter (Signed)
Received notification that patient needs refill on Humira, however she is due for labs. She will stop by next week to have labs drawn - she is aware of walk-in lab hours. Will send refill after that. She took Humira this past Monday.  Patient will be due for CBC, CMP, and TB gold. Last TB gold was negative on 10/29/19  [facility] PA process is still pending since her primary lapsed

## 2020-10-20 ENCOUNTER — Other Ambulatory Visit: Payer: Self-pay

## 2020-10-20 ENCOUNTER — Other Ambulatory Visit: Payer: Self-pay | Admitting: *Deleted

## 2020-10-20 DIAGNOSIS — Z79899 Other long term (current) drug therapy: Secondary | ICD-10-CM

## 2020-10-20 DIAGNOSIS — M0579 Rheumatoid arthritis with rheumatoid factor of multiple sites without organ or systems involvement: Secondary | ICD-10-CM

## 2020-10-20 MED ORDER — HUMIRA (2 PEN) 40 MG/0.4ML ~~LOC~~ AJKT: 40.0000 mg | AUTO-INJECTOR | SUBCUTANEOUS | 0 refills | Status: DC

## 2020-10-20 NOTE — Telephone Encounter (Signed)
RF from [facility]  Next Visit: 11/20/2020  Last Visit: 08/17/2020 OV, 09/14/2020 Devki  Last Fill: 09/14/2020  DX:  Rheumatoid arthritis with rheumatoid factor of multiple sites without organ or systems involvement   Current Dose per office note 08/17/2020, Humira dose will be 40 mg SQ every 14 days  Labs: 08/17/2020, CBC and CMP normal.  TB Gold: 10/29/2019 negative  Okay to refill Humira?

## 2020-10-20 NOTE — Telephone Encounter (Signed)
Called [employer] to follow up on Pending PA- Previous denial on file, so appeal will need ot be submitted due to patient no longer having primary [employer]. Rep will fax over appeal forms.  Phone# [phone]

## 2020-10-22 LAB — CBC WITH DIFFERENTIAL/PLATELET
Absolute Monocytes: 802 cells/uL (ref 200–950)
Basophils Absolute: 91 cells/uL (ref 0–200)
Basophils Relative: 1.9 %
Eosinophils Absolute: 331 cells/uL (ref 15–500)
Eosinophils Relative: 6.9 %
HCT: 42.5 % (ref 35.0–45.0)
Hemoglobin: 13.8 g/dL (ref 11.7–15.5)
Lymphs Abs: 1267 cells/uL (ref 850–3900)
MCH: 30.9 pg (ref 27.0–33.0)
MCHC: 32.5 g/dL (ref 32.0–36.0)
MCV: 95.3 fL (ref 80.0–100.0)
MPV: 10.3 fL (ref 7.5–12.5)
Monocytes Relative: 16.7 %
Neutro Abs: 2309 cells/uL (ref 1500–7800)
Neutrophils Relative %: 48.1 %
Platelets: 245 10*3/uL (ref 140–400)
RBC: 4.46 10*6/uL (ref 3.80–5.10)
RDW: 11.8 % (ref 11.0–15.0)
Total Lymphocyte: 26.4 %
WBC: 4.8 10*3/uL (ref 3.8–10.8)

## 2020-10-22 LAB — COMPLETE METABOLIC PANEL WITH GFR
AG Ratio: 1.9 (calc) (ref 1.0–2.5)
ALT: 21 U/L (ref 6–29)
AST: 16 U/L (ref 10–30)
Albumin: 4.3 g/dL (ref 3.6–5.1)
Alkaline phosphatase (APISO): 51 U/L (ref 31–125)
BUN: 13 mg/dL (ref 7–25)
CO2: 33 mmol/L — ABNORMAL HIGH (ref 20–32)
Calcium: 9.6 mg/dL (ref 8.6–10.2)
Chloride: 103 mmol/L (ref 98–110)
Creat: 0.7 mg/dL (ref 0.50–1.10)
GFR, Est African American: 132 mL/min/{1.73_m2} (ref >=60)
GFR, Est Non African American: 114 mL/min/{1.73_m2} (ref >=60)
Globulin: 2.3 g/dL (calc) (ref 1.9–3.7)
Glucose, Bld: 58 mg/dL — ABNORMAL LOW (ref 65–99)
Potassium: 4.1 mmol/L (ref 3.5–5.3)
Sodium: 142 mmol/L (ref 135–146)
Total Bilirubin: 0.2 mg/dL (ref 0.2–1.2)
Total Protein: 6.6 g/dL (ref 6.1–8.1)

## 2020-10-22 LAB — QUANTIFERON-TB GOLD PLUS
Mitogen-NIL: >10 IU/mL
NIL: 0.04 IU/mL
QuantiFERON-TB Gold Plus: NEGATIVE
TB1-NIL: 0.01 IU/mL
TB2-NIL: 0 IU/mL

## 2020-10-23 ENCOUNTER — Telehealth: Payer: Self-pay | Admitting: Rheumatology

## 2020-10-23 DIAGNOSIS — Z419 Encounter for procedure for purposes other than remedying health state, unspecified: Secondary | ICD-10-CM | POA: Diagnosis not present

## 2020-10-23 NOTE — Telephone Encounter (Signed)
Patient is having trouble getting her Humira. Pharmacy telling her an appeal needs to be submitted due to original request being filed as medical not pharmaceutical. Patient is due for next injection Monday, and does not have any.

## 2020-10-26 NOTE — Telephone Encounter (Signed)
Medication Samples have been provided to the patient.  Drug name: Humira 30 mg/0.4 ml pen Strength: 40 mn/0.04 ml      Qty: 1 LOT: [id number] Exp.Date: Jan 2023  Dosing instructions: Inject 40 mg into the skin every 14 days.

## 2020-10-26 NOTE — Telephone Encounter (Signed)
Patient will stop by clinic for Humira sample today and sign appeal forms that will allow [place] to submit appeal on her behalf.  Sample marked in the fridge but not yet logged out in binder

## 2020-10-26 NOTE — Telephone Encounter (Signed)
Patient will stop by clinic for Humira sample today and sign [employer] appeal forms that will allow [place] to submit appeal on her behalf. Appeal letter already complete but cannot submit without patient's written permission  Sample marked in the fridge but not yet logged out in binder

## 2020-10-27 ENCOUNTER — Telehealth: Payer: Self-pay

## 2020-10-27 NOTE — Telephone Encounter (Signed)
Called Clinic and advised we are working on appeal for patient's Humira. No more notifications are needed.

## 2020-10-27 NOTE — Telephone Encounter (Signed)
Robin [name] from [employer] left a voicemail stating a prior authorization is required for patient's Humira through [employer] [place].  Please contact [employer] to initiate that prior authorization for the Humira and if you have questions regarding my message, please feel free to call back at #[phone]

## 2020-10-27 NOTE — Telephone Encounter (Addendum)
Submitted appeal to [facility] for Humira. Denial reason was that patient has primary insurance which lapsed 09/24/20.  Fax: [phone] Phone: [phone]  Chesley Mires, PharmD, MPH Clinical Pharmacist (Rheumatology and Pulmonology)  Addendum: Received notification from [facility] that they have receive patient's Expedited Appeal request

## 2020-10-28 ENCOUNTER — Other Ambulatory Visit: Payer: Self-pay | Admitting: Pharmacist

## 2020-10-28 ENCOUNTER — Other Ambulatory Visit (HOSPITAL_COMMUNITY): Payer: Self-pay

## 2020-10-28 DIAGNOSIS — M0579 Rheumatoid arthritis with rheumatoid factor of multiple sites without organ or systems involvement: Secondary | ICD-10-CM

## 2020-10-28 MED ORDER — HUMIRA (2 PEN) 40 MG/0.4ML ~~LOC~~ AJKT
40.0000 mg | AUTO-INJECTOR | SUBCUTANEOUS | 0 refills | Status: DC
  Filled 2020-10-28: qty 2, 28d supply, fill #0
  Filled 2020-10-28: qty 6, fill #0
  Filled 2020-11-25: qty 2, 28d supply, fill #1
  Filled 2021-01-04: qty 2, 28d supply, fill #2

## 2020-10-28 NOTE — Telephone Encounter (Addendum)
Received notification from [facility] regarding a prior authorization for HUMIRA - appeal led to overturning of medication denial. Authorization has been APPROVED from 3/225/22 to 10/27/21 . Copay is $0 for 28 days.  Patient states address on file is correct shipping address for medication to be sent to. Can deliver any day of the week except Wednesday.  Authorization # [id number] Phone # [phone]  Called patient to notify that rx will be sent to [facility] for her Humira.  Patient states that her husband started his job 3 weeks ago and the new insurance won't start until 90 days after starting. Advised her to reach out to our clinic to provide this information when she receives it so we can run the prior authorization urgently. She verbalized understanding.  Chesley Mires, PharmD, MPH Clinical Pharmacist (Rheumatology and Pulmonology)

## 2020-10-28 NOTE — Telephone Encounter (Signed)
Received notification from [facility] regarding a prior authorization for HUMIRA - appeal led to overturning of medication denial. Authorization has been APPROVED from 10/16/20 to 10/27/21 . Copay is $0 for 28 days.  Patient states address on file is correct shipping address for medication to be sent to. Can deliver any day of the week except Wednesday.  Called patient to notify that rx will be sent to [facility] for her Humira.  Chesley Mires, PharmD, MPH Clinical Pharmacist (Rheumatology and Pulmonology)

## 2020-11-09 NOTE — Progress Notes (Deleted)
Office Visit Note  Patient: Robin [name]             Date of Birth: 06/22/1986           MRN: [id number]             PCP: Patient, No Pcp Per (Inactive) Referring: No ref. provider found Visit Date: 11/20/2020 Occupation: @GUAROCC @  Subjective:  No chief complaint on file.   History of Present Illness: Robin [name] is a 35 y.o. female ***   Activities of Daily Living:  Patient reports morning stiffness for *** {minute/hour:19697}.   Patient {ACTIONS;DENIES/REPORTS:21021675::"Denies"} nocturnal pain.  Difficulty dressing/grooming: {ACTIONS;DENIES/REPORTS:21021675::"Denies"} Difficulty climbing stairs: {ACTIONS;DENIES/REPORTS:21021675::"Denies"} Difficulty getting out of chair: {ACTIONS;DENIES/REPORTS:21021675::"Denies"} Difficulty using hands for taps, buttons, cutlery, and/or writing: {ACTIONS;DENIES/REPORTS:21021675::"Denies"}  No Rheumatology ROS completed.   PMFS History:  Patient Active Problem List   Diagnosis Date Noted  . Rheumatoid arthritis with rheumatoid factor of multiple sites without organ or systems involvement (HCC) 12/03/2019  . Normal labor 07/09/2018  . Pregnancy with history of neonatal death 2018-05-22  . Depression affecting pregnancy 04/02/2018  . Irregular periods 01/26/2018  . Hereditary disease in family possibly affecting fetus, affecting management of mother, antepartum condition or complication, not applicable or unspecified fetus   . History of preterm delivery 11/17/2017  . Supervision of high risk pregnancy, antepartum 11/17/2017  . Previous cesarean section 11/17/2017  . Family history of congenital anomaly 11/17/2017  . Family history of congenital heart disease 07/27/2015  . Family hx of bicuspid aortic valve--FOB 04/03/2012    Past Medical History:  Diagnosis Date  . Abnormal Pap smear 2008   HAD HPV;HAD GENITAL WART REMOVED;LAST PAP 07/2010 WAS NORMAL  . Anemia    FeSO4 SUPP TAKEN IN PAST  . Asthma    AS A CHILD;GREW OUT OF @  8 YOA  . Depression    after the death of her son, was on meds then  . History of smoking 12/26/2011   Reports quit '12  . Infection    UTI;CAN GET FREQ  . Rheumatoid arthritis (HCC)   . Vaginal Pap smear, abnormal     Family History  Problem Relation Age of Onset  . Mitral valve prolapse Mother        DIED @ 56 YOA OF CONDITION  . Healthy Father   . Mitral valve prolapse Paternal Aunt   . Heart attack Paternal Grandfather        DECEASED  . Dementia Maternal Grandmother   . Healthy Son   . Autism Daughter   . Healthy Daughter   . Healthy Daughter   . Healthy Daughter    Past Surgical History:  Procedure Laterality Date  . CESAREAN SECTION N/A 09/09/2016   Procedure: CESAREAN SECTION;  Surgeon: 09/11/2016, MD;  Location: [facility];  Service: Obstetrics;  Laterality: N/A;  . NASAL SINUS SURGERY  2016   Social History   Social History Narrative  . Not on file   There is no immunization history for the selected administration types on file for this patient.   Objective: Vital Signs: There were no vitals taken for this visit.   Physical Exam   Musculoskeletal Exam: ***  CDAI Exam: CDAI Score: -- Patient Global: --; Provider Global: -- Swollen: --; Tender: -- Joint Exam 11/20/2020   No joint exam has been documented for this visit   There is currently no information documented on the homunculus. Go to the Rheumatology activity and complete the homunculus  joint exam.  Investigation: No additional findings.  Imaging: No results found.  Recent Labs: Lab Results  Component Value Date   WBC 4.8 10/20/2020   HGB 13.8 10/20/2020   PLT 245 10/20/2020   NA 142 10/20/2020   K 4.1 10/20/2020   CL 103 10/20/2020   CO2 33 (H) 10/20/2020   GLUCOSE 58 (L) 10/20/2020   BUN 13 10/20/2020   CREATININE 0.70 10/20/2020   BILITOT 0.2 10/20/2020   AST 16 10/20/2020   ALT 21 10/20/2020   PROT 6.6 10/20/2020   CALCIUM 9.6 10/20/2020   GFRAA 132 10/20/2020    QFTBGOLDPLUS NEGATIVE 10/20/2020    Speciality Comments: No specialty comments available.  Procedures:  No procedures performed Allergies: Patient has no known allergies.   Assessment / Plan:     Visit Diagnoses: No diagnosis found.  Orders: No orders of the defined types were placed in this encounter.  No orders of the defined types were placed in this encounter.   Face-to-face time spent with patient was *** minutes. Greater than 50% of time was spent in counseling and coordination of care.  Follow-Up Instructions: No follow-ups on file.   Ellen Henri, CMA  Note - This record has been created using Animal nutritionist.  Chart creation errors have been sought, but may not always  have been located. Such creation errors do not reflect on  the standard of medical care.

## 2020-11-19 ENCOUNTER — Other Ambulatory Visit (HOSPITAL_COMMUNITY): Payer: Self-pay

## 2020-11-20 ENCOUNTER — Ambulatory Visit: Payer: 59 | Admitting: Rheumatology

## 2020-11-20 DIAGNOSIS — M542 Cervicalgia: Secondary | ICD-10-CM

## 2020-11-20 DIAGNOSIS — O09299 Supervision of pregnancy with other poor reproductive or obstetric history, unspecified trimester: Secondary | ICD-10-CM

## 2020-11-20 DIAGNOSIS — L509 Urticaria, unspecified: Secondary | ICD-10-CM

## 2020-11-20 DIAGNOSIS — Z8774 Personal history of (corrected) congenital malformations of heart and circulatory system: Secondary | ICD-10-CM

## 2020-11-20 DIAGNOSIS — Z8751 Personal history of pre-term labor: Secondary | ICD-10-CM

## 2020-11-20 DIAGNOSIS — M0579 Rheumatoid arthritis with rheumatoid factor of multiple sites without organ or systems involvement: Secondary | ICD-10-CM

## 2020-11-20 DIAGNOSIS — Z8279 Family history of other congenital malformations, deformations and chromosomal abnormalities: Secondary | ICD-10-CM

## 2020-11-20 DIAGNOSIS — Z79899 Other long term (current) drug therapy: Secondary | ICD-10-CM

## 2020-11-22 DIAGNOSIS — Z419 Encounter for procedure for purposes other than remedying health state, unspecified: Secondary | ICD-10-CM | POA: Diagnosis not present

## 2020-11-23 ENCOUNTER — Other Ambulatory Visit (HOSPITAL_COMMUNITY): Payer: Self-pay

## 2020-11-24 DIAGNOSIS — B373 Candidiasis of vulva and vagina: Secondary | ICD-10-CM | POA: Diagnosis not present

## 2020-11-24 DIAGNOSIS — J988 Other specified respiratory disorders: Secondary | ICD-10-CM | POA: Diagnosis not present

## 2020-11-24 DIAGNOSIS — F419 Anxiety disorder, unspecified: Secondary | ICD-10-CM | POA: Diagnosis not present

## 2020-11-24 DIAGNOSIS — J0100 Acute maxillary sinusitis, unspecified: Secondary | ICD-10-CM | POA: Diagnosis not present

## 2020-11-25 ENCOUNTER — Other Ambulatory Visit (HOSPITAL_COMMUNITY): Payer: Self-pay

## 2020-12-02 ENCOUNTER — Other Ambulatory Visit (HOSPITAL_COMMUNITY): Payer: Self-pay

## 2020-12-04 NOTE — Progress Notes (Signed)
Office Visit Note  Patient: Robin [name]             Date of Birth: June 27, 1986           MRN: [id number]             PCP: Patient, No Pcp Per (Inactive) Referring: No ref. provider found Visit Date: 12/17/2020 Occupation: @GUAROCC @  Subjective:  Left ankle joint pain   History of Present Illness: Robin [name] is a 35 y.o. female with history of seropositive rheumatoid arthritis.  Patient is currently on Humira 40 mg subcutaneous injections every 14 days and Arava 20 mg 1 tablet by mouth daily.  She was started on Humira in February 2022 has had several interruptions over the past several months due to issues with insurance as well as upper respiratory infection x2.  She has had 1 dose of Humira since restarting.  She is currently having pain and inflammation in the right second MCP joint and in the left ankle joint.  She states that her left ankle remains swollen.  She has been icing and elevating her ankle on a daily basis for the past 3 weeks.  She would like a cortisone injection today. She states that she has started a new exercise/dietary plan (E2M), which she feels has started to greatly improve her overall health.  She has been performing about 1 hour of cardio per day and practices yoga/pilates.  She has also eliminated diary, gluten, and some other pro-inflammatory foods from her diet.    Activities of Daily Living:  Patient reports morning stiffness for 2 hours.   Patient Denies nocturnal pain.  Difficulty dressing/grooming: Denies Difficulty climbing stairs: Denies Difficulty getting out of chair: Denies Difficulty using hands for taps, buttons, cutlery, and/or writing: Denies  Review of Systems  Constitutional: Positive for fatigue.  HENT: Negative for mouth sores, mouth dryness and nose dryness.   Eyes: Negative for pain, itching, visual disturbance and dryness.  Respiratory: Negative for cough, hemoptysis, shortness of breath and difficulty breathing.    Cardiovascular: Negative for chest pain, palpitations and swelling in legs/feet.  Gastrointestinal: Negative for abdominal pain, blood in stool, constipation and diarrhea.  Endocrine: Negative for increased urination.  Genitourinary: Negative for painful urination.  Musculoskeletal: Positive for arthralgias, joint pain, joint swelling, myalgias, morning stiffness and myalgias. Negative for muscle weakness and muscle tenderness.  Skin: Negative for color change, rash and redness.  Allergic/Immunologic: Positive for susceptible to infections.  Neurological: Negative for dizziness, numbness, headaches, memory loss and weakness.  Hematological: Negative for swollen glands.  Psychiatric/Behavioral: Negative for confusion and sleep disturbance.    PMFS History:  Patient Active Problem List   Diagnosis Date Noted  . 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 Anxiety 09/21/2020  . Rheumatoid arthritis involving multiple sites with positive rheumatoid factor (HCC) 01/14/2019  . Normal labor 07/09/2018  . Pregnancy with history of neonatal death 04/27/2018  . Depression affecting pregnancy 04/02/2018  . Irregular periods 01/26/2018  . Hereditary disease in family possibly affecting fetus, affecting management of mother, antepartum condition or complication, not applicable or unspecified fetus   . History of preterm delivery 11/17/2017  . Previous cesarean section 11/17/2017  . Family history of congenital anomaly 11/17/2017  . Family history of congenital heart disease 07/27/2015  . Family history of mitral valve prolapse 07/27/2015  . Family hx of bicuspid aortic valve--FOB 04/03/2012    Past Medical History:  Diagnosis Date  .  Abnormal Pap smear 2008   HAD HPV;HAD GENITAL WART REMOVED;LAST PAP 07/2010 WAS NORMAL  . Anemia    FeSO4 SUPP TAKEN IN PAST  . Asthma    AS A CHILD;GREW OUT OF @ 8 YOA  . Depression    after the death of her son, was on meds  then  . History of smoking 12/26/2011   Reports quit '12  . Infection    UTI;CAN GET FREQ  . Rheumatoid arthritis (HCC)   . Vaginal Pap smear, abnormal     Family History  Problem Relation Age of Onset  . Mitral valve prolapse Mother        DIED @ 44 YOA OF CONDITION  . Healthy Father   . Mitral valve prolapse Paternal Aunt   . Heart attack Paternal Grandfather        DECEASED  . Dementia Maternal Grandmother   . Healthy Son   . Autism Daughter   . Healthy Daughter   . Healthy Daughter   . Healthy Daughter    Past Surgical History:  Procedure Laterality Date  . CESAREAN SECTION N/A 09/09/2016   Procedure: CESAREAN SECTION;  Surgeon: Tereso Newcomer, MD;  Location: [facility];  Service: Obstetrics;  Laterality: N/A;  . NASAL SINUS SURGERY  2016   Social History   Social History Narrative  . Not on file   There is no immunization history for the selected administration types on file for this patient.   Objective: Vital Signs: BP 101/71 (BP Location: Left Arm, Patient Position: Sitting, Cuff Size: Normal)   Pulse 98   Ht 5' 5.5" (1.664 m)   Wt 110 lb 3.2 oz (50 kg)   BMI 18.06 kg/m    Physical Exam Vitals and nursing note reviewed.  Constitutional:      Appearance: She is well-developed.  HENT:     Head: Normocephalic and atraumatic.  Eyes:     Conjunctiva/sclera: Conjunctivae normal.  Pulmonary:     Effort: Pulmonary effort is normal.  Abdominal:     Palpations: Abdomen is soft.  Musculoskeletal:     Cervical back: Normal range of motion.  Skin:    General: Skin is warm and dry.     Capillary Refill: Capillary refill takes less than 2 seconds.  Neurological:     Mental Status: She is alert and oriented to person, place, and time.  Psychiatric:        Behavior: Behavior normal.      Musculoskeletal Exam: C-spine, thoracic spine, and lumbar spine good ROM.  Shoulder joints, elbow joints, wrist joints, MCPs, PIPs, and DIPs good ROM with no  synovitis.  Tenderness and synovial thickening over the right 2nd MCP joint.  Mild DIP thickening.  Hip joints and knee joints good ROM with no discomfort.  No warmth or effusion of knee joints.  Tenderness and inflammation on the lateral aspect of the left ankle joint.  No tenderness over MTP joints.   CDAI Exam: CDAI Score: 1.4  Patient Global: 2 mm; Provider Global: 2 mm Swollen: 1 ; Tender: 2  Joint Exam 12/17/2020      Right  Left  MCP 2   Tender     Ankle     Swollen Tender     Investigation: No additional findings.  Imaging: No results found.  Recent Labs: Lab Results  Component Value Date   WBC 4.8 10/20/2020   HGB 13.8 10/20/2020   PLT 245 10/20/2020   NA 142 10/20/2020  K 4.1 10/20/2020   CL 103 10/20/2020   CO2 33 (H) 10/20/2020   GLUCOSE 58 (L) 10/20/2020   BUN 13 10/20/2020   CREATININE 0.70 10/20/2020   BILITOT 0.2 10/20/2020   AST 16 10/20/2020   ALT 21 10/20/2020   PROT 6.6 10/20/2020   CALCIUM 9.6 10/20/2020   GFRAA 132 10/20/2020   QFTBGOLDPLUS NEGATIVE 10/20/2020    Speciality Comments: No specialty comments available.  Procedures:  No procedures performed Allergies: Patient has no known allergies.    Assessment / Plan:     Visit Diagnoses: Rheumatoid arthritis with rheumatoid factor of multiple sites without organ or systems involvement [facility]): She presents today with tenderness and inflammation in the left ankle joint, which has progressively been worsening over the past 3 weeks.  She was initially started on Humira on 09/14/20, but she has had several interruptions in therapy due to issues with insurance coverage and being diagnosed with URI x2.  She has resumed Humira as prescribed and remained on Arava 20 mg 1 tablet by mouth daily the entire time.  She has recently started a new program E2M with the goal of living a healthier lifestyle.  She has increased her exercise regimen (1 hour of cardio daily/yoga/pilates) and has changed her diet  (eliminated gluten, diary, and some of there pro-inflammatory foods).  She has found the program to be beneficial and plans to continue the maintenance program in the future.   She requested a left ankle cortisone injection today, which has been helpful in the past (last injection 12/19/19).  Procedure note completed above.  Aftercare was discussed. She will remain on the current treatment regimen. She was advised to notify us if she develops increased joint pain or joint swelling. She will follow up in 3 months.   High risk medication use - First dose of Humira initiated on 09/14/20. She will continue on Humira 40 mg sq injections every 14 days and Arava 20 mg 1 tablet by mouth daily.  Discontinued Enbrel-injection site reactions. CBC, CMP, TB Gold were updated on 10/20/2020.  Her next CBC and CMP will be due in June and every 3 months to monitor for drug toxicity.  Standing orders for CBC and CMP are in place. Advised to hold Humira and arava if she develops signs or symptoms of an infection and to resume once the infection has completely cleared.    Chronic pain of left ankle- She presents today with acute on chronic pain in the left ankle joint.  She has not had any recent injuries but she has increased her exercise regimen since starting E2M.  She has been performing about 1 hour of cardio per day.  She has been icing and elevating her ankle after exercise and has tried taking ibuprofen for pain relief. On examination today she has tenderness and inflammation on the lateral aspect of the left ankle.  She requested a left ankle joint cortisone injection today.  Last cortisone injection was performed on 12/18/20. She tolerated the procedure well.  Aftercare was discussed.  Procedure note completed above.  She was advised to notify us if her discomfort persists or worsens. Plan: US Guided Needle Placement  Other medical conditions are listed as follows:   Family hx of bicuspid aortic valve--FOB  Family  history of congenital heart disease  History of preterm delivery    Orders: Orders Placed This Encounter  Procedures  . US Guided Needle Placement   No orders of the defined types were placed in  this encounter.    Follow-Up Instructions: Return in about 3 months (around 03/19/2021) for Rheumatoid arthritis.   Gearldine Bienenstock, PA-C  Note - This record has been created using Dragon software.  Chart creation errors have been sought, but may not always  have been located. Such creation errors do not reflect on  the standard of medical care.

## 2020-12-07 ENCOUNTER — Other Ambulatory Visit: Payer: Self-pay | Admitting: Rheumatology

## 2020-12-07 NOTE — Telephone Encounter (Signed)
Next Visit: 12/17/2020  Last Visit: 08/17/2020  Last Fill: 08/17/2020  DX: Rheumatoid arthritis with rheumatoid factor of multiple sites without organ or systems involvement   Current Dose per office note 08/17/2020, Arava 20 mg p.o. daily  Labs: 10/20/2020, Glucose is low-58. Renal and liver function WNL. CBC WNL.  Okay to refill Arava?

## 2020-12-14 ENCOUNTER — Other Ambulatory Visit: Payer: Self-pay

## 2020-12-14 ENCOUNTER — Ambulatory Visit (INDEPENDENT_AMBULATORY_CARE_PROVIDER_SITE_OTHER): Payer: Medicaid Other | Admitting: Obstetrics & Gynecology

## 2020-12-14 ENCOUNTER — Encounter: Payer: Self-pay | Admitting: Obstetrics & Gynecology

## 2020-12-14 VITALS — BP 101/71 | HR 94 | Resp 16 | Ht 65.0 in | Wt 110.0 lb

## 2020-12-14 DIAGNOSIS — Z01411 Encounter for gynecological examination (general) (routine) with abnormal findings: Secondary | ICD-10-CM

## 2020-12-14 DIAGNOSIS — R439 Unspecified disturbances of smell and taste: Secondary | ICD-10-CM | POA: Diagnosis not present

## 2020-12-14 DIAGNOSIS — U099 Post covid-19 condition, unspecified: Secondary | ICD-10-CM

## 2020-12-14 DIAGNOSIS — N9489 Other specified conditions associated with female genital organs and menstrual cycle: Secondary | ICD-10-CM | POA: Diagnosis not present

## 2020-12-14 DIAGNOSIS — N920 Excessive and frequent menstruation with regular cycle: Secondary | ICD-10-CM | POA: Diagnosis not present

## 2020-12-14 DIAGNOSIS — R438 Other disturbances of smell and taste: Secondary | ICD-10-CM

## 2020-12-14 MED ORDER — NORETHIN ACE-ETH ESTRAD-FE 1-20 MG-MCG(24) PO TABS: 1.0000 | ORAL_TABLET | Freq: Every day | ORAL | 3 refills | Status: DC

## 2020-12-14 NOTE — Progress Notes (Signed)
Subjective:     Robin [name] is a 35 y.o. female here for a routine exam.  Patient has been having very heavy menstrual cycles lately.  They are regular but now heavy with clots.  Patient has been hesitant to use any hormonal products.  She is done with childbearing.  Her husband has had a vasectomy.  She had a CT last year that showed possible pelvic congestion syndrome.  This could explain some of her dysmenorrhea and pelvic pain.  Patient is interested in ablation to help with menstrual periods.  She is getting ready to go to [place] and mostly does not want to have a heavy cycle while she is out of the country.  Patient has rheumatoid arthritis.  She is going to ask her rheumatologist if she there is any contraindication to estrogen-containing birth control to help her with her menorrhagia as we finish the work-up.  Patient would be interested in combination OCPs to prohibit her from having another heavy painful menstrual cycle      Gynecologic History Patient's last menstrual period was 11/13/2020. Contraception: vasectomy Last Pap: 2020. Results were: normal Last mammogram: n/a  Age 35.   Obstetric History OB History  Gravida Para Term Preterm AB Living  6 5 4 1 1 4   SAB IAB Ectopic Multiple Live Births  1     0 5    # Outcome Date GA Lbr Len/2nd Weight Sex Delivery Anes PTL Lv  6 Term 07/09/18 [redacted]w[redacted]d 08:42 / 00:01 7 lb 9 oz (3.43 kg) F VBAC None  LIV  5 Preterm 09/09/16 [redacted]w[redacted]d   M CS-LTranv   ND     Birth Comments: abruption  4 Term 12/08/15 [redacted]w[redacted]d   F Vag-Spont   LIV     Birth Comments: homebirth  3 SAB 01/31/14 [redacted]w[redacted]d    SAB   FD  2 Term 08/09/12 [redacted]w[redacted]d  7 lb 6.5 oz (3.36 kg) F Vag-Spont   LIV     Birth Comments: BORN EN ROUTE TO [facility]  1 Term 07/16/10 [redacted]w[redacted]d 12:00 7 lb 7 oz (3.374 kg) M Vag-Spont EPI Y LIV     Birth Comments: No complications    EXAM: CT ABDOMEN AND PELVIS WITH CONTRAST  TECHNIQUE: Multidetector CT imaging of the abdomen and pelvis was performed using the  standard protocol following bolus administration of intravenous contrast. Sagittal and coronal MPR images reconstructed from axial data set.  CONTRAST:  M OMNIPAQUE IOHEXOL 300 MG/ML SOLN IV. Dilute oral contrast.  COMPARISON:  None  FINDINGS: Lower chest: Lung bases clear  Hepatobiliary: 6 x 5 mm nonspecific low-attenuation focus superiorly LEFT lobe liver image 14. Additional tiny nonspecific hepatic lesion 6 x 5 mm posterior aspect of liver superiorly image 18. In a low risk patient (no history of malignancy, hepatic dysfunction or hepatic risk factors), these are likely benign. No additional hepatic masses. Gallbladder unremarkable.  Pancreas: Normal appearance  Spleen: Normal appearance  Adrenals/Urinary Tract: Adrenal glands, kidneys, ureters, and bladder normal appearance  Stomach/Bowel: Normal appendix. Stomach and bowel loops unremarkable.  Vascular/Lymphatic: Aorta normal caliber. Vascular structures patent. Dilated LEFT renal vein with numerous prominent vessels in the adnexal regions bilaterally LEFT greater than RIGHT, can be seen with pelvic congestion syndrome. No adenopathy.  Reproductive: Unremarkable uterus and ovaries  Other: Small amount of free fluid in pelvis. No free air. No hernia.  Musculoskeletal: Unremarkable  IMPRESSION: Small amount of nonspecific free fluid in pelvis.  Dilated LEFT renal vein with numerous prominent vessels in  the adnexal regions bilaterally LEFT greater than RIGHT, can be seen with pelvic congestion syndrome.  No other intra-abdominal or intrapelvic abnormalities.   The following portions of the patient's history were reviewed and updated as appropriate: allergies, current medications, past family history, past medical history, past social history, past surgical history and problem list.  Review of Systems Pertinent items noted in HPI and remainder of comprehensive ROS otherwise negative.     Objective:      Vitals:   12/14/20 0915  BP: 101/71  Pulse: 94  Resp: 16  Weight: 110 lb (49.9 kg)  Height: 5\' 5"  (1.651 m)   Vitals:  WNL General appearance: alert, cooperative and no distress  HEENT: Normocephalic, without obvious abnormality, atraumatic Eyes: negative Throat: lips, mucosa, and tongue normal; teeth and gums normal  Respiratory: Clear to auscultation bilaterally  CV: Regular rate and rhythm  Breasts:  Normal appearance, no masses or tenderness, no nipple retraction or dimpling  GI: Soft, non-tender; bowel sounds normal; no masses,  no organomegaly  GU: External Genitalia:  Tanner V, no lesion Urethra:  No prolapse   Vagina: Pink, normal rugae, no blood or discharge  Cervix: No CMT, no lesion  Uterus:  Normal size and contour, non tender  Adnexa: Normal, no masses, non tender  Musculoskeletal: No edema, redness or tenderness in the calves or thighs  Skin: No lesions or rash  Lymphatic: Axillary adenopathy: none     Psychiatric: Normal mood and behavior        Assessment:    Healthy female exam.   Pelvic pain / pelvic congestion syndrome menorrhagia and dysmenorrhea   Plan:   1..  Compl all ete pelvic ultrasound with transvaginal. 2.  Patient to ask rheumatologist about oral contraceptives.  If she is able to take estrogen we will put her on continuous OCPs.  This will help mood as well as menorrhagia and dysmenorrhea. 3.  F/u after .

## 2020-12-17 ENCOUNTER — Encounter: Payer: Self-pay | Admitting: Physician Assistant

## 2020-12-17 ENCOUNTER — Other Ambulatory Visit: Payer: Self-pay

## 2020-12-17 ENCOUNTER — Other Ambulatory Visit: Payer: 59

## 2020-12-17 ENCOUNTER — Ambulatory Visit: Payer: Self-pay

## 2020-12-17 ENCOUNTER — Ambulatory Visit: Payer: Managed Care, Other (non HMO) | Admitting: Physician Assistant

## 2020-12-17 VITALS — BP 101/71 | HR 98 | Ht 65.5 in | Wt 110.2 lb

## 2020-12-17 DIAGNOSIS — G8929 Other chronic pain: Secondary | ICD-10-CM

## 2020-12-17 DIAGNOSIS — M0579 Rheumatoid arthritis with rheumatoid factor of multiple sites without organ or systems involvement: Secondary | ICD-10-CM

## 2020-12-17 DIAGNOSIS — M25572 Pain in left ankle and joints of left foot: Secondary | ICD-10-CM

## 2020-12-17 DIAGNOSIS — Z8774 Personal history of (corrected) congenital malformations of heart and circulatory system: Secondary | ICD-10-CM | POA: Diagnosis not present

## 2020-12-17 DIAGNOSIS — Z8279 Family history of other congenital malformations, deformations and chromosomal abnormalities: Secondary | ICD-10-CM

## 2020-12-17 DIAGNOSIS — Z79899 Other long term (current) drug therapy: Secondary | ICD-10-CM

## 2020-12-17 DIAGNOSIS — Z8751 Personal history of pre-term labor: Secondary | ICD-10-CM

## 2020-12-17 NOTE — Patient Instructions (Signed)
Standing Labs We placed an order today for your standing lab work.   Please have your standing labs drawn in June and every 3 months   If possible, please have your labs drawn 2 weeks prior to your appointment so that the provider can discuss your results at your appointment.  Please note that you may see your imaging and lab results in MyChart before we have reviewed them. We may be awaiting multiple results to interpret others before contacting you. Please allow our office up to 72 hours to thoroughly review all of the results before contacting the office for clarification of your results.  We have open lab daily: Monday through Thursday from 1:30-4:30 PM and Friday from 1:30-4:00 PM at the office of Dr. Shaili Deveshwar, [facility].   Please be advised, all patients with office appointments requiring lab work will take precedent over walk-in lab work.  If possible, please come for your lab work on Monday and Friday afternoons, as you may experience shorter wait times. The office is located at [address], [address], [place], [place] [address] No appointment is necessary.   Labs are drawn by [facility]. Please bring your co-pay at the time of your lab draw.  You may receive a bill from [facility] for your lab work.  If you wish to have your labs drawn at another location, please call the office 24 hours in advance to send orders.  If you have any questions regarding directions or hours of operation,  please call [phone].   As a reminder, please drink plenty of water prior to coming for your lab work. Thanks!  

## 2020-12-23 ENCOUNTER — Telehealth: Payer: Self-pay | Admitting: Pharmacist

## 2020-12-23 DIAGNOSIS — Z419 Encounter for procedure for purposes other than remedying health state, unspecified: Secondary | ICD-10-CM | POA: Diagnosis not present

## 2020-12-23 NOTE — Telephone Encounter (Signed)
Received call from patient asking if there was an specific interaction or concern between estrogen-containing continuous OCPs with Humira and leflunomide use for her rheumatoid arthritis.  There is no significant drug interaction and contraindication preventing use of OCPs for menorrhagia.  Called patient to advise. She will communicate this update with Dr. Fredia Beets patient to notify  Chesley Mires, PharmD, MPH Clinical Pharmacist (Rheumatology and Pulmonology)

## 2020-12-28 ENCOUNTER — Other Ambulatory Visit: Payer: 59

## 2021-01-01 ENCOUNTER — Ambulatory Visit (INDEPENDENT_AMBULATORY_CARE_PROVIDER_SITE_OTHER): Payer: Managed Care, Other (non HMO)

## 2021-01-01 ENCOUNTER — Other Ambulatory Visit: Payer: Self-pay

## 2021-01-01 DIAGNOSIS — N920 Excessive and frequent menstruation with regular cycle: Secondary | ICD-10-CM

## 2021-01-04 ENCOUNTER — Other Ambulatory Visit (HOSPITAL_COMMUNITY): Payer: Self-pay

## 2021-01-05 ENCOUNTER — Other Ambulatory Visit (HOSPITAL_COMMUNITY): Payer: Self-pay

## 2021-01-22 DIAGNOSIS — Z419 Encounter for procedure for purposes other than remedying health state, unspecified: Secondary | ICD-10-CM | POA: Diagnosis not present

## 2021-01-26 ENCOUNTER — Other Ambulatory Visit: Payer: Self-pay | Admitting: Rheumatology

## 2021-01-26 ENCOUNTER — Other Ambulatory Visit (HOSPITAL_COMMUNITY): Payer: Self-pay

## 2021-01-26 DIAGNOSIS — M0579 Rheumatoid arthritis with rheumatoid factor of multiple sites without organ or systems involvement: Secondary | ICD-10-CM

## 2021-01-26 MED ORDER — HUMIRA (2 PEN) 40 MG/0.4ML ~~LOC~~ AJKT
40.0000 mg | AUTO-INJECTOR | SUBCUTANEOUS | 0 refills | Status: DC
  Filled 2021-01-26: qty 2, 28d supply, fill #0

## 2021-01-26 NOTE — Telephone Encounter (Signed)
Next Visit: 03/23/2021  Last Visit: 12/17/2020  Last Fill: 10/28/2020  [id number] arthritis with rheumatoid factor of multiple sites without organ or systems involvement  Current Dose per office note 12/17/2020: Humira 40 mg sq injections every 14 days  Labs: 10/20/2020 Glucose is low-58. Renal and liver function WNL. CBC WNL.   TB Gold: 10/20/2020 Neg   Patient advised she is due to update labs. Patient states she will try to update this week or early next week.   Okay to refill Humira?

## 2021-01-27 ENCOUNTER — Other Ambulatory Visit: Payer: Self-pay

## 2021-01-27 MED ORDER — PREDNISONE 5 MG PO TABS: ORAL_TABLET | ORAL | 0 refills | Status: DC

## 2021-01-27 NOTE — Telephone Encounter (Signed)
Patient left a voicemail requesting a return call to let her know if Dr. Corliss Skains will call in Prednisone taper for a current severe flair.

## 2021-01-27 NOTE — Telephone Encounter (Signed)
Ok to send in a prednisone taper starting at 20 mg tapering by 5 mg every 4 days.   If she continues to have recurrent flares we will need to discuss other treatment options at her upcoming follow up appointment.

## 2021-01-27 NOTE — Telephone Encounter (Signed)
Patient states she has been in a bad flare since Saturday 01/23/2021. Patient states is started in her left hand and then went to her right hand. Patient states she took Ibuprofen to see if it would help. Patient states it interrupted her sleep. Patient states her right hand is swelling and unable to hold a spoon. Patient states she is taking her Humira as prescribed as well as her [place]. Patient states her last Humira injection was last night. Patient states she received an injection in her ankle in Dec 17, 2020. Patient states that injection with swelling but not the pain. She states she has pain when she steps or walks. Patient is requesting a prescription for Prednisone. Please advise.

## 2021-01-27 NOTE — Telephone Encounter (Signed)
Patient advised ok to send in a prednisone taper starting at 20 mg tapering by 5 mg every 4 days.   Patient advised if she continues to have recurrent flares we will need to discuss other treatment options at her upcoming follow up appointment.

## 2021-02-01 ENCOUNTER — Other Ambulatory Visit (HOSPITAL_COMMUNITY): Payer: Self-pay

## 2021-02-22 DIAGNOSIS — Z419 Encounter for procedure for purposes other than remedying health state, unspecified: Secondary | ICD-10-CM | POA: Diagnosis not present

## 2021-02-23 ENCOUNTER — Other Ambulatory Visit (HOSPITAL_COMMUNITY): Payer: Self-pay

## 2021-02-23 ENCOUNTER — Encounter: Payer: Self-pay | Admitting: Rheumatology

## 2021-02-23 NOTE — Telephone Encounter (Signed)
If she continues to have worsening injection site reactions she will need a follow up visit to discuss other treatment options.

## 2021-02-23 NOTE — Telephone Encounter (Signed)
I called patient, patient will discuss medications with Dr. Corliss Skains at 03/23/2021 visit, patient will call the day before she goes to [facility] to have labs drawn.

## 2021-03-09 NOTE — Progress Notes (Signed)
Office Visit Note  Patient: Robin [name]             Date of Birth: [birth year]           MRN: [id number]             PCP: Patient, No Pcp Per (Inactive) Referring: No ref. provider found Visit Date: 03/23/2021 Occupation: @GUAROCC @  Subjective:  Medication management, recent flare   History of Present Illness: TASHEBA HENSON is a 35 y.o. female with a history of seropositive rheumatoid arthritis.  She has been on combination of leflunomide and Humira since April 2022.  She states she still gets severe flare in her right hand about once in 2 months.  She states her last flare was about 2 weeks ago.  After she gave her self to Humira injection yesterday swelling resolved.  She has been having some injection site reactions.  She brought pictures on her iPhone today.  Activities of Daily Living:  Patient reports morning stiffness for 0 minutes.   Patient Denies nocturnal pain.  Difficulty dressing/grooming: Denies Difficulty climbing stairs: Denies Difficulty getting out of chair: Denies Difficulty using hands for taps, buttons, cutlery, and/or writing: Denies  Review of Systems  Constitutional:  Positive for fatigue.  HENT:  Negative for mouth sores, mouth dryness and nose dryness.   Eyes:  Negative for pain, itching and dryness.  Respiratory:  Negative for shortness of breath and difficulty breathing.   Cardiovascular:  Negative for chest pain and palpitations.  Gastrointestinal:  Negative for blood in stool, constipation and diarrhea.  Endocrine: Negative for increased urination.  Genitourinary:  Negative for difficulty urinating.  Musculoskeletal:  Positive for joint pain, joint pain and joint swelling. Negative for myalgias, morning stiffness, muscle tenderness and myalgias.  Skin:  Negative for color change, rash and redness.  Allergic/Immunologic: Positive for susceptible to infections.  Neurological:  Negative for dizziness, numbness, headaches, memory loss and weakness.   Hematological:  Positive for bruising/bleeding tendency.  Psychiatric/Behavioral:  Negative for confusion.    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/2020   Anxiety 09/21/2020   Rheumatoid arthritis involving multiple sites with positive rheumatoid factor (HCC) 01/14/2019   Normal labor 07/09/2018   Pregnancy with history of neonatal death 01-May-2018   Depression affecting pregnancy 04/02/2018   Irregular periods 01/26/2018   Hereditary disease in family possibly affecting fetus, affecting management of mother, antepartum condition or complication, not applicable or unspecified fetus    History of preterm delivery 11/17/2017   Previous cesarean section 11/17/2017   Family history of congenital anomaly 11/17/2017   Family history of congenital heart disease 07/27/2015   Family history of mitral valve prolapse 07/27/2015   Family hx of bicuspid aortic valve--FOB 04/03/2012    Past Medical History:  Diagnosis Date   Abnormal Pap smear 2008   HAD HPV;HAD GENITAL WART REMOVED;LAST PAP 07/2010 WAS NORMAL   Anemia    FeSO4 SUPP TAKEN IN PAST   Asthma    AS A CHILD;GREW OUT OF @ 8 YOA   Depression    after the death of her son, was on meds then   History of smoking 12/26/2011   Reports quit '12   Infection    UTI;CAN GET FREQ   Rheumatoid arthritis (HCC)    Vaginal Pap smear, abnormal     Family History  Problem Relation Age of Onset  Mitral valve prolapse Mother        DIED @ 95 YOA OF CONDITION   Healthy Father    Mitral valve prolapse Paternal Aunt    Heart attack Paternal Grandfather        DECEASED   Dementia Maternal Grandmother    Healthy Son    Autism Daughter    Healthy Daughter    Healthy Daughter    Healthy Daughter    Past Surgical History:  Procedure Laterality Date   CESAREAN SECTION N/A 09/09/2016   Procedure: CESAREAN SECTION;  Surgeon: Tereso Newcomer,  MD;  Location: [facility];  Service: Obstetrics;  Laterality: N/A;   NASAL SINUS SURGERY  2016   Social History   Social History Narrative   Not on file   There is no immunization history for the selected administration types on file for this patient.   Objective: Vital Signs: BP 106/70 (BP Location: Right Arm, Patient Position: Sitting, Cuff Size: Normal)   Pulse 97   Ht 5\' 6"  (1.676 m)   Wt 117 lb 3.2 oz (53.2 kg)   BMI 18.92 kg/m    Physical Exam Vitals and nursing note reviewed.  Constitutional:      Appearance: She is well-developed.  HENT:     Head: Normocephalic and atraumatic.  Eyes:     Conjunctiva/sclera: Conjunctivae normal.  Cardiovascular:     Rate and Rhythm: Normal rate and regular rhythm.     Heart sounds: Normal heart sounds.  Pulmonary:     Effort: Pulmonary effort is normal.     Breath sounds: Normal breath sounds.  Abdominal:     General: Bowel sounds are normal.     Palpations: Abdomen is soft.  Musculoskeletal:     Cervical back: Normal range of motion.  Lymphadenopathy:     Cervical: No cervical adenopathy.  Skin:    General: Skin is warm and dry.     Capillary Refill: Capillary refill takes less than 2 seconds.  Neurological:     Mental Status: She is alert and oriented to person, place, and time.  Psychiatric:        Behavior: Behavior normal.     Musculoskeletal Exam: C-spine was in good range of motion.  Shoulder joints, elbow joints, wrist joints, MCPs PIPs and DIPs with good range of motion.  She has synovitis over right second MCP joint.  Hip joints, knee joints, ankles, MTPs and PIPs with good range of motion with no synovitis.  CDAI Exam: CDAI Score: 2.4  Patient Global: 2 mm; Provider Global: 2 mm Swollen: 1 ; Tender: 1  Joint Exam 03/23/2021      Right  Left  MCP 2  Swollen Tender        Investigation: No additional findings.  Imaging: No results found.  Recent Labs: Lab Results  Component Value Date   WBC  4.8 10/20/2020   HGB 13.8 10/20/2020   PLT 245 10/20/2020   NA 142 10/20/2020   K 4.1 10/20/2020   CL 103 10/20/2020   CO2 33 (H) 10/20/2020   GLUCOSE 58 (L) 10/20/2020   BUN 13 10/20/2020   CREATININE 0.70 10/20/2020   BILITOT 0.2 10/20/2020   AST 16 10/20/2020   ALT 21 10/20/2020   PROT 6.6 10/20/2020   CALCIUM 9.6 10/20/2020   GFRAA 132 10/20/2020   QFTBGOLDPLUS NEGATIVE 10/20/2020    Speciality Comments: humira 04/22,   PLQ, MTx, Enbrel  Procedures:  No procedures performed Allergies: Patient has no known  allergies.   Assessment / Plan:     Visit Diagnoses: Rheumatoid arthritis with rheumatoid factor of multiple sites without organ or systems involvement (HCC)-she is currently on Humira and leflunomide combination.  She continues to have frequent flares.  She reports having 1 flare every 1 to 2 months.  All the flares are localized to her right hand.  She brought pictures on her iPhone.  She is also having injection site reaction from Humira which is radiating to her.  She states she has nocturnal pain due to hand pain and swelling.  Today she had some synovitis but overall doing better after the last Humira injection.  She failed Enbrel methotrexate and Plaquenil in the past.  We had detailed discussion regarding different treatment options and their side effects.  After review indications side effects contraindications she was in agreement to proceed with Orencia.  Handout was given and consent was taken.  We will apply for Orencia subcu injections.  Medication counseling:  TB Gold: 10/20/2020 Hepatitis panel: 10/29/2019 HIV: 04/27/2018 SPEP: 10/29/2019 Immunoglobulins: 10/29/2019  Does patient have a diagnosis of COPD? No  Counseled patient that Dub Amis is a selective T-cell costimulation blocker indicated for rheumatoid arthritis Ro secondary to me favor please on this [place] I would need all these dates to put their.  Counseled patient on purpose, proper use, and  adverse effects of Orencia. The most common adverse effects are increased risk of infections, headache, and infusion reactions.  There is the possibility of an increased risk of malignancy but it is not well understood if this increased risk is due to the medication or the disease state.  Reviewed the importance of regular labs while on Orencia therapy.  Counseled patient that Dub Amis should be held prior to scheduled surgery.  Counseled patient to avoid live vaccines while on Orencia.  Advised patient to get annual influenza vaccine and the pneumococcal vaccine as indicated.  Provided patient with medication education material and answered all questions.  Patient consented to [facility].  Will upload consent into patient's chart.  Will submit benefit's investigation for Orencia.   High risk medication use - Humira 40 mg sq injections every 14 days and Arava 20 mg 1 tablet by mouth daily.  Discontinued Enbrel-injection site reactions.  - Plan: CBC with Differential/Platelet, COMPLETE METABOLIC PANEL WITH GFR today and then in 1 month after starting on Orencia.  She has been advised to discontinue Humira.  She will continue leflunomide and Orencia will be added to leflunomide once approved by the insurance.  Instructions regarding immunization was placed in the AVS.  Increased risk of infection with the medication was also discussed.  She was advised to stop medications if she gets an infection and resume after infection resolves.  Chronic pain of left ankle - cortisone injection performed on 12/17/2020.  Family hx of bicuspid aortic valve--FOB.  Increased risk of heart disease with rheumatoid arthritis was also discussed.  Dietary modifications and exercise were discussed and placed in the AVS.  Family history of congenital heart disease  History of preterm delivery  Orders: Orders Placed This Encounter  Procedures   CBC with Differential/Platelet   COMPLETE METABOLIC PANEL WITH GFR    No orders of  the defined types were placed in this encounter.   Follow-Up Instructions: Return in about 2 months (around 05/23/2021) for Rheumatoid arthritis.   Pollyann Savoy, MD  Note - This record has been created using Animal nutritionist.  Chart creation errors have been sought, but may not  always  have been located. Such creation errors do not reflect on  the standard of medical care.

## 2021-03-16 ENCOUNTER — Other Ambulatory Visit: Payer: Self-pay | Admitting: *Deleted

## 2021-03-16 ENCOUNTER — Other Ambulatory Visit: Payer: Self-pay | Admitting: Physician Assistant

## 2021-03-16 ENCOUNTER — Other Ambulatory Visit (HOSPITAL_COMMUNITY): Payer: Self-pay

## 2021-03-16 DIAGNOSIS — M0579 Rheumatoid arthritis with rheumatoid factor of multiple sites without organ or systems involvement: Secondary | ICD-10-CM

## 2021-03-16 DIAGNOSIS — Z111 Encounter for screening for respiratory tuberculosis: Secondary | ICD-10-CM

## 2021-03-16 DIAGNOSIS — Z9225 Personal history of immunosupression therapy: Secondary | ICD-10-CM

## 2021-03-16 MED ORDER — HUMIRA (2 PEN) 40 MG/0.4ML ~~LOC~~ AJKT
40.0000 mg | AUTO-INJECTOR | SUBCUTANEOUS | 0 refills | Status: DC
  Filled 2021-03-16: qty 2, 28d supply, fill #0

## 2021-03-16 NOTE — Telephone Encounter (Signed)
Next Visit: 03/23/2021  Last Visit: 12/17/2020  Last Fill: 01/26/2021 (30 Day supply)  [id number] arthritis with rheumatoid factor of multiple sites without organ or systems involvement  Current Dose per office note 12/17/2020: Humira 40 mg sq injections every 14 days   Labs: 10/20/2020 Glucose is low-58. Renal and liver function WNL. CBC WNL.  TB Gold: 10/20/2020  Neg   Noted appointment for 03/23/2021 to update labs.   Okay to refill Humira?

## 2021-03-17 ENCOUNTER — Other Ambulatory Visit (HOSPITAL_COMMUNITY): Payer: Self-pay

## 2021-03-20 ENCOUNTER — Other Ambulatory Visit: Payer: Self-pay | Admitting: Obstetrics & Gynecology

## 2021-03-23 ENCOUNTER — Other Ambulatory Visit: Payer: Self-pay

## 2021-03-23 ENCOUNTER — Telehealth: Payer: Self-pay | Admitting: Pharmacist

## 2021-03-23 ENCOUNTER — Encounter: Payer: Self-pay | Admitting: Rheumatology

## 2021-03-23 ENCOUNTER — Ambulatory Visit (INDEPENDENT_AMBULATORY_CARE_PROVIDER_SITE_OTHER): Payer: Managed Care, Other (non HMO) | Admitting: Rheumatology

## 2021-03-23 VITALS — BP 106/70 | HR 97 | Ht 66.0 in | Wt 117.2 lb

## 2021-03-23 DIAGNOSIS — M0579 Rheumatoid arthritis with rheumatoid factor of multiple sites without organ or systems involvement: Secondary | ICD-10-CM | POA: Diagnosis not present

## 2021-03-23 DIAGNOSIS — Z79899 Other long term (current) drug therapy: Secondary | ICD-10-CM

## 2021-03-23 DIAGNOSIS — Z8279 Family history of other congenital malformations, deformations and chromosomal abnormalities: Secondary | ICD-10-CM

## 2021-03-23 DIAGNOSIS — G8929 Other chronic pain: Secondary | ICD-10-CM

## 2021-03-23 DIAGNOSIS — Z8751 Personal history of pre-term labor: Secondary | ICD-10-CM

## 2021-03-23 DIAGNOSIS — Z8774 Personal history of (corrected) congenital malformations of heart and circulatory system: Secondary | ICD-10-CM | POA: Diagnosis not present

## 2021-03-23 DIAGNOSIS — M25572 Pain in left ankle and joints of left foot: Secondary | ICD-10-CM | POA: Diagnosis not present

## 2021-03-23 NOTE — Progress Notes (Signed)
Pharmacy Note  Subjective: Patient presents today to [facility] for follow up office visit.   Patient seen by the pharmacist for counseling on subcutaneous Orencia for rheumatoid arthritis.  Prior therapy includes: Humira (inadequate response), Enbrel (injection site reaction). Her last Humira dose was today, 03/23/21  Objective: CBC    Component Value Date/Time   WBC 4.8 10/20/2020 1610   RBC 4.46 10/20/2020 1610   HGB 13.8 10/20/2020 1610   HCT 42.5 10/20/2020 1610   PLT 245 10/20/2020 1610   MCV 95.3 10/20/2020 1610   MCH 30.9 10/20/2020 1610   MCHC 32.5 10/20/2020 1610   RDW 11.8 10/20/2020 1610   LYMPHSABS 1,267 10/20/2020 1610   MONOABS 0.5 07/08/2016 1748   EOSABS 331 10/20/2020 1610   BASOSABS 91 10/20/2020 1610    CMP     Component Value Date/Time   NA 142 10/20/2020 1610   K 4.1 10/20/2020 1610   CL 103 10/20/2020 1610   CO2 33 (H) 10/20/2020 1610   GLUCOSE 58 (L) 10/20/2020 1610   BUN 13 10/20/2020 1610   CREATININE 0.70 10/20/2020 1610   CALCIUM 9.6 10/20/2020 1610   PROT 6.6 10/20/2020 1610   AST 16 10/20/2020 1610   ALT 21 10/20/2020 1610   BILITOT 0.2 10/20/2020 1610   GFRNONAA 114 10/20/2020 1610   GFRAA 132 10/20/2020 1610    Baseline Immunosuppressant Therapy Labs TB GOLD Quantiferon TB Gold Latest Ref Rng & Units 10/20/2020  Quantiferon TB Gold Plus NEGATIVE NEGATIVE   Hepatitis Panel Hepatitis Latest Ref Rng & Units 10/29/2019  Hep B Surface Ag NON-REACTI NON-REACTIVE  Hep B IgM NON-REACTI NON-REACTIVE   HIV Lab Results  Component Value Date   HIV NON-REACTIVE 04/27/2018   HIV NON-REACTIVE 12/01/2017   HIV Non Reactive 09/06/2016   HIV NONREACTIVE 05/04/2016   HIV NONREACTIVE 09/07/2015   Immunoglobulins Immunoglobulin Electrophoresis Latest Ref Rng & Units 10/29/2019  IgA  47 - 310 mg/dL 176  IgG 160 - 7,371 mg/dL 0,626  IgM 50 - 948 mg/dL 98   SPEP Serum Protein Electrophoresis Latest Ref Rng & Units 10/20/2020  Total  Protein 6.1 - 8.1 g/dL 6.6  Albumin 3.8 - 4.8 g/dL -  Alpha-1 0.2 - 0.3 g/dL -  Alpha-2 0.5 - 0.9 g/dL -  Beta Globulin 0.4 - 0.6 g/dL -  Beta 2 0.2 - 0.5 g/dL -  Gamma Globulin 0.8 - 1.7 g/dL -   N4OE No results found for: G6PDH TPMT No results found for: TPMT   Chest x-ray:07/09/20 - no active cardiopulmonary disease  Does patient have a diagnosis of COPD? No  Does patient have history of diverticulitis?  No  Assessment/Plan:  Counseled patient that Dub Amis is a selective T-cell costimulation blocker.  Counseled patient on purpose, proper use, and adverse effects of Orencia. The most common adverse effects are increased risk of infections, headache, and injection site reactions.  There is the possibility of an increased risk of malignancy but it is not well understood if this increased risk is due to the medication or the disease state. Reviewed risk of GI perforation which is higher in patients with diverticulitis and diabetes.  Counseled patient that Dub Amis should be held prior to scheduled surgery.  Counseled patient to avoid live vaccines while on Orencia.  Recommend annual influenza, Pneumovax 23, Prevnar 13, and Shingrix as indicated.  She wil  Reviewed the importance of regular labs while on Orencia therapy. Patient will be due for labs 1 month after starting therapy.  Standing orders placed. Provided patient with medication education material and answered all questions.  Patient consented to [facility].  Will upload consent into patient's chart.  Will apply for Orencia through patient's insurance.  Reviewed storage information for Orencia.  Advised initial injection must be administered in office.    Patient dose will be Orencia 125 mg every 7 days.  Prescription pending lab results and/or insurance approval.  She will be able to start Orencia on or after 04/06/21  She has [employer] as primary and Medicaid as secondary coverage now.  Chesley Mires, PharmD, MPH, BCPS Clinical Pharmacist  (Rheumatology and Pulmonology)

## 2021-03-23 NOTE — Telephone Encounter (Signed)
Please start Orencia BIV.  Dose: 125mg  every 7 days  Dx: Rheumatoid arthritis (M05.9)  Previously tried therapies: Humira - inadequate response Enbrel - injection site reaction  , PharmD, MPH, BCPS Clinical Pharmacist (Rheumatology and Pulmonology)

## 2021-03-23 NOTE — Patient Instructions (Signed)
Abatacept solution for injection (subcutaneous or intravenous use) What is this medication? ABATACEPT (a ba TA sept) is used to treat rheumatoid arthritis, psoriatic arthritis, and juvenile idiopathic arthritis. It prevents acutegraft-versus-host disease following a stem-cell transplant. This medicine may be used for other purposes; ask your health care provider orpharmacist if you have questions. COMMON BRAND NAME(S): Orencia What should I tell my care team before I take this medication? They need to know if you have any of these conditions: cancer diabetes hepatitis B or history of hepatitis B infection immune system problems infection or history of infection (especially a virus infection such as chickenpox, cold sores, or herpes) lung or breathing problems, like chronic obstructive pulmonary disease (COPD) recently received or scheduled to receive a vaccination scheduled to have surgery tuberculosis, a positive skin test for tuberculosis, or have recently been in close contact with someone who has tuberculosis an unusual or allergic reaction to abatacept, other medicines, foods, dyes, or preservatives pregnant or trying to get pregnant breast-feeding How should I use this medication? This medicine is for infusion into a vein or for injection under the skin. Infusions are given by a health care professional in a hospital or clinic setting. If you are to give your own medicine at home, you will be taught how to prepare and give this medicine under the skin. Use exactly as directed. Take your medicine at regular intervals. Do not take your medicine more often thandirected. It is important that you put your used needles and syringes in a special sharps container. Do not put them in a trash can. If you do not have a sharpscontainer, call your pharmacist or health care provider to get one. Talk to your pediatrician regarding the use of this medicine in children. While infusions in a clinic may  be prescribed for children as young as 2 years forselected conditions, precautions do apply. Overdosage: If you think you have taken too much of this medicine contact apoison control center or emergency room at once. NOTE: This medicine is only for you. Do not share this medicine with others. What if I miss a dose? This medicine is used once a week if given by injection under the skin. If you miss a dose, take it as soon as you can. If it is almost time for your nextdose, take only that dose. Do not take double or extra doses. If you are to be given an infusion of this medicine, it is important not to miss your dose. Doses are usually every 4 weeks. Call your doctor or healthcare professional if you are unable to keep an appointment. What may interact with this medication? Do not take this medicine with any of the following medications: live vaccines This medicine may also interact with the following medications: anakinra baricitinib canakinumab medicines that lower your chance of fighting an infection rituximab TNF blockers such as adalimumab, certolizumab, etanercept, golimumab, infliximab tocilizumab tofacitinib upadacitinib ustekinumab This list may not describe all possible interactions. Give your health care provider a list of all the medicines, herbs, non-prescription drugs, or dietary supplements you use. Also tell them if you smoke, drink alcohol, or use illegaldrugs. Some items may interact with your medicine. What should I watch for while using this medication? Visit your doctor for regular checks on your progress. Tell your doctor or health care professional if your symptoms do not start to get better or if theyget worse. You will be tested for tuberculosis (TB) before you start this medicine. If your doctor  prescribed any medicine for TB, you should start taking the TB medicine before starting this medicine. Make sure to finish the full course ofTB medicine. This medicine may  increase your risk of getting an infection. Call your doctor or health care professional if you get fever, chills, or sore throat, or other symptoms of a cold or flu. Do not treat yourself. Try to avoid being aroundpeople who are sick. If you have diabetes and are getting this medicine in a vein, the infusion can give false high blood sugar readings on the day of your dose. This may happen if you use certain types of blood glucose tests. Your health care provider maytell you to use a different way to monitor your blood sugar levels. What side effects may I notice from receiving this medication? Side effects that you should report to your doctor or health care professionalas soon as possible: allergic reactions like skin rash, itching or hives, swelling of the face, lips, or tongue breathing problems chest pain dizziness signs and symptoms of infection like fever; chills; cough; sore throat; pain or trouble passing urine unusually weak or tired Side effects that usually do not require medical attention (report to yourdoctor or health care professional if they continue or are bothersome): diarrhea headache nausea pain, redness, or irritation at site where injected stomach pain or upset This list may not describe all possible side effects. Call your doctor for medical advice about side effects. You may report side effects to FDA at1-800-FDA-1088. Where should I keep my medication? Infusions will be given in a hospital or clinic and will not be stored at home. Storage for syringes and autoinjectors stored at home: Keep out of the reach of children. Store in a refrigerator between 2 and 8 degrees C (36 and 46 degrees F). Keep this medicine in the original container. Protect from light. Do not freeze. Do not shake. Throw away any unused medicineafter the expiration date. NOTE: This sheet is a summary. It may not cover all possible information. If you have questions about this medicine, talk to your  doctor, pharmacist, orhealth care provider.  2022 Elsevier/Gold Standard (2020-07-31 15:20:39) Standing Labs We placed an order today for your standing lab work.   Please have your standing labs drawn in a month after starting Orencia and then every 3 months  If possible, please have your labs drawn 2 weeks prior to your appointment so that the provider can discuss your results at your appointment.  Please note that you may see your imaging and lab results in MyChart before we have reviewed them. We may be awaiting multiple results to interpret others before contacting you. Please allow our office up to 72 hours to thoroughly review all of the results before contacting the office for clarification of your results.  We have open lab daily: Monday through Thursday from 1:30-4:30 PM and Friday from 1:30-4:00 PM at the office of Dr. Pollyann Savoy, [facility].   Please be advised, all patients with office appointments requiring lab work will take precedent over walk-in lab work.  If possible, please come for your lab work on Monday and Friday afternoons, as you may experience shorter wait times. The office is located at [address], [place], [place] [address] No appointment is necessary.   Labs are drawn by [facility]. Please bring your co-pay at the time of your lab draw.  You may receive a bill from [facility] for your lab work.  If you wish to have your  labs drawn at another location, please call the office 24 hours in advance to send orders.  If you have any questions regarding directions or hours of operation,  please call [phone].   As a reminder, please drink plenty of water prior to coming for your lab work. Thanks!   Vaccines You are taking a medication(s) that can suppress your immune system.  The following immunizations are recommended: Flu annually Covid-19  Td/Tdap (tetanus, diphtheria, pertussis) every 10 years Pneumonia (Prevnar 15 then Pneumovax  23 at least 1 year apart.  Alternatively, can take Prevnar 20 without needing additional dose) Shingrix: 2 doses from 4 weeks to 6 months apart  Please check with your PCP to make sure you are up to date.   If you test POSITIVE for COVID19 and have MILD to MODERATE symptoms: First, call your PCP if you would like to receive COVID19 treatment AND Hold your medications during the infection and for at least 1 week after your symptoms have resolved: Injectable medication (Benlysta, Cimzia, Cosentyx, Enbrel, Humira, Orencia, Remicade, Simponi, Stelara, Taltz, Tremfya) Methotrexate Leflunomide (Arava) Azathioprine Mycophenolate (Cellcept) Osborne Oman, or Rinvoq Otezla If you take Actemra or Kevzara, you DO NOT need to hold these for COVID19 infection.  If you test POSITIVE for COVID19 and have NO symptoms: First, call your PCP if you would like to receive COVID19 treatment AND Hold your medications for at least 10 days after the day that you tested positive Injectable medication (Benlysta, Cimzia, Cosentyx, Enbrel, Humira, Orencia, Remicade, Simponi, Stelara, Taltz, Tremfya) Methotrexate Leflunomide (Arava) Azathioprine Mycophenolate (Cellcept) Osborne Oman, or Rinvoq Otezla If you take Actemra or Kevzara, you DO NOT need to hold these for COVID19 infection.  If you have signs or symptoms of an infection or start antibiotics: First, call your PCP for workup of your infection. Hold your medication through the infection, until you complete your antibiotics, and until symptoms resolve if you take the following: Injectable medication (Actemra, Benlysta, Cimzia, Cosentyx, Enbrel, Humira, Kevzara, Orencia, Remicade, Simponi, Stelara, Taltz, Tremfya) Methotrexate Leflunomide (Arava) Mycophenolate (Cellcept) Harriette Ohara, Olumiant, or Rinvoq

## 2021-03-24 ENCOUNTER — Other Ambulatory Visit (HOSPITAL_COMMUNITY): Payer: Self-pay

## 2021-03-24 LAB — COMPLETE METABOLIC PANEL WITH GFR
AG Ratio: 1.5 (calc) (ref 1.0–2.5)
ALT: 19 U/L (ref 6–29)
AST: 18 U/L (ref 10–30)
Albumin: 4.1 g/dL (ref 3.6–5.1)
Alkaline phosphatase (APISO): 34 U/L (ref 31–125)
BUN: 16 mg/dL (ref 7–25)
CO2: 24 mmol/L (ref 20–32)
Calcium: 9.1 mg/dL (ref 8.6–10.2)
Chloride: 105 mmol/L (ref 98–110)
Creat: 0.73 mg/dL (ref 0.50–0.97)
Globulin: 2.8 g/dL (calc) (ref 1.9–3.7)
Glucose, Bld: 87 mg/dL (ref 65–99)
Potassium: 4.7 mmol/L (ref 3.5–5.3)
Sodium: 138 mmol/L (ref 135–146)
Total Bilirubin: 0.5 mg/dL (ref 0.2–1.2)
Total Protein: 6.9 g/dL (ref 6.1–8.1)
eGFR: 111 mL/min/{1.73_m2} (ref >=60)

## 2021-03-24 LAB — CBC WITH DIFFERENTIAL/PLATELET
Absolute Monocytes: 498 cells/uL (ref 200–950)
Basophils Absolute: 126 cells/uL (ref 0–200)
Basophils Relative: 1.6 %
Eosinophils Absolute: 474 cells/uL (ref 15–500)
Eosinophils Relative: 6 %
HCT: 40.3 % (ref 35.0–45.0)
Hemoglobin: 13.5 g/dL (ref 11.7–15.5)
Lymphs Abs: 2173 cells/uL (ref 850–3900)
MCH: 31.8 pg (ref 27.0–33.0)
MCHC: 33.5 g/dL (ref 32.0–36.0)
MCV: 94.8 fL (ref 80.0–100.0)
MPV: 10.1 fL (ref 7.5–12.5)
Monocytes Relative: 6.3 %
Neutro Abs: 4629 cells/uL (ref 1500–7800)
Neutrophils Relative %: 58.6 %
Platelets: 267 10*3/uL (ref 140–400)
RBC: 4.25 10*6/uL (ref 3.80–5.10)
RDW: 11.4 % (ref 11.0–15.0)
Total Lymphocyte: 27.5 %
WBC: 7.9 10*3/uL (ref 3.8–10.8)

## 2021-03-24 MED ORDER — ORENCIA CLICKJECT 125 MG/ML ~~LOC~~ SOAJ
125.0000 mg | SUBCUTANEOUS | 0 refills | Status: DC
  Filled 2021-03-24: qty 4, fill #0
  Filled 2021-03-25 – 2021-04-05 (×3): qty 4, 28d supply, fill #0

## 2021-03-24 NOTE — Telephone Encounter (Signed)
Received notification from [facility] regarding a prior authorization for [facility]. Authorization has been APPROVED from 03/24/2021 until 03/24/2022 (no date range specifed, assuming one year until proven otherwise).  Per test claim, copay for 28 days supply is $0.  Patient can fill through [facility]: [phone]   Authorization # [id number]

## 2021-03-24 NOTE — Telephone Encounter (Signed)
Patient scheduled for Orencia new start on 04/06/21 at 9:30am (since she took Humira yesterday, 03/23/21). Patient aware that rx will be couriered from [facility] prior to appt.  Rx sent to [facility] Routing to Sullivan M to assist. Discontinued Humira from med list today  Chesley Mires, PharmD, MPH, BCPS Clinical Pharmacist (Rheumatology and Pulmonology)

## 2021-03-24 NOTE — Telephone Encounter (Signed)
Opened in error

## 2021-03-24 NOTE — Telephone Encounter (Signed)
Submitted a Prior Authorization request to [facility] for [facility] via CoverMyMeds. Will update once we receive a response.   Key: [id number]

## 2021-03-25 ENCOUNTER — Other Ambulatory Visit (HOSPITAL_COMMUNITY): Payer: Self-pay

## 2021-03-25 DIAGNOSIS — Z419 Encounter for procedure for purposes other than remedying health state, unspecified: Secondary | ICD-10-CM | POA: Diagnosis not present

## 2021-03-25 NOTE — Telephone Encounter (Signed)
Delivery instructions have been updated in [facility], medication will be [facility] on 9/9.  Rx has been processed in [facility] and the patient has no copay at this time.

## 2021-04-01 ENCOUNTER — Other Ambulatory Visit: Payer: Self-pay

## 2021-04-01 ENCOUNTER — Other Ambulatory Visit (HOSPITAL_COMMUNITY): Payer: Self-pay

## 2021-04-02 ENCOUNTER — Other Ambulatory Visit: Payer: Self-pay | Admitting: *Deleted

## 2021-04-02 MED ORDER — HAILEY 24 FE 1-20 MG-MCG(24) PO TABS: 1.0000 | ORAL_TABLET | Freq: Every day | ORAL | 2 refills | Status: DC

## 2021-04-02 NOTE — Telephone Encounter (Signed)
Pt called requesting that her OCP's be sent in for a 3 months supply at a time.  RF's sent to [facility].

## 2021-04-05 ENCOUNTER — Other Ambulatory Visit (HOSPITAL_COMMUNITY): Payer: Self-pay

## 2021-04-06 ENCOUNTER — Other Ambulatory Visit (HOSPITAL_COMMUNITY): Payer: Self-pay

## 2021-04-06 ENCOUNTER — Ambulatory Visit: Payer: Managed Care, Other (non HMO) | Admitting: Pharmacist

## 2021-04-06 ENCOUNTER — Other Ambulatory Visit: Payer: Self-pay

## 2021-04-06 VITALS — BP 114/70 | HR 96

## 2021-04-06 DIAGNOSIS — M0579 Rheumatoid arthritis with rheumatoid factor of multiple sites without organ or systems involvement: Secondary | ICD-10-CM

## 2021-04-06 DIAGNOSIS — Z7189 Other specified counseling: Secondary | ICD-10-CM

## 2021-04-06 DIAGNOSIS — Z79899 Other long term (current) drug therapy: Secondary | ICD-10-CM

## 2021-04-06 MED ORDER — ORENCIA CLICKJECT 125 MG/ML ~~LOC~~ SOAJ
125.0000 mg | SUBCUTANEOUS | 0 refills | Status: DC
  Filled 2021-04-06 – 2021-04-27 (×2): qty 8, 56d supply, fill #0

## 2021-04-06 NOTE — Patient Instructions (Signed)
Your next Orencia dose is due on 9/20, 9/27, and every 7 days thereafter CONTINUE leflunomide (Arava) as prescribed.  Your prescription will be shipped from [facility]. Their phone number is [phone] They will call to schedule your next shipment and confirm address. They will mail your medication to your home.   Labs are due in 1 month then every 3 months. Lab hours are from Monday to Thursday 1:30-4:30pm and Friday 1:30-4pm. You do not need an appointment if you come for labs during these times.  How to manage an injection site reaction: Remember the 5 C's: COUNTER - leave on the counter at least 30 minutes but up to overnight to bring medication to room temperature. This may help prevent stinging COLD - place something cold (like an ice gel pack or cold water bottle) on the injection site just before cleansing with alcohol. This may help reduce pain CLARITIN - use Claritin (generic name is loratadine) for the first two weeks of treatment or the day of, the day before, and the day after injecting. This will help to minimize injection site reactions CORTISONE CREAM - apply if injection site is irritated and itching CALL ME - if injection site reaction is bigger than the size of your fist, looks infected, blisters, or if you develop hives

## 2021-04-06 NOTE — Progress Notes (Signed)
Pharmacy Note  Subjective:   Patient presents to clinic today to receive first dose of Orencia. She is transitioning from Humira to which she had an inadequate clinical response as well as injection site reaction. Last Humira dose was on 03/23/21. She had waning clinical response and injection site reaction to Enbrel as well  Patient running a fever or have signs/symptoms of infection? No  Patient currently on antibiotics for the treatment of infection? No  Patient have any upcoming invasive procedures/surgeries? No  Objective: CMP     Component Value Date/Time   NA 138 03/23/2021 1154   K 4.7 03/23/2021 1154   CL 105 03/23/2021 1154   CO2 24 03/23/2021 1154   GLUCOSE 87 03/23/2021 1154   BUN 16 03/23/2021 1154   CREATININE 0.73 03/23/2021 1154   CALCIUM 9.1 03/23/2021 1154   PROT 6.9 03/23/2021 1154   AST 18 03/23/2021 1154   ALT 19 03/23/2021 1154   BILITOT 0.5 03/23/2021 1154   GFRNONAA 114 10/20/2020 1610   GFRAA 132 10/20/2020 1610    CBC    Component Value Date/Time   WBC 7.9 03/23/2021 1154   RBC 4.25 03/23/2021 1154   HGB 13.5 03/23/2021 1154   HCT 40.3 03/23/2021 1154   PLT 267 03/23/2021 1154   MCV 94.8 03/23/2021 1154   MCH 31.8 03/23/2021 1154   MCHC 33.5 03/23/2021 1154   RDW 11.4 03/23/2021 1154   LYMPHSABS 2,173 03/23/2021 1154   MONOABS 0.5 07/08/2016 1748   EOSABS 474 03/23/2021 1154   BASOSABS 126 03/23/2021 1154    Baseline Immunosuppressant Therapy Labs TB GOLD Quantiferon TB Gold Latest Ref Rng & Units 10/20/2020  Quantiferon TB Gold Plus NEGATIVE NEGATIVE   Hepatitis Panel Hepatitis Latest Ref Rng & Units 10/29/2019  Hep B Surface Ag NON-REACTI NON-REACTIVE  Hep B IgM NON-REACTI NON-REACTIVE   HIV Lab Results  Component Value Date   HIV NON-REACTIVE 04/27/2018   HIV NON-REACTIVE 12/01/2017   HIV Non Reactive 09/06/2016   HIV NONREACTIVE 05/04/2016   HIV NONREACTIVE 09/07/2015   Immunoglobulins Immunoglobulin Electrophoresis Latest  Ref Rng & Units 10/29/2019  IgA  47 - 310 mg/dL 409  IgG 811 - 9,147 mg/dL 8,295  IgM 50 - 621 mg/dL 98   SPEP Serum Protein Electrophoresis Latest Ref Rng & Units 03/23/2021  Total Protein 6.1 - 8.1 g/dL 6.9  Albumin 3.8 - 4.8 g/dL -  Alpha-1 0.2 - 0.3 g/dL -  Alpha-2 0.5 - 0.9 g/dL -  Beta Globulin 0.4 - 0.6 g/dL -  Beta 2 0.2 - 0.5 g/dL -  Gamma Globulin 0.8 - 1.7 g/dL -   H0QM No results found for: G6PDH TPMT No results found for: TPMT   Chest x-ray: 07/09/20 - no active cardiopulmonary disease  Assessment/Plan:  Demonstrated proper injection technique with Orencia demo device  Patient able to demonstrate proper injection technique using the teach back method.  Patient self injected in the left thigh with:  [facility]-Supplied Medication: Orencia 125mg /mL autoinjector NDC: Lot: [id number] Expiration: 09/2022  Patient tolerated well.  Observed for 30 mins in office for adverse reaction and none noted.   Patient is to return in 1 month for labs and 6-8 weeks for follow-up appointment (scheduled for 05/25/21).  Standing orders for CBC and CMP remain in place.   Orencia approved through insurance - no copay.   Rx sent to: [facility]: [phone] .  Patient provided with pharmacy phone number (she has used [facility] before).  She was provided with remainder of pharmacy-supplied Orencia pens (3 pens). Rx sent for 2 months supply to [facility] today.  All questions encouraged and answered.  Instructed patient to call with any further questions or concerns.  Chesley Mires, PharmD, MPH, BCPS Clinical Pharmacist (Rheumatology and Pulmonology)  04/06/2021 8:05 AM

## 2021-04-24 DIAGNOSIS — Z419 Encounter for procedure for purposes other than remedying health state, unspecified: Secondary | ICD-10-CM | POA: Diagnosis not present

## 2021-04-25 ENCOUNTER — Other Ambulatory Visit: Payer: Self-pay | Admitting: Rheumatology

## 2021-04-26 ENCOUNTER — Other Ambulatory Visit (HOSPITAL_COMMUNITY): Payer: Self-pay

## 2021-04-26 NOTE — Telephone Encounter (Signed)
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

## 2021-04-27 ENCOUNTER — Other Ambulatory Visit (HOSPITAL_COMMUNITY): Payer: Self-pay

## 2021-04-29 ENCOUNTER — Other Ambulatory Visit (HOSPITAL_COMMUNITY): Payer: Self-pay

## 2021-05-11 NOTE — Progress Notes (Deleted)
Office Visit Note  Patient: Robin [name]             Date of Birth: [birth year]           MRN: [id number]             PCP: Patient, No Pcp Per (Inactive) Referring: No ref. provider found Visit Date: 05/25/2021 Occupation: @GUAROCC @  Subjective:  Medication monitoring   History of Present Illness: Robin [name] is a 35 y.o. female with history of seropositive rheumatoid arthritis.  She is currently on Orencia 125 mg sq injections once weekly and arava 20 mg daily. Her first dose of orencia was administered on 04/06/21.   CBC and CMP updated on 03/23/21.  She is due to update CBC and CMP today.  Orders released.  Her next lab work will be due in February and every 3 months.  TB gold negative on 10/20/20.    Activities of Daily Living:  Patient reports morning stiffness for *** {minute/hour:19697}.   Patient {ACTIONS;DENIES/REPORTS:21021675::"Denies"} nocturnal pain.  Difficulty dressing/grooming: {ACTIONS;DENIES/REPORTS:21021675::"Denies"} Difficulty climbing stairs: {ACTIONS;DENIES/REPORTS:21021675::"Denies"} Difficulty getting out of chair: {ACTIONS;DENIES/REPORTS:21021675::"Denies"} Difficulty using hands for taps, buttons, cutlery, and/or writing: {ACTIONS;DENIES/REPORTS:21021675::"Denies"}  No Rheumatology ROS completed.   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/2020   Anxiety 09/21/2020   Rheumatoid arthritis involving multiple sites with positive rheumatoid factor (HCC) 01/14/2019   Normal labor 07/09/2018   Pregnancy with history of neonatal death May 15, 2018   Depression affecting pregnancy 04/02/2018   Irregular periods 01/26/2018   Hereditary disease in family possibly affecting fetus, affecting management of mother, antepartum condition or complication, not applicable or unspecified fetus    History of preterm delivery 11/17/2017   Previous cesarean section 11/17/2017    Family history of congenital anomaly 11/17/2017   Family history of congenital heart disease 07/27/2015   Family history of mitral valve prolapse 07/27/2015   Family hx of bicuspid aortic valve--FOB 04/03/2012    Past Medical History:  Diagnosis Date   Abnormal Pap smear 2008   HAD HPV;HAD GENITAL WART REMOVED;LAST PAP 07/2010 WAS NORMAL   Anemia    FeSO4 SUPP TAKEN IN PAST   Asthma    AS A CHILD;GREW OUT OF @ 8 YOA   Depression    after the death of her son, was on meds then   History of smoking 12/26/2011   Reports quit '12   Infection    UTI;CAN GET FREQ   Rheumatoid arthritis (HCC)    Vaginal Pap smear, abnormal     Family History  Problem Relation Age of Onset   Mitral valve prolapse Mother        DIED @ 16 84 OF CONDITION   Healthy Father    Mitral valve prolapse Paternal Aunt    Heart attack Paternal Grandfather        DECEASED   Dementia Maternal Grandmother    Healthy Son    Autism Daughter    Healthy Daughter    Healthy Daughter    Healthy Daughter    Past Surgical History:  Procedure Laterality Date   CESAREAN SECTION N/A 09/09/2016   Procedure: CESAREAN SECTION;  Surgeon: 09/11/2016, MD;  Location: [facility];  Service: Obstetrics;  Laterality: N/A;   NASAL SINUS SURGERY  2016   Social History   Social History Narrative   Not on file   There is no immunization  history for the selected administration types on file for this patient.   Objective: Vital Signs: There were no vitals taken for this visit.   Physical Exam Vitals and nursing note reviewed.  Constitutional:      Appearance: She is well-developed.  HENT:     Head: Normocephalic and atraumatic.  Eyes:     Conjunctiva/sclera: Conjunctivae normal.  Pulmonary:     Effort: Pulmonary effort is normal.  Abdominal:     Palpations: Abdomen is soft.  Musculoskeletal:     Cervical back: Normal range of motion.  Skin:    General: Skin is warm and dry.     Capillary Refill:  Capillary refill takes less than 2 seconds.  Neurological:     Mental Status: She is alert and oriented to person, place, and time.  Psychiatric:        Behavior: Behavior normal.     Musculoskeletal Exam: ***  CDAI Exam: CDAI Score: -- Patient Global: --; Provider Global: -- Swollen: --; Tender: -- Joint Exam 05/25/2021   No joint exam has been documented for this visit   There is currently no information documented on the homunculus. Go to the Rheumatology activity and complete the homunculus joint exam.  Investigation: No additional findings.  Imaging: No results found.  Recent Labs: Lab Results  Component Value Date   WBC 7.9 03/23/2021   HGB 13.5 03/23/2021   PLT 267 03/23/2021   NA 138 03/23/2021   K 4.7 03/23/2021   CL 105 03/23/2021   CO2 24 03/23/2021   GLUCOSE 87 03/23/2021   BUN 16 03/23/2021   CREATININE 0.73 03/23/2021   BILITOT 0.5 03/23/2021   AST 18 03/23/2021   ALT 19 03/23/2021   PROT 6.9 03/23/2021   CALCIUM 9.1 03/23/2021   GFRAA 132 10/20/2020   QFTBGOLDPLUS NEGATIVE 10/20/2020    Speciality Comments: humira 04/22,   PLQ, MTx, Enbrel  Procedures:  No procedures performed Allergies: Patient has no known allergies.   Assessment / Plan:     Visit Diagnoses: No diagnosis found.  Orders: No orders of the defined types were placed in this encounter.  No orders of the defined types were placed in this encounter.   Face-to-face time spent with patient was *** minutes. Greater than 50% of time was spent in counseling and coordination of care.  Follow-Up Instructions: No follow-ups on file.   Ellen Henri, CMA  Note - This record has been created using Animal nutritionist.  Chart creation errors have been sought, but may not always  have been located. Such creation errors do not reflect on  the standard of medical care.

## 2021-05-24 ENCOUNTER — Other Ambulatory Visit (HOSPITAL_COMMUNITY): Payer: Self-pay

## 2021-05-25 ENCOUNTER — Ambulatory Visit: Payer: Managed Care, Other (non HMO) | Admitting: Physician Assistant

## 2021-05-25 DIAGNOSIS — Z79899 Other long term (current) drug therapy: Secondary | ICD-10-CM

## 2021-05-25 DIAGNOSIS — Z8751 Personal history of pre-term labor: Secondary | ICD-10-CM

## 2021-05-25 DIAGNOSIS — M0579 Rheumatoid arthritis with rheumatoid factor of multiple sites without organ or systems involvement: Secondary | ICD-10-CM

## 2021-05-25 DIAGNOSIS — Z8774 Personal history of (corrected) congenital malformations of heart and circulatory system: Secondary | ICD-10-CM

## 2021-05-25 DIAGNOSIS — Z8279 Family history of other congenital malformations, deformations and chromosomal abnormalities: Secondary | ICD-10-CM

## 2021-05-25 DIAGNOSIS — G8929 Other chronic pain: Secondary | ICD-10-CM

## 2021-05-25 DIAGNOSIS — Z419 Encounter for procedure for purposes other than remedying health state, unspecified: Secondary | ICD-10-CM | POA: Diagnosis not present

## 2021-05-26 ENCOUNTER — Other Ambulatory Visit (HOSPITAL_COMMUNITY): Payer: Self-pay

## 2021-05-26 ENCOUNTER — Other Ambulatory Visit: Payer: Self-pay | Admitting: Rheumatology

## 2021-05-26 DIAGNOSIS — M0579 Rheumatoid arthritis with rheumatoid factor of multiple sites without organ or systems involvement: Secondary | ICD-10-CM

## 2021-05-26 MED ORDER — ORENCIA CLICKJECT 125 MG/ML ~~LOC~~ SOAJ
125.0000 mg | SUBCUTANEOUS | 0 refills | Status: DC
  Filled 2021-05-26 – 2021-06-22 (×2): qty 4, 28d supply, fill #0

## 2021-05-26 NOTE — Telephone Encounter (Signed)
Next Visit: Message sent to front desk to schedule appt, Return in about 2 months (around 05/23/2021) for Rheumatoid arthritis.  Last Visit: 03/23/2021  Last Fill: 04/06/2021  ZW:CHENIDPOEU arthritis with rheumatoid factor of multiple sites without organ or systems involvement  Current Dose per office note 03/23/2021:  Orencia 125 mg every 7 days  Labs: 03/23/2021, CBC and CMP WNL, 1 month after starting on Orencia, [facility] labs are due  TB Gold: 10/20/2020, negative   Okay to refill Orencia?

## 2021-05-26 NOTE — Telephone Encounter (Signed)
Please call patient to schedule appt. Thank you.  Return in about 2 months (around 05/23/2021) for Rheumatoid arthritis.

## 2021-05-27 ENCOUNTER — Other Ambulatory Visit (HOSPITAL_COMMUNITY): Payer: Self-pay

## 2021-05-28 ENCOUNTER — Other Ambulatory Visit (HOSPITAL_COMMUNITY): Payer: Self-pay

## 2021-06-22 ENCOUNTER — Other Ambulatory Visit (HOSPITAL_COMMUNITY): Payer: Self-pay

## 2021-06-24 DIAGNOSIS — Z419 Encounter for procedure for purposes other than remedying health state, unspecified: Secondary | ICD-10-CM | POA: Diagnosis not present

## 2021-06-29 ENCOUNTER — Other Ambulatory Visit (HOSPITAL_COMMUNITY): Payer: Self-pay

## 2021-07-21 ENCOUNTER — Other Ambulatory Visit: Payer: Self-pay | Admitting: Physician Assistant

## 2021-07-21 ENCOUNTER — Other Ambulatory Visit (HOSPITAL_COMMUNITY): Payer: Self-pay

## 2021-07-21 DIAGNOSIS — M0579 Rheumatoid arthritis with rheumatoid factor of multiple sites without organ or systems involvement: Secondary | ICD-10-CM

## 2021-07-22 ENCOUNTER — Other Ambulatory Visit (HOSPITAL_COMMUNITY): Payer: Self-pay

## 2021-07-25 DIAGNOSIS — Z419 Encounter for procedure for purposes other than remedying health state, unspecified: Secondary | ICD-10-CM | POA: Diagnosis not present

## 2021-07-26 ENCOUNTER — Other Ambulatory Visit (HOSPITAL_COMMUNITY): Payer: Self-pay

## 2021-08-25 ENCOUNTER — Other Ambulatory Visit (HOSPITAL_COMMUNITY): Payer: Self-pay

## 2021-08-25 DIAGNOSIS — Z419 Encounter for procedure for purposes other than remedying health state, unspecified: Secondary | ICD-10-CM | POA: Diagnosis not present

## 2021-09-08 ENCOUNTER — Other Ambulatory Visit (HOSPITAL_COMMUNITY): Payer: Self-pay

## 2021-09-13 ENCOUNTER — Other Ambulatory Visit (HOSPITAL_COMMUNITY): Payer: Self-pay

## 2021-09-16 ENCOUNTER — Other Ambulatory Visit (HOSPITAL_COMMUNITY): Payer: Self-pay

## 2021-09-22 DIAGNOSIS — J301 Allergic rhinitis due to pollen: Secondary | ICD-10-CM | POA: Diagnosis not present

## 2021-09-22 DIAGNOSIS — Z419 Encounter for procedure for purposes other than remedying health state, unspecified: Secondary | ICD-10-CM | POA: Diagnosis not present

## 2021-10-19 ENCOUNTER — Other Ambulatory Visit (HOSPITAL_COMMUNITY): Payer: Self-pay

## 2021-10-23 DIAGNOSIS — Z419 Encounter for procedure for purposes other than remedying health state, unspecified: Secondary | ICD-10-CM | POA: Diagnosis not present

## 2021-11-22 DIAGNOSIS — Z419 Encounter for procedure for purposes other than remedying health state, unspecified: Secondary | ICD-10-CM | POA: Diagnosis not present

## 2021-12-09 ENCOUNTER — Ambulatory Visit: Payer: Managed Care, Other (non HMO) | Admitting: Rheumatology

## 2021-12-13 NOTE — Progress Notes (Signed)
Office Visit Note  Patient: Robin [name]             Date of Birth: 1986-07-24           MRN: [id number]             PCP: Patient, No Pcp Per (Inactive) Referring: No ref. provider found Visit Date: 12/21/2021 Occupation: @GUAROCC @  Subjective:  Pain and swelling in multiple joints.  History of Present Illness: Robin [name] is a 36 y.o. female with seropositive rheumatoid arthritis.  She was on leflunomide 20 mg p.o. daily and Orencia 125 mg subcu weekly injections.  20 was started in August 2022.  She was on leflunomide since May 2021.  She states she felt that her disease went into remission and she decided to come off both medications in January 2023.  She states about a month ago she started having pain and swelling in multiple joints.  She complains of pain and discomfort in her bilateral shoulder blades, bilateral wrist, bilateral MCPs, hip joints and knees.  She noticed swelling in her hands and her knee joints.  She had upper respiratory tract infection about a month ago and was given a prednisone taper which she finished about 2 weeks ago.  It helped her joints as well.  Today she has pain and swelling in her left wrist, right fifth MCP joint and her right knee.  She wants to try Orencia monotherapy.   Activities of Daily Living:  Patient reports morning stiffness for 1 hour.   Patient Reports nocturnal pain.  Difficulty dressing/grooming: Reports Difficulty climbing stairs: Denies Difficulty getting out of chair: Denies Difficulty using hands for taps, buttons, cutlery, and/or writing: Reports  Review of Systems  Constitutional:  Positive for fatigue.  HENT:  Positive for mouth dryness. Negative for mouth sores and nose dryness.   Eyes:  Positive for itching and dryness. Negative for pain.  Respiratory:  Negative for shortness of breath and difficulty breathing.   Cardiovascular:  Negative for chest pain and palpitations.  Gastrointestinal:  Negative for blood in  stool, constipation and diarrhea.  Endocrine: Negative for increased urination.  Genitourinary:  Negative for difficulty urinating.  Musculoskeletal:  Positive for joint pain, joint pain, joint swelling, myalgias, morning stiffness, muscle tenderness and myalgias.  Skin:  Negative for color change, rash and redness.  Allergic/Immunologic: Positive for susceptible to infections.  Neurological:  Positive for numbness, parasthesias and weakness. Negative for dizziness, headaches and memory loss.  Hematological:  Negative for bruising/bleeding tendency.  Psychiatric/Behavioral:  Negative for confusion.    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/2020   Anxiety 09/21/2020   Rheumatoid arthritis involving multiple sites with positive rheumatoid factor (HCC) 01/14/2019   Normal labor 07/09/2018   Pregnancy with history of neonatal death 05/03/18   Depression affecting pregnancy 04/02/2018   Irregular periods 01/26/2018   Hereditary disease in family possibly affecting fetus, affecting management of mother, antepartum condition or complication, not applicable or unspecified fetus    History of preterm delivery 11/17/2017   Previous cesarean section 11/17/2017   Family history of congenital anomaly 11/17/2017   Family history of congenital heart disease 07/27/2015   Family history of mitral valve prolapse 07/27/2015   Family hx of bicuspid aortic valve--FOB 04/03/2012    Past Medical History:  Diagnosis Date   Abnormal Pap smear 2008   HAD HPV;HAD GENITAL WART  REMOVED;LAST PAP 07/2010 WAS NORMAL   Anemia    FeSO4 SUPP TAKEN IN PAST   Asthma    AS A CHILD;GREW OUT OF @ 8 YOA   Depression    after the death of her son, was on meds then   History of smoking 12/26/2011   Reports quit '12   Infection    UTI;CAN GET FREQ   Rheumatoid arthritis (HCC)    Vaginal Pap smear, abnormal     Family  History  Problem Relation Age of Onset   Mitral valve prolapse Mother        DIED @ 4630 53YOA OF CONDITION   Healthy Father    Mitral valve prolapse Paternal Aunt    Heart attack Paternal Grandfather        DECEASED   Dementia Maternal Grandmother    Healthy Son    Autism Daughter    Healthy Daughter    Healthy Daughter    Healthy Daughter    Past Surgical History:  Procedure Laterality Date   CESAREAN SECTION N/A 09/09/2016   Procedure: CESAREAN SECTION;  Surgeon: Tereso NewcomerUgonna A Anyanwu, MD;  Location: [facility];  Service: Obstetrics;  Laterality: N/A;   NASAL SINUS SURGERY  2016   Social History   Social History Narrative   Not on file   There is no immunization history for the selected administration types on file for this patient.   Objective: Vital Signs: BP 115/75 (BP Location: Left Arm, Patient Position: Sitting, Cuff Size: Normal)   Pulse 91   Ht 5\' 6"  (1.676 m)   Wt 116 lb 6.4 oz (52.8 kg)   BMI 18.79 kg/m    Physical Exam Vitals and nursing note reviewed.  Constitutional:      Appearance: She is well-developed.  HENT:     Head: Normocephalic and atraumatic.  Eyes:     Conjunctiva/sclera: Conjunctivae normal.  Cardiovascular:     Rate and Rhythm: Normal rate and regular rhythm.     Heart sounds: Normal heart sounds.  Pulmonary:     Effort: Pulmonary effort is normal.     Breath sounds: Normal breath sounds.  Abdominal:     General: Bowel sounds are normal.     Palpations: Abdomen is soft.  Musculoskeletal:     Cervical back: Normal range of motion.  Lymphadenopathy:     Cervical: No cervical adenopathy.  Skin:    General: Skin is warm and dry.     Capillary Refill: Capillary refill takes less than 2 seconds.  Neurological:     Mental Status: She is alert and oriented to person, place, and time.  Psychiatric:        Behavior: Behavior normal.     Musculoskeletal Exam: C-spine was in good range of motion.  Shoulder joints, elbow joints, wrist  joints with good range of motion.  She had synovitis over some of the MCPs and left wrist joints as described below.  She had discomfort range of motion of her right knee joint.  No warmth swelling or effusion was noted.  There was no tenderness over ankles or MTPs.  CDAI Exam: CDAI Score: 8.2  Patient Global: 6 mm; Provider Global: 6 mm Swollen: 3 ; Tender: 4  Joint Exam 12/21/2021      Right  Left  Wrist     Swollen Tender  MCP 2     Swollen Tender  MCP 5  Swollen Tender     Knee   Tender  Investigation: No additional findings.  Imaging: No results found.  Recent Labs: Lab Results  Component Value Date   WBC 7.9 03/23/2021   HGB 13.5 03/23/2021   PLT 267 03/23/2021   NA 138 03/23/2021   K 4.7 03/23/2021   CL 105 03/23/2021   CO2 24 03/23/2021   GLUCOSE 87 03/23/2021   BUN 16 03/23/2021   CREATININE 0.73 03/23/2021   BILITOT 0.5 03/23/2021   AST 18 03/23/2021   ALT 19 03/23/2021   PROT 6.9 03/23/2021   CALCIUM 9.1 03/23/2021   GFRAA 132 10/20/2020   QFTBGOLDPLUS NEGATIVE 10/20/2020    Speciality Comments: humira 04/22,   PLQ, MTx, Enbrel  Contraception-vasectomy  Procedures:  No procedures performed Allergies: Other   Assessment / Plan:     Visit Diagnoses: Rheumatoid arthritis with rheumatoid factor of multiple sites without organ or systems involvement (HCC) - She failed Humira, Enbrel, methotrexate and Plaquenil in the past. -She had been on combination of Orencia and leflunomide.  Leflunomide was started in May 2021 and Dub Amis was started in August 2022.  She states her disease went into remission and she decided to come off both medications in January 2023.  About a month ago she started having pain and swelling in multiple joints.  She states she was given a prednisone taper recently for upper respiratory tract infection and her joints felt better.  Last week she was having pain and swelling in her left hand.  Today she describes discomfort and  swelling in her left wrist joint, right fifth MCP and left second MCP joint.  She is also having pain and discomfort in her right knee joint.  No swelling in the knee joint was noted.  Synovitis was noted in the wrist joint and MCP joints as described above.  We had a detailed discussion regarding treatment options.  She wants to resume Orencia.  She does not want to take Arava.  Side effects of Orencia were reviewed again.  We will refill Orencia prescription once the labs are obtained.  We will also obtain x-rays to monitor for disease progression.  Plan: XR Hand 2 View Right, XR Hand 2 View Left, XR Foot 2 Views Right, XR Foot 2 Views Left.  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  No radiographic progression was noted in bilateral hands.  Increase in the size of erosion over the right fourth MTP joint was noted.  Medication counseling:   Counseled patient that Dub Amis is a selective T-cell costimulation blocker indicated for rheumatoid arthritis.  Counseled patient on purpose, proper use, and adverse effects of Orencia. The most common adverse effects are increased risk of infections, headache, and injection site reactions.  There is the possibility of an increased risk of malignancy but it is not well understood if this increased risk is due to the medication or the disease state.  Reviewed the importance of regular labs while on Orencia therapy.  Counseled patient that Dub Amis should be held prior to scheduled surgery.  Counseled patient to avoid live vaccines while on Orencia.  Advised patient to get annual influenza vaccine and the pneumococcal vaccine as indicated.  Provided patient with medication education material and answered all questions.  Patient consented to [facility].  Will upload consent into patient's chart.  Will apply for Orencia through patient's insurance.  Reviewed storage information for Orencia.  Advised initial injection must be  administered in office.     High risk medication use - Orencia 125 mg  subcutaneous one weekly and Arava 20 mg 1 tablet by mouth daily.  - Plan: CBC with Differential/Platelet, COMPLETE METABOLIC PANEL WITH GFR, QuantiFERON-TB Gold Plus today and every 3 months to monitor for drug toxicity.  TB gold will be obtained yearly.  Information regarding immunization was placed in the AVS.  She was advised to hold Orencia in case she develops an infection and resume after the infection resolves.  Chronic pain of left ankle - cortisone injection performed on 12/17/2020.  She had no synovitis in her left ankle joint today.  Family hx of bicuspid aortic valve--FOB  Family history of congenital heart disease  History of preterm delivery  Orders: Orders Placed This Encounter  Procedures   XR Hand 2 View Right   XR Hand 2 View Left   XR Foot 2 Views Right   XR Foot 2 Views Left   CBC with Differential/Platelet   COMPLETE METABOLIC PANEL WITH GFR   QuantiFERON-TB Gold Plus   No orders of the defined types were placed in this encounter.  .  Follow-Up Instructions: Return in about 3 months (around 03/23/2022) for Rheumatoid arthritis.   Pollyann Savoy, MD  Note - This record has been created using Animal nutritionist.  Chart creation errors have been sought, but may not always  have been located. Such creation errors do not reflect on  the standard of medical care.

## 2021-12-15 ENCOUNTER — Other Ambulatory Visit: Payer: Self-pay

## 2021-12-21 ENCOUNTER — Encounter: Payer: Self-pay | Admitting: Rheumatology

## 2021-12-21 ENCOUNTER — Ambulatory Visit (INDEPENDENT_AMBULATORY_CARE_PROVIDER_SITE_OTHER): Payer: Managed Care, Other (non HMO) | Admitting: Rheumatology

## 2021-12-21 ENCOUNTER — Ambulatory Visit (INDEPENDENT_AMBULATORY_CARE_PROVIDER_SITE_OTHER): Payer: Managed Care, Other (non HMO)

## 2021-12-21 ENCOUNTER — Telehealth: Payer: Self-pay

## 2021-12-21 VITALS — BP 115/75 | HR 91 | Ht 66.0 in | Wt 116.4 lb

## 2021-12-21 DIAGNOSIS — M0579 Rheumatoid arthritis with rheumatoid factor of multiple sites without organ or systems involvement: Secondary | ICD-10-CM

## 2021-12-21 DIAGNOSIS — M79641 Pain in right hand: Secondary | ICD-10-CM

## 2021-12-21 DIAGNOSIS — Z79899 Other long term (current) drug therapy: Secondary | ICD-10-CM | POA: Diagnosis not present

## 2021-12-21 DIAGNOSIS — M79672 Pain in left foot: Secondary | ICD-10-CM

## 2021-12-21 DIAGNOSIS — Z8774 Personal history of (corrected) congenital malformations of heart and circulatory system: Secondary | ICD-10-CM | POA: Diagnosis not present

## 2021-12-21 DIAGNOSIS — G8929 Other chronic pain: Secondary | ICD-10-CM

## 2021-12-21 DIAGNOSIS — Z8279 Family history of other congenital malformations, deformations and chromosomal abnormalities: Secondary | ICD-10-CM

## 2021-12-21 DIAGNOSIS — M25572 Pain in left ankle and joints of left foot: Secondary | ICD-10-CM

## 2021-12-21 DIAGNOSIS — Z8751 Personal history of pre-term labor: Secondary | ICD-10-CM

## 2021-12-21 NOTE — Telephone Encounter (Signed)
Please refill Orencia pending lab results, per Dr. Corliss Skains. Thanks!

## 2021-12-21 NOTE — Patient Instructions (Addendum)
Standing Labs We placed an order today for your standing lab work.   Please have your standing labs drawn in September and every 3 months  If possible, please have your labs drawn 2 weeks prior to your appointment so that the provider can discuss your results at your appointment.  Please note that you may see your imaging and lab results in MyChart before we have reviewed them. We may be awaiting multiple results to interpret others before contacting you. Please allow our office up to 72 hours to thoroughly review all of the results before contacting the office for clarification of your results.  We have open lab daily: Monday through Thursday from 1:30-4:30 PM and Friday from 1:30-4:00 PM at the office of Dr. Pollyann SavoyShaili Edilberto Roosevelt, [facility].   Please be advised, all patients with office appointments requiring lab work will take precedent over walk-in lab work.  If possible, please come for your lab work on Monday and Friday afternoons, as you may experience shorter wait times. The office is located at [address], [address], [place], [place] [id number] No appointment is necessary.   Labs are drawn by [facility]. Please bring your co-pay at the time of your lab draw.  You may receive a bill from [facility] for your lab work.  Please note if you are on Hydroxychloroquine and and an order has been placed for a Hydroxychloroquine level, you will need to have it drawn 4 hours or more after your last dose.  If you wish to have your labs drawn at another location, please call the office 24 hours in advance to send orders.  If you have any questions regarding directions or hours of operation,  please call [phone].   As a reminder, please drink plenty of water prior to coming for your lab work. Thanks!   Vaccines You are taking a medication(s) that can suppress your immune system.  The following immunizations are recommended: Flu annually Covid-19  Td/Tdap (tetanus, diphtheria,  pertussis) every 10 years Pneumonia (Prevnar 15 then Pneumovax 23 at least 1 year apart.  Alternatively, can take Prevnar 20 without needing additional dose) Shingrix: 2 doses from 4 weeks to 6 months apart  Please check with your PCP to make sure you are up to date.   If you have signs or symptoms of an infection or start antibiotics: First, call your PCP for workup of your infection. Hold your medication through the infection, until you complete your antibiotics, and until symptoms resolve if you take the following: Injectable medication (Actemra, Benlysta, Cimzia, Cosentyx, Enbrel, Humira, Kevzara, Orencia, Remicade, Simponi, Stelara, Taltz, Tremfya) Methotrexate Leflunomide (Arava) Mycophenolate (Cellcept) Harriette OharaXeljanz, Olumiant, or Rinvoq   Abatacept solution for injection (subcutaneous or intravenous use) What is this medication? ABATACEPT (a ba TA sept) is used to treat rheumatoid arthritis, psoriatic arthritis, and juvenile idiopathic arthritis. It prevents acute graft-versus-host disease following a stem-cell transplant. This medicine may be used for other purposes; ask your health care provider or pharmacist if you have questions. COMMON BRAND NAME(S): Reatha Armourrencia, Orencia ClickJect What should I tell my care team before I take this medication? They need to know if you have any of these conditions: cancer diabetes hepatitis B or history of hepatitis B infection immune system problems infection or history of infection (especially a virus infection such as chickenpox, cold sores, or herpes) lung or breathing problems, like chronic obstructive pulmonary disease (COPD) recently received or scheduled to receive a vaccination scheduled to have surgery tuberculosis, a positive skin test  for tuberculosis, or have recently been in close contact with someone who has tuberculosis an unusual or allergic reaction to abatacept, other medicines, foods, dyes, or preservatives pregnant or trying to  get pregnant breast-feeding How should I use this medication? This medicine is for infusion into a vein or for injection under the skin. Infusions are given by a health care professional in a hospital or clinic setting. If you are to give your own medicine at home, you will be taught how to prepare and give this medicine under the skin. Use exactly as directed. Take your medicine at regular intervals. Do not take your medicine more often than directed. It is important that you put your used needles and syringes in a special sharps container. Do not put them in a trash can. If you do not have a sharps container, call your pharmacist or health care provider to get one. Talk to your pediatrician regarding the use of this medicine in children. While infusions in a clinic may be prescribed for children as young as 2 years for selected conditions, precautions do apply. Overdosage: If you think you have taken too much of this medicine contact a poison control center or emergency room at once. NOTE: This medicine is only for you. Do not share this medicine with others. What if I miss a dose? This medicine is used once a week if given by injection under the skin. If you miss a dose, take it as soon as you can. If it is almost time for your next dose, take only that dose. Do not take double or extra doses. If you are to be given an infusion of this medicine, it is important not to miss your dose. Doses are usually every 4 weeks. Call your doctor or health care professional if you are unable to keep an appointment. What may interact with this medication? Do not take this medicine with any of the following medications: live vaccines This medicine may also interact with the following medications: anakinra baricitinib canakinumab medicines that lower your chance of fighting an infection rituximab TNF blockers such as adalimumab, certolizumab, etanercept, golimumab,  infliximab tocilizumab tofacitinib upadacitinib ustekinumab This list may not describe all possible interactions. Give your health care provider a list of all the medicines, herbs, non-prescription drugs, or dietary supplements you use. Also tell them if you smoke, drink alcohol, or use illegal drugs. Some items may interact with your medicine. What should I watch for while using this medication? Visit your doctor for regular checks on your progress. Tell your doctor or health care professional if your symptoms do not start to get better or if they get worse. You will be tested for tuberculosis (TB) before you start this medicine. If your doctor prescribed any medicine for TB, you should start taking the TB medicine before starting this medicine. Make sure to finish the full course of TB medicine. This medicine may increase your risk of getting an infection. Call your doctor or health care professional if you get fever, chills, or sore throat, or other symptoms of a cold or flu. Do not treat yourself. Try to avoid being around people who are sick. If you have diabetes and are getting this medicine in a vein, the infusion can give false high blood sugar readings on the day of your dose. This may happen if you use certain types of blood glucose tests. Your health care provider may tell you to use a different way to monitor your blood sugar levels. What  side effects may I notice from receiving this medication? Side effects that you should report to your doctor or health care professional as soon as possible: allergic reactions like skin rash, itching or hives, swelling of the face, lips, or tongue breathing problems chest pain dizziness signs and symptoms of infection like fever; chills; cough; sore throat; pain or trouble passing urine unusually weak or tired Side effects that usually do not require medical attention (report to your doctor or health care professional if they continue or are  bothersome): diarrhea headache nausea pain, redness, or irritation at site where injected stomach pain or upset This list may not describe all possible side effects. Call your doctor for medical advice about side effects. You may report side effects to FDA at 1-800-FDA-1088. Where should I keep my medication? Infusions will be given in a hospital or clinic and will not be stored at home. Storage for syringes and autoinjectors stored at home: Keep out of the reach of children. Store in a refrigerator between 2 and 8 degrees C (36 and 46 degrees F). Keep this medicine in the original container. Protect from light. Do not freeze. Do not shake. Throw away any unused medicine after the expiration date. NOTE: This sheet is a summary. It may not cover all possible information. If you have questions about this medicine, talk to your doctor, pharmacist, or health care provider.  2023 Elsevier/Gold Standard (2020-08-20 00:00:00)

## 2021-12-23 DIAGNOSIS — Z419 Encounter for procedure for purposes other than remedying health state, unspecified: Secondary | ICD-10-CM | POA: Diagnosis not present

## 2021-12-24 LAB — COMPLETE METABOLIC PANEL WITH GFR
AG Ratio: 1.5 (calc) (ref 1.0–2.5)
ALT: 13 U/L (ref 6–29)
AST: 15 U/L (ref 10–30)
Albumin: 4.5 g/dL (ref 3.6–5.1)
Alkaline phosphatase (APISO): 54 U/L (ref 31–125)
BUN: 9 mg/dL (ref 7–25)
CO2: 23 mmol/L (ref 20–32)
Calcium: 9.3 mg/dL (ref 8.6–10.2)
Chloride: 106 mmol/L (ref 98–110)
Creat: 0.83 mg/dL (ref 0.50–0.97)
Globulin: 3 g/dL (calc) (ref 1.9–3.7)
Glucose, Bld: 82 mg/dL (ref 65–99)
Potassium: 5.2 mmol/L (ref 3.5–5.3)
Sodium: 139 mmol/L (ref 135–146)
Total Bilirubin: 0.4 mg/dL (ref 0.2–1.2)
Total Protein: 7.5 g/dL (ref 6.1–8.1)
eGFR: 95 mL/min/{1.73_m2} (ref >=60)

## 2021-12-24 LAB — QUANTIFERON-TB GOLD PLUS
Mitogen-NIL: 3.31 IU/mL
NIL: 0.04 IU/mL
QuantiFERON-TB Gold Plus: NEGATIVE
TB1-NIL: <0 IU/mL
TB2-NIL: 0.04 IU/mL

## 2021-12-24 LAB — CBC WITH DIFFERENTIAL/PLATELET
Absolute Monocytes: 499 cells/uL (ref 200–950)
Basophils Absolute: 70 cells/uL (ref 0–200)
Basophils Relative: 0.9 %
Eosinophils Absolute: 601 cells/uL — ABNORMAL HIGH (ref 15–500)
Eosinophils Relative: 7.7 %
HCT: 40.5 % (ref 35.0–45.0)
Hemoglobin: 13.3 g/dL (ref 11.7–15.5)
Lymphs Abs: 1342 cells/uL (ref 850–3900)
MCH: 31.2 pg (ref 27.0–33.0)
MCHC: 32.8 g/dL (ref 32.0–36.0)
MCV: 95.1 fL (ref 80.0–100.0)
MPV: 10.1 fL (ref 7.5–12.5)
Monocytes Relative: 6.4 %
Neutro Abs: 5288 cells/uL (ref 1500–7800)
Neutrophils Relative %: 67.8 %
Platelets: 287 10*3/uL (ref 140–400)
RBC: 4.26 10*6/uL (ref 3.80–5.10)
RDW: 11.5 % (ref 11.0–15.0)
Total Lymphocyte: 17.2 %
WBC: 7.8 10*3/uL (ref 3.8–10.8)

## 2021-12-24 NOTE — Telephone Encounter (Signed)
TB Gold still pending, last Tb Gold in 2022 negative. Patient last had Orencia December 2022. Per Dr. Estanislado Pandy patient to restart here in the office. Patient advised and scheduled for 12/27/2021 at 9:30 am. Patient advised prescription will be sent in at that time. Patient expressed understanding.

## 2021-12-26 NOTE — Progress Notes (Signed)
CBC and CMP are normal.  TB Gold is negative.

## 2021-12-27 ENCOUNTER — Other Ambulatory Visit (HOSPITAL_COMMUNITY): Payer: Self-pay

## 2021-12-27 ENCOUNTER — Ambulatory Visit: Payer: Managed Care, Other (non HMO) | Admitting: Pharmacist

## 2021-12-29 ENCOUNTER — Ambulatory Visit (INDEPENDENT_AMBULATORY_CARE_PROVIDER_SITE_OTHER): Payer: Managed Care, Other (non HMO) | Admitting: Pharmacist

## 2021-12-29 ENCOUNTER — Other Ambulatory Visit (HOSPITAL_COMMUNITY): Payer: Self-pay

## 2021-12-29 VITALS — BP 104/72 | HR 91

## 2021-12-29 DIAGNOSIS — Z7189 Other specified counseling: Secondary | ICD-10-CM

## 2021-12-29 DIAGNOSIS — M0579 Rheumatoid arthritis with rheumatoid factor of multiple sites without organ or systems involvement: Secondary | ICD-10-CM

## 2021-12-29 DIAGNOSIS — Z79899 Other long term (current) drug therapy: Secondary | ICD-10-CM

## 2021-12-29 MED ORDER — ORENCIA CLICKJECT 125 MG/ML ~~LOC~~ SOAJ
125.0000 mg | SUBCUTANEOUS | 0 refills | Status: DC
  Filled 2021-12-29 – 2021-12-31 (×2): qty 12, 84d supply, fill #0

## 2021-12-29 NOTE — Progress Notes (Signed)
Pharmacy Note  Subjective:   Patient presents to clinic today to restart Orencia, last dose of Orencia and leflunomide in January 2023.  Patient running a fever or have signs/symptoms of infection? No  Patient currently on antibiotics for the treatment of infection? No  Patient have any upcoming invasive procedures/surgeries? No  Objective: CMP     Component Value Date/Time   NA 139 12/21/2021 1104   K 5.2 12/21/2021 1104   CL 106 12/21/2021 1104   CO2 23 12/21/2021 1104   GLUCOSE 82 12/21/2021 1104   BUN 9 12/21/2021 1104   CREATININE 0.83 12/21/2021 1104   CALCIUM 9.3 12/21/2021 1104   PROT 7.5 12/21/2021 1104   AST 15 12/21/2021 1104   ALT 13 12/21/2021 1104   BILITOT 0.4 12/21/2021 1104   GFRNONAA 114 10/20/2020 1610   GFRAA 132 10/20/2020 1610    CBC    Component Value Date/Time   WBC 7.8 12/21/2021 1104   RBC 4.26 12/21/2021 1104   HGB 13.3 12/21/2021 1104   HCT 40.5 12/21/2021 1104   PLT 287 12/21/2021 1104   MCV 95.1 12/21/2021 1104   MCH 31.2 12/21/2021 1104   MCHC 32.8 12/21/2021 1104   RDW 11.5 12/21/2021 1104   LYMPHSABS 1,342 12/21/2021 1104   MONOABS 0.5 07/08/2016 1748   EOSABS 601 (H) 12/21/2021 1104   BASOSABS 70 12/21/2021 1104    Baseline Immunosuppressant Therapy Labs TB GOLD    Latest Ref Rng & Units 12/21/2021   11:04 AM  Quantiferon TB Gold  Quantiferon TB Gold Plus NEGATIVE NEGATIVE     Hepatitis Panel    Latest Ref Rng & Units 10/29/2019   10:18 AM  Hepatitis  Hep B Surface Ag NON-REACTI NON-REACTIVE    Hep B IgM NON-REACTI NON-REACTIVE     HIV Lab Results  Component Value Date   HIV NON-REACTIVE 04/27/2018   HIV NON-REACTIVE 12/01/2017   HIV Non Reactive 09/06/2016   HIV NONREACTIVE 05/04/2016   HIV NONREACTIVE 09/07/2015   Immunoglobulins    Latest Ref Rng & Units 10/29/2019   10:18 AM  Immunoglobulin Electrophoresis  IgA  47 - 310 mg/dL 017    IgG 510 - 2,585 mg/dL 2,778    IgM 50 - 242 mg/dL 98     SPEP     Latest Ref Rng & Units 12/21/2021   11:04 AM  Serum Protein Electrophoresis  Total Protein 6.1 - 8.1 g/dL 7.5     P5TI No results found for: G6PDH TPMT No results found for: TPMT   Chest x-ray: 07/09/20- No active cardiopulmonary disease.  Assessment/Plan:  Demonstrated proper injection technique with Orencia demo device  Patient able to demonstrate proper injection technique using the teach back method.  Patient self injected in the right thigh with:  Sample Medication: Orencia Clickjet 125 mg/mL NDC: [address]-86 Lot: [id number] Expiration: 03/24  Patient tolerated well.  Observed for 30 mins in office for adverse reaction and none noted.   Patient is to return in 1 month for labs and 6-8 weeks for follow-up appointment.  Standing orders placed.   Orencia approved through insurance .   Rx sent to: [facility]: [phone] . Patient provided with pharmacy phone number and advised to call later this week to schedule shipment to home.  Patient will continue Orencia 125 mg SUBQ once weekly as monotherapy.  All questions encouraged and answered.  Instructed patient to call with any further questions or concerns.  Sherald Hess, PharmD Candidate 12/29/2021  8:45 AM

## 2021-12-29 NOTE — Patient Instructions (Addendum)
Your next ORENCIA dose is due on 6/14, 6/21, and every 7 days thereafter  HOLD ORENCIA if you have signs or symptoms of an infection. You can resume once you feel better or back to your baseline. HOLD ORENCIA if you start antibiotics to treat an infection. HOLD ORENCIA around the time of surgery/procedures. Your surgeon will be able to provide recommendations on when to hold BEFORE and when you are cleared to [facility]  Pharmacy information: Your prescription will be shipped from [facility]. Their phone number is [phone]   Labs are due in 1 month then every 3 months. Lab hours are from Monday to Thursday 1:30-4:30pm and Friday 1:30-4pm. You do not need an appointment if you come for labs during these times.  How to manage an injection site reaction: Remember the 5 C's: COUNTER - leave on the counter at least 30 minutes but up to overnight to bring medication to room temperature. This may help prevent stinging COLD - place something cold (like an ice gel pack or cold water bottle) on the injection site just before cleansing with alcohol. This may help reduce pain CLARITIN - use Claritin (generic name is loratadine) for the first two weeks of treatment or the day of, the day before, and the day after injecting. This will help to minimize injection site reactions CORTISONE CREAM - apply if injection site is irritated and itching CALL ME - if injection site reaction is bigger than the size of your fist, looks infected, blisters, or if you develop hives

## 2021-12-31 ENCOUNTER — Other Ambulatory Visit (HOSPITAL_COMMUNITY): Payer: Self-pay

## 2021-12-31 NOTE — Progress Notes (Signed)
Delivery instructions have been updated in [place], medication will be shipped to patient's home address by 6/13 once sufficient inventory is available.  Rx has been processed in [facility] as a 84-day supply and the patient has no copay at this time.

## 2022-01-01 ENCOUNTER — Other Ambulatory Visit (HOSPITAL_COMMUNITY): Payer: Self-pay

## 2022-01-14 ENCOUNTER — Other Ambulatory Visit: Payer: Self-pay | Admitting: Rheumatology

## 2022-01-14 ENCOUNTER — Other Ambulatory Visit: Payer: Self-pay | Admitting: Physician Assistant

## 2022-01-14 MED ORDER — PREDNISONE 5 MG PO TABS: ORAL_TABLET | ORAL | 0 refills | Status: DC

## 2022-01-22 DIAGNOSIS — Z419 Encounter for procedure for purposes other than remedying health state, unspecified: Secondary | ICD-10-CM | POA: Diagnosis not present

## 2022-02-22 DIAGNOSIS — Z419 Encounter for procedure for purposes other than remedying health state, unspecified: Secondary | ICD-10-CM | POA: Diagnosis not present

## 2022-03-09 NOTE — Progress Notes (Unsigned)
Office Visit Note  Patient: Robin [name]             Date of Birth: 18-Apr-1986           MRN: [id number]             PCP: Mackey Birchwood, NP Referring: No ref. provider found Visit Date: 03/23/2022 Occupation: @GUAROCC @  Subjective:  Recurrent flares   History of Present Illness: Robin [name] is a 36 y.o. female with history of seropositive rheumatoid arthritis. She is currently on Orencia 125 mg subcutaneous one weekly and Arava 20 mg 1 tablet by mouth daily.  She restarted on Orencia on 12/29/2021 and restarted on [place] on 01/18/2022.  Patient reports that she continues to have flares 2-3 times per month lasting 2 to 3 days per flare.  Her flares have been affecting both hands most frequently.  She states that during a recent flare her husband had to stay home from work due to her difficulty with ADLs and taking care of her children.  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  She is concerned that she may not be able to use her hands if her disease does not get better controlled.  She does a prednisone taper starting on 01/14/2022 which she has completed.  She has been taking ibuprofen sparingly for breakthrough symptoms.  She has not had any gaps in therapy since reinitiating [place].  She denies any side effects or injection site reactions.    Activities of Daily Living:  Patient reports morning stiffness for 0 minutes.   Patient Denies nocturnal pain.  Difficulty dressing/grooming: Denies Difficulty climbing stairs: Denies Difficulty getting out of chair: Denies Difficulty using hands for taps, buttons, cutlery, and/or writing: Denies  Review of Systems  Constitutional:  Positive for fatigue.  HENT:  Negative for mouth sores and mouth dryness.   Eyes:  Negative for dryness.  Respiratory:  Negative for shortness of breath.   Cardiovascular:  Negative for chest pain and palpitations.  Gastrointestinal:  Negative for blood in stool,  constipation and diarrhea.  Endocrine: Negative for increased urination.  Genitourinary:  Negative for involuntary urination.  Musculoskeletal:  Negative for joint pain, gait problem, joint pain, joint swelling, myalgias, muscle weakness, morning stiffness, muscle tenderness and myalgias.  Skin:  Negative for color change, rash, hair loss and sensitivity to sunlight.  Allergic/Immunologic: Negative for susceptible to infections.  Neurological:  Positive for headaches. Negative for dizziness.  Hematological:  Negative for swollen glands.  Psychiatric/Behavioral:  Positive for sleep disturbance. Negative for depressed mood. The patient is not nervous/anxious.     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/2020   Anxiety 09/21/2020   Rheumatoid arthritis involving multiple sites with positive rheumatoid factor ([place]) 01/14/2019   Normal labor 07/09/2018   Pregnancy with history of neonatal death 2018/05/17   Depression affecting pregnancy 04/02/2018   Irregular periods 01/26/2018   Hereditary disease in family possibly affecting fetus, affecting management of mother, antepartum condition or complication, not applicable or unspecified fetus    History of preterm delivery 11/17/2017   Previous cesarean section 11/17/2017   Family history of congenital anomaly 11/17/2017   Family history of congenital heart disease 07/27/2015   Family history of mitral valve prolapse 07/27/2015   Family hx of bicuspid aortic valve--FOB 04/03/2012    Past Medical History:  Diagnosis  Date   Abnormal Pap smear 2008   HAD HPV;HAD GENITAL WART REMOVED;LAST PAP 07/2010 WAS NORMAL   Anemia    FeSO4 SUPP TAKEN IN PAST   Asthma    AS A CHILD;GREW OUT OF @ 8 YOA   Depression    after the death of her son, was on meds then   History of smoking 12/26/2011   Reports quit '12   Infection    UTI;CAN GET FREQ   Rheumatoid  arthritis ([place])    Vaginal Pap smear, abnormal     Family History  Problem Relation Age of Onset   Mitral valve prolapse Mother        DIED @ 79 23 OF CONDITION   Healthy Father    Mitral valve prolapse Paternal Aunt    Heart attack Paternal Grandfather        DECEASED   Dementia Maternal Grandmother    Healthy Son    Autism Daughter    Healthy Daughter    Healthy Daughter    Healthy Daughter    Past Surgical History:  Procedure Laterality Date   CESAREAN SECTION N/A 09/09/2016   Procedure: CESAREAN SECTION;  Surgeon: Osborne Oman, MD;  Location: [facility];  Service: Obstetrics;  Laterality: N/A;   NASAL SINUS SURGERY  2016   Social History   Social History Narrative   Not on file   There is no immunization history for the selected administration types on file for this patient.   Objective: Vital Signs: BP 106/72 (BP Location: Left Arm, Patient Position: Sitting, Cuff Size: Normal)   Pulse 87   Resp 15   Ht 5\' 5"  (1.651 m)   Wt 110 lb 6.4 oz (50.1 kg)   LMP 02/03/2022   BMI 18.37 kg/m    Physical Exam Vitals and nursing note reviewed.  Constitutional:      Appearance: She is well-developed.  HENT:     Head: Normocephalic and atraumatic.  Eyes:     Conjunctiva/sclera: Conjunctivae normal.  Cardiovascular:     Rate and Rhythm: Normal rate and regular rhythm.     Heart sounds: Normal heart sounds.  Pulmonary:     Effort: Pulmonary effort is normal.     Breath sounds: Normal breath sounds.  Abdominal:     General: Bowel sounds are normal.     Palpations: Abdomen is soft.  Musculoskeletal:     Cervical back: Normal range of motion.  Skin:    General: Skin is warm and dry.     Capillary Refill: Capillary refill takes less than 2 seconds.  Neurological:     Mental Status: She is alert and oriented to person, place, and time.  Psychiatric:        Behavior: Behavior normal.      Musculoskeletal Exam: C-spine, thoracic spine, lumbar spine have  good range of motion.  No midline spinal tenderness.  Shoulder joints, elbow joints, wrist joints, MCPs, PIPs, DIPs have good range of motion with no synovitis.  Complete fist formation bilaterally.  Mild synovial thickening over the bilateral second and third MCP joints with mild tenderness but no active synovitis at this time.  Hip joints have good range of motion with no groin pain.  Knee joints have good range of motion with no warmth or effusion.  Ankle joints have good range of motion with no tenderness or synovitis.  No tenderness or synovitis over MTP joints.  CDAI Exam: CDAI Score: 5.4  Patient Global: 7 mm; Provider  Global: 7 mm Swollen: 0 ; Tender: 4  Joint Exam 03/23/2022      Right  Left  MCP 2   Tender   Tender  MCP 3   Tender   Tender     Investigation: No additional findings.  Imaging: No results found.  Recent Labs: Lab Results  Component Value Date   WBC 7.8 12/21/2021   HGB 13.3 12/21/2021   PLT 287 12/21/2021   NA 139 12/21/2021   K 5.2 12/21/2021   CL 106 12/21/2021   CO2 23 12/21/2021   GLUCOSE 82 12/21/2021   BUN 9 12/21/2021   CREATININE 0.83 12/21/2021   BILITOT 0.4 12/21/2021   AST 15 12/21/2021   ALT 13 12/21/2021   PROT 7.5 12/21/2021   CALCIUM 9.3 12/21/2021   GFRAA 132 10/20/2020   QFTBGOLDPLUS NEGATIVE 12/21/2021    Speciality Comments: humira 04/22,   PLQ, MTx, Enbrel  Contraception-vasectomy  Procedures:  No procedures performed Allergies: Other   Assessment / Plan:     Visit Diagnoses: Rheumatoid arthritis with rheumatoid factor of multiple sites without organ or systems involvement (HCC) - She failed Humira, Enbrel, methotrexate and Plaquenil in the past:  She continues to experience recurrent flares involving multiple joints.  She has been having 2-3 flares per month lasting 2 to 3 days per flare.  Her most recent flares have been involving both hands.  On examination today she has some tenderness over bilateral second and  third MCP joints but no active synovitis was noted.  She is currently on Orencia 125 mg sq injections once weekly and Arava 20 mg 1 tablet daily.  She restarted on Orencia on 12/29/2021 and restarted on the right on 01/18/2022.  She has been tolerating combination therapy without any side effects or injection site reactions from Orencia.  She was prescribed a prednisone taper on 01/14/2022 which she completed.  She has been taking ibuprofen very sparingly during her recent flares.  She has been trying to stay active walking on a daily basis for exercise along with riding her Peloton 20 to 60 minutes daily. Patient would like to give Orencia and Arava more time and for [place] to reassess for the full efficacy in 6 to 8 weeks.  Previous therapy includes Enbrel, Humira, methotrexate, and Plaquenil.  She plans on increasing her intake of natural anti-inflammatories including turmeric, ginger, omega-3, and tart cherry.  She also plans on trying to avoid triggers including gluten, red meat, and dairy. CBC, CMP, and sed rate will be updated today.  Discussed that if she continues to have recurrent flares on combination therapy we will need to discuss other treatment options at her follow-up visit in 6 to 8 weeks.  She will notify us if she develops any new or worsening symptoms in the meantime. - Plan: Sedimentation rate  High risk medication use - Orencia 125 mg subcutaneous injections once weekly and Arava 20 mg 1 tablet by mouth daily. - Plan: COMPLETE METABOLIC PANEL WITH GFR, CBC with Differential/Platelet Restarted Orencia on 12/29/2021 and restarted Arava on 01/18/2022.  Tolerating combination therapy without any side effects or injection site reactions from Orencia. CBC and CMP updated on 12/21/21. Orders for CBC and CMP released today.  Her next lab work will be due in early December and every 3 months.  TB gold negative on 12/21/21.  She has not had any recent or recurrent infections.  Discussed the importance of  holding orencia and arava if she develops signs or symptoms of  an infection and to resume once the infection has completely cleared.  Previous therapy includes Enbrel, Humira, methotrexate, and Plaquenil.  Chronic pain of left ankle - Cortisone injection performed on 12/17/2020.  She has not had any recent flares in the left ankle joint.  No tenderness or synovitis over the left ankle joint was noted today.  She has been trying to walk on a daily basis for exercise as well as riding her Peloton for 20 to 60 minutes/day.  Other medical conditions are listed as follows:   Family hx of bicuspid aortic valve--FOB  Family history of congenital heart disease  History of preterm delivery  Orders: Orders Placed This Encounter  Procedures   COMPLETE METABOLIC PANEL WITH GFR   CBC with Differential/Platelet   Sedimentation rate   No orders of the defined types were placed in this encounter.     Follow-Up Instructions: Return in about 2 months (around 05/23/2022) for Rheumatoid arthritis.   Gearldine Bienenstock, PA-C  Note - This record has been created using Dragon software.  Chart creation errors have been sought, but may not always  have been located. Such creation errors do not reflect on  the standard of medical care.

## 2022-03-17 ENCOUNTER — Other Ambulatory Visit (HOSPITAL_COMMUNITY)
Admission: RE | Admit: 2022-03-17 | Discharge: 2022-03-17 | Disposition: A | Discharge status: Home / Self Care | Payer: Managed Care, Other (non HMO) | Source: Ambulatory Visit | Attending: Obstetrics and Gynecology | Admitting: Obstetrics and Gynecology

## 2022-03-17 ENCOUNTER — Encounter: Payer: Self-pay | Admitting: Obstetrics and Gynecology

## 2022-03-17 ENCOUNTER — Ambulatory Visit (INDEPENDENT_AMBULATORY_CARE_PROVIDER_SITE_OTHER): Payer: Managed Care, Other (non HMO) | Admitting: Obstetrics and Gynecology

## 2022-03-17 ENCOUNTER — Other Ambulatory Visit (HOSPITAL_COMMUNITY): Payer: Self-pay

## 2022-03-17 VITALS — BP 100/68 | HR 94 | Resp 16 | Ht 67.0 in | Wt 109.0 lb

## 2022-03-17 DIAGNOSIS — N939 Abnormal uterine and vaginal bleeding, unspecified: Secondary | ICD-10-CM

## 2022-03-17 DIAGNOSIS — Z01419 Encounter for gynecological examination (general) (routine) without abnormal findings: Secondary | ICD-10-CM | POA: Insufficient documentation

## 2022-03-17 NOTE — Addendum Note (Signed)
Addended by: Kathie Dike on: 03/17/2022 02:08 PM   Modules accepted: Orders

## 2022-03-17 NOTE — Progress Notes (Signed)
Subjective:     Robin [name] is a 36 y.o. female P4 with LMP 03/17/22 who is here for a comprehensive physical exam. The patient reports a monthly period of 10 days with variable onset over the past 2 months. She also endorses hot flashes and night sweats. Her menses were previously regular. She is sexually active using vasectomy for contraception. She denies pelvic pain or abnormal discharge. She denies frequent headaches, visual changes or nipple drainage  Past Medical History:  Diagnosis Date   Abnormal Pap smear 2008   HAD HPV;HAD GENITAL WART REMOVED;LAST PAP 07/2010 WAS NORMAL   Anemia    FeSO4 SUPP TAKEN IN PAST   Asthma    AS A CHILD;GREW OUT OF @ 8 YOA   Depression    after the death of her son, was on meds then   History of smoking 12/26/2011   Reports quit '12   Infection    UTI;CAN GET FREQ   Rheumatoid arthritis (HCC)    Vaginal Pap smear, abnormal    Past Surgical History:  Procedure Laterality Date   CESAREAN SECTION N/A 09/09/2016   Procedure: CESAREAN SECTION;  Surgeon: Tereso Newcomer, MD;  [facility]: [facility];  Service: Obstetrics;  Laterality: N/A;   NASAL SINUS SURGERY  2016   Family History  Problem Relation Age of Onset   Mitral valve prolapse Mother        DIED @ 42 63 OF CONDITION   Healthy Father    Mitral valve prolapse Paternal Aunt    Heart attack Paternal Grandfather        DECEASED   Dementia Maternal Grandmother    Healthy Son    Autism Daughter    Healthy Daughter    Healthy Daughter    Healthy Daughter     Social History   Socioeconomic History   Marital status: Married    Spouse name: [employer]   Number of children: 1   Years of education: Not on file   Highest education level: Not on file  Occupational History   Occupation: SHIFT SUPERVISOR    Employer: [employer]  Tobacco Use   Smoking status: Former    Packs/day: 0.50    Years: 6.00    Total pack years: 3.00    Types: Cigarettes    Quit date: 06/27/2011    Years  since quitting: 10.7    Passive exposure: Never   Smokeless tobacco: Never  Vaping Use   Vaping Use: Never used  Substance and Sexual Activity   Alcohol use: Not Currently   Drug use: No   Sexual activity: Yes    Partners: Male    Birth control/protection: None, Surgical    Comment: Husband Vasectomy  Other Topics Concern   Not on file  Social History Narrative   Not on file   Social Determinants of Health   Financial Resource Strain: Not on file  Food Insecurity: Not on file  Transportation Needs: Not on file  Physical Activity: Not on file  Stress: Not on file  Social Connections: Not on file  Intimate Partner Violence: Not on file   Health Maintenance  Topic Date Due   COVID-19 Vaccine (1) Never done   Hepatitis C Screening  Never done   TETANUS/TDAP  Never done   PAP SMEAR-Modifier  08/24/2021   INFLUENZA VACCINE  02/22/2022   HIV Screening  Completed   HPV VACCINES  Aged Out       Review of Systems Pertinent  items noted in HPI and remainder of comprehensive ROS otherwise negative.   Objective:  Blood pressure 100/68, pulse 94, resp. rate 16, height 5\' 7"  (1.702 m), weight 109 lb (49.4 kg), last menstrual period 02/03/2022.   GENERAL: Well-developed, well-nourished female in no acute distress.  HEENT: Normocephalic, atraumatic. Sclerae anicteric.  NECK: Supple. Normal thyroid.  LUNGS: Clear to auscultation bilaterally.  HEART: Regular rate and rhythm. BREASTS: Symmetric in size. No palpable masses or lymphadenopathy, skin changes, or nipple drainage. Nipple piercing in place ABDOMEN: Soft, nontender, nondistended. No organomegaly. PELVIC: Normal external female genitalia. Vagina is pink and rugated.  Normal discharge. Normal appearing cervix. Uterus is normal in size. No adnexal mass or tenderness. Chaperone present during the pelvic exam EXTREMITIES: No cyanosis, clubbing, or edema, 2+ distal pulses.     Assessment:    Healthy female exam.      Plan:     Pap smears collected Health maintenance labs and prolactin ordered Pelvic ultrasound ordered Patient will be contacted with abnormal results See After Visit Summary for Counseling Recommendations

## 2022-03-21 ENCOUNTER — Other Ambulatory Visit (HOSPITAL_COMMUNITY): Payer: Self-pay

## 2022-03-21 ENCOUNTER — Other Ambulatory Visit: Payer: Self-pay | Admitting: Rheumatology

## 2022-03-21 DIAGNOSIS — M0579 Rheumatoid arthritis with rheumatoid factor of multiple sites without organ or systems involvement: Secondary | ICD-10-CM

## 2022-03-21 DIAGNOSIS — Z79899 Other long term (current) drug therapy: Secondary | ICD-10-CM

## 2022-03-21 MED ORDER — ORENCIA CLICKJECT 125 MG/ML ~~LOC~~ SOAJ
125.0000 mg | SUBCUTANEOUS | 0 refills | Status: DC
  Filled 2022-03-21: qty 12, 84d supply, fill #0

## 2022-03-21 NOTE — Telephone Encounter (Signed)
Next Visit: 03/23/2022  Last Visit: 12/21/2021  Last Fill: 12/29/2021  [id number] arthritis with rheumatoid factor of multiple sites without organ or systems involvement   Current Dose per office note 12/21/2021: Orencia 125 mg subcutaneous one weekly   Labs: 12/21/2021 CBC and CMP are normal.    TB Gold: 12/21/2021 Neg    Patient to update labs at upcoming appointment on 03/23/2022  Okay to refill Orencia?

## 2022-03-23 ENCOUNTER — Other Ambulatory Visit (HOSPITAL_COMMUNITY): Payer: Self-pay

## 2022-03-23 ENCOUNTER — Encounter: Payer: Self-pay | Admitting: Physician Assistant

## 2022-03-23 ENCOUNTER — Other Ambulatory Visit: Payer: Self-pay

## 2022-03-23 ENCOUNTER — Ambulatory Visit: Payer: Managed Care, Other (non HMO) | Attending: Physician Assistant | Admitting: Physician Assistant

## 2022-03-23 VITALS — BP 106/72 | HR 87 | Resp 15 | Ht 65.0 in | Wt 110.4 lb

## 2022-03-23 DIAGNOSIS — Z8774 Personal history of (corrected) congenital malformations of heart and circulatory system: Secondary | ICD-10-CM

## 2022-03-23 DIAGNOSIS — Z8751 Personal history of pre-term labor: Secondary | ICD-10-CM

## 2022-03-23 DIAGNOSIS — G8929 Other chronic pain: Secondary | ICD-10-CM

## 2022-03-23 DIAGNOSIS — Z8279 Family history of other congenital malformations, deformations and chromosomal abnormalities: Secondary | ICD-10-CM

## 2022-03-23 DIAGNOSIS — M25572 Pain in left ankle and joints of left foot: Secondary | ICD-10-CM

## 2022-03-23 DIAGNOSIS — Z79899 Other long term (current) drug therapy: Secondary | ICD-10-CM

## 2022-03-23 DIAGNOSIS — M0579 Rheumatoid arthritis with rheumatoid factor of multiple sites without organ or systems involvement: Secondary | ICD-10-CM | POA: Diagnosis not present

## 2022-03-23 LAB — COMPLETE METABOLIC PANEL WITH GFR
AG Ratio: 1.6 (calc) (ref 1.0–2.5)
ALT: 13 U/L (ref 6–29)
AST: 19 U/L (ref 10–30)
Albumin: 4.5 g/dL (ref 3.6–5.1)
Alkaline phosphatase (APISO): 54 U/L (ref 31–125)
BUN: 9 mg/dL (ref 7–25)
CO2: 28 mmol/L (ref 20–32)
Calcium: 9.4 mg/dL (ref 8.6–10.2)
Chloride: 107 mmol/L (ref 98–110)
Creat: 0.82 mg/dL (ref 0.50–0.97)
Globulin: 2.8 g/dL (calc) (ref 1.9–3.7)
Glucose, Bld: 82 mg/dL (ref 65–99)
Potassium: 5.2 mmol/L (ref 3.5–5.3)
Sodium: 140 mmol/L (ref 135–146)
Total Bilirubin: 0.3 mg/dL (ref 0.2–1.2)
Total Protein: 7.3 g/dL (ref 6.1–8.1)
eGFR: 96 mL/min/{1.73_m2} (ref >=60)

## 2022-03-23 LAB — CBC WITH DIFFERENTIAL/PLATELET
Absolute Monocytes: 420 cells/uL (ref 200–950)
Basophils Absolute: 101 cells/uL (ref 0–200)
Basophils Relative: 2.4 %
Eosinophils Absolute: 399 cells/uL (ref 15–500)
Eosinophils Relative: 9.5 %
HCT: 43.1 % (ref 35.0–45.0)
Hemoglobin: 14.2 g/dL (ref 11.7–15.5)
Lymphs Abs: 1319 cells/uL (ref 850–3900)
MCH: 30.6 pg (ref 27.0–33.0)
MCHC: 32.9 g/dL (ref 32.0–36.0)
MCV: 92.9 fL (ref 80.0–100.0)
MPV: 10.4 fL (ref 7.5–12.5)
Monocytes Relative: 10 %
Neutro Abs: 1961 cells/uL (ref 1500–7800)
Neutrophils Relative %: 46.7 %
Platelets: 249 10*3/uL (ref 140–400)
RBC: 4.64 10*6/uL (ref 3.80–5.10)
RDW: 11.8 % (ref 11.0–15.0)
Total Lymphocyte: 31.4 %
WBC: 4.2 10*3/uL (ref 3.8–10.8)

## 2022-03-23 LAB — SEDIMENTATION RATE: Sed Rate: 6 mm/h (ref 0–20)

## 2022-03-23 MED ORDER — LEFLUNOMIDE 20 MG PO TABS: 20.0000 mg | ORAL_TABLET | Freq: Every day | ORAL | 0 refills | Status: DC

## 2022-03-23 MED ORDER — ORENCIA CLICKJECT 125 MG/ML ~~LOC~~ SOAJ
125.0000 mg | SUBCUTANEOUS | 0 refills | Status: DC
  Filled 2022-03-23 (×2): qty 12, 84d supply, fill #0

## 2022-03-23 NOTE — Progress Notes (Signed)
CBC and CMP WNL

## 2022-03-23 NOTE — Telephone Encounter (Signed)
Please review and send orencia and arava prescriptions that were requested today at patient's appointment. Thanks!

## 2022-03-23 NOTE — Progress Notes (Signed)
ESR WNL

## 2022-03-23 NOTE — Patient Instructions (Signed)
Standing Labs We placed an order today for your standing lab work.   Please have your standing labs drawn in December and every 3 months   If possible, please have your labs drawn 2 weeks prior to your appointment so that the provider can discuss your results at your appointment.  Please note that you may see your imaging and lab results in MyChart before we have reviewed them. We may be awaiting multiple results to interpret others before contacting you. Please allow our office up to 72 hours to thoroughly review all of the results before contacting the office for clarification of your results.  We currently have open lab daily: Monday through Thursday from 1:30 PM-4:30 PM and Friday from 1:30 PM- 4:00 PM If possible, please come for your lab work on Monday, Thursday or Friday afternoons, as you may experience shorter wait times.   Effective May 25, 2022 the new lab hours will change to: Monday through Thursday from 1:30 PM-5:00 PM and Friday from 8:30 AM-12:00 PM If possible, please come for your lab work on Monday and Thursday afternoons, as you may experience shorter wait times.  Please be advised, all patients with office appointments requiring lab work will take precedent over walk-in lab work.    The office is located at [address], [address], [place], [place] [address] No appointment is necessary.   Labs are drawn by [facility]. Please bring your co-pay at the time of your lab draw.  You may receive a bill from [facility] for your lab work.  Please note if you are on Hydroxychloroquine and and an order has been placed for a Hydroxychloroquine level, you will need to have it drawn 4 hours or more after your last dose.  If you wish to have your labs drawn at another location, please call the office 24 hours in advance to send orders.  If you have any questions regarding directions or hours of operation,  please call [phone].   As a reminder, please drink plenty of water prior  to coming for your lab work. Thanks!   If you have signs or symptoms of an infection or start antibiotics: First, call your PCP for workup of your infection. Hold your medication through the infection, until you complete your antibiotics, and until symptoms resolve if you take the following: Injectable medication (Actemra, Benlysta, Cimzia, Cosentyx, Enbrel, Humira, Kevzara, Orencia, Remicade, Simponi, Stelara, Taltz, Tremfya) Methotrexate Leflunomide (Arava) Mycophenolate (Cellcept) Xeljanz, Olumiant, or Rinvoq Vaccines You are taking a medication(s) that can suppress your immune system.  The following immunizations are recommended: Flu annually Covid-19  Td/Tdap (tetanus, diphtheria, pertussis) every 10 years Pneumonia (Prevnar 15 then Pneumovax 23 at least 1 year apart.  Alternatively, can take Prevnar 20 without needing additional dose) Shingrix: 2 doses from 4 weeks to 6 months apart  Please check with your PCP to make sure you are up to date.   

## 2022-03-24 ENCOUNTER — Other Ambulatory Visit (HOSPITAL_COMMUNITY): Payer: Self-pay

## 2022-03-25 ENCOUNTER — Ambulatory Visit (INDEPENDENT_AMBULATORY_CARE_PROVIDER_SITE_OTHER): Payer: Managed Care, Other (non HMO)

## 2022-03-25 DIAGNOSIS — N939 Abnormal uterine and vaginal bleeding, unspecified: Secondary | ICD-10-CM | POA: Diagnosis not present

## 2022-03-25 DIAGNOSIS — Z419 Encounter for procedure for purposes other than remedying health state, unspecified: Secondary | ICD-10-CM | POA: Diagnosis not present

## 2022-03-29 ENCOUNTER — Other Ambulatory Visit: Payer: Managed Care, Other (non HMO)

## 2022-03-30 LAB — CYTOLOGY - PAP
Comment: NEGATIVE
Diagnosis: NEGATIVE
High risk HPV: NEGATIVE

## 2022-03-31 LAB — COMPREHENSIVE METABOLIC PANEL
ALT: 16 IU/L (ref 0–32)
AST: 20 IU/L (ref 0–40)
Albumin/Globulin Ratio: 2 (ref 1.2–2.2)
Albumin: 4.4 g/dL (ref 3.9–4.9)
Alkaline Phosphatase: 61 IU/L (ref 44–121)
BUN/Creatinine Ratio: 10 (ref 9–23)
BUN: 8 mg/dL (ref 6–20)
Bilirubin Total: 0.3 mg/dL (ref 0.0–1.2)
CO2: 24 mmol/L (ref 20–29)
Calcium: 9.5 mg/dL (ref 8.7–10.2)
Chloride: 101 mmol/L (ref 96–106)
Creatinine, Ser: 0.84 mg/dL (ref 0.57–1.00)
Globulin, Total: 2.2 g/dL (ref 1.5–4.5)
Glucose: 89 mg/dL (ref 70–99)
Potassium: 4.9 mmol/L (ref 3.5–5.2)
Sodium: 138 mmol/L (ref 134–144)
Total Protein: 6.6 g/dL (ref 6.0–8.5)
eGFR: 93 mL/min/{1.73_m2} (ref >59)

## 2022-03-31 LAB — CBC
Hematocrit: 40.9 % (ref 34.0–46.6)
Hemoglobin: 13.5 g/dL (ref 11.1–15.9)
MCH: 30.1 pg (ref 26.6–33.0)
MCHC: 33 g/dL (ref 31.5–35.7)
MCV: 91 fL (ref 79–97)
Platelets: 208 10*3/uL (ref 150–450)
RBC: 4.49 x10E6/uL (ref 3.77–5.28)
RDW: 12.2 % (ref 11.7–15.4)
WBC: 5.1 10*3/uL (ref 3.4–10.8)

## 2022-03-31 LAB — HEMOGLOBIN A1C
Est. average glucose Bld gHb Est-mCnc: 108 mg/dL
Hgb A1c MFr Bld: 5.4 % (ref 4.8–5.6)

## 2022-03-31 LAB — TSH: TSH: 2.5 u[IU]/mL (ref 0.450–4.500)

## 2022-03-31 LAB — PROLACTIN: Prolactin: 9.4 ng/mL (ref 4.8–23.3)

## 2022-04-24 DIAGNOSIS — Z419 Encounter for procedure for purposes other than remedying health state, unspecified: Secondary | ICD-10-CM | POA: Diagnosis not present

## 2022-05-04 ENCOUNTER — Other Ambulatory Visit (HOSPITAL_COMMUNITY)
Admission: RE | Admit: 2022-05-04 | Discharge: 2022-05-04 | Disposition: A | Discharge status: Home / Self Care | Payer: Managed Care, Other (non HMO) | Source: Ambulatory Visit | Attending: Obstetrics and Gynecology | Admitting: Obstetrics and Gynecology

## 2022-05-04 ENCOUNTER — Ambulatory Visit: Payer: Managed Care, Other (non HMO)

## 2022-05-04 DIAGNOSIS — N898 Other specified noninflammatory disorders of vagina: Secondary | ICD-10-CM | POA: Diagnosis present

## 2022-05-04 NOTE — Progress Notes (Signed)
Pt here for a self swab nurse visit. Pt c/o BV symptoms. Pt is aware we will contact with results.

## 2022-05-05 ENCOUNTER — Other Ambulatory Visit: Payer: Self-pay

## 2022-05-05 DIAGNOSIS — N760 Acute vaginitis: Secondary | ICD-10-CM

## 2022-05-05 LAB — CERVICOVAGINAL ANCILLARY ONLY
Bacterial Vaginitis (gardnerella): POSITIVE — AB
Candida Glabrata: NEGATIVE
Candida Vaginitis: NEGATIVE
Comment: NEGATIVE
Comment: NEGATIVE
Comment: NEGATIVE

## 2022-05-05 MED ORDER — METRONIDAZOLE 500 MG PO TABS: 500.0000 mg | ORAL_TABLET | Freq: Two times a day (BID) | ORAL | 0 refills | Status: DC

## 2022-05-05 NOTE — Progress Notes (Signed)
Flagyl 500mg  BID sent due to positive BV results per Protocol book.

## 2022-05-10 NOTE — Progress Notes (Unsigned)
Office Visit Note  Patient: Robin [name]             Date of Birth: 1985/08/26           MRN: [id number]             PCP: Donzetta Sprung, NP Referring: Donzetta Sprung, NP Visit Date: 05/24/2022 Occupation: @GUAROCC @  Subjective:  Medication monitoring   History of Present Illness: Robin [name] is a 36 y.o. female with history of seropositive rheumatoid arthritis.  She remains on Orencia 125 mg subcutaneous injections once weekly and Arava 20 mg 1 tablet by mouth daily.  She is tolerating combination therapy without any side effects.  She denies any signs or symptoms of a recent rheumatoid arthritis flare.  She has not had any morning stiffness or nocturnal pain.  She denies any difficulty with ADLs.  She denies any recent or recurrent infections.  She states that she continues to have daytime fatigue but feels that it is beyond her rheumatoid arthritis.  She had a recent at home sleep study and will be going over the results on 06/02/2022.  Activities of Daily Living:  Patient reports morning stiffness for 0 minutes.   Patient Denies nocturnal pain.  Difficulty dressing/grooming: Denies Difficulty climbing stairs: Denies Difficulty getting out of chair: Denies Difficulty using hands for taps, buttons, cutlery, and/or writing: Denies  Review of Systems  Constitutional:  Positive for fatigue.  HENT:  Negative for mouth sores and mouth dryness.   Eyes:  Negative for dryness.  Respiratory:  Negative for shortness of breath.   Cardiovascular:  Negative for chest pain and palpitations.  Gastrointestinal:  Negative for blood in stool, constipation and diarrhea.  Endocrine: Negative for increased urination.  Genitourinary:  Negative for involuntary urination.  Musculoskeletal:  Negative for joint pain, gait problem, joint pain, joint swelling, myalgias, muscle weakness, morning stiffness, muscle tenderness and myalgias.  Skin:  Negative for color change, rash, hair loss and  sensitivity to sunlight.  Allergic/Immunologic: Positive for susceptible to infections.  Neurological:  Negative for dizziness and headaches.  Hematological:  Negative for swollen glands.  Psychiatric/Behavioral:  Positive for sleep disturbance. Negative for depressed mood. The patient is not nervous/anxious.     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/2020   Anxiety 09/21/2020   Rheumatoid arthritis involving multiple sites with positive rheumatoid factor (HCC) 01/14/2019   Normal labor 07/09/2018   Pregnancy with history of neonatal death May 25, 2018   Depression affecting pregnancy 04/02/2018   Irregular periods 01/26/2018   Hereditary disease in family possibly affecting fetus, affecting management of mother, antepartum condition or complication, not applicable or unspecified fetus    History of preterm delivery 11/17/2017   Previous cesarean section 11/17/2017   Family history of congenital anomaly 11/17/2017   Family history of congenital heart disease 07/27/2015   Family history of mitral valve prolapse 07/27/2015   Family hx of bicuspid aortic valve--FOB 04/03/2012    Past Medical History:  Diagnosis Date   Abnormal Pap smear 2008   HAD HPV;HAD GENITAL WART REMOVED;LAST PAP 07/2010 WAS NORMAL   Anemia    FeSO4 SUPP TAKEN IN PAST   Asthma    AS A CHILD;GREW OUT OF @ 8 YOA   Depression    after the death of her son, was on meds then   History of smoking 12/26/2011   Reports quit '12  Infection    UTI;CAN GET FREQ   Rheumatoid arthritis ([place])    Vaginal Pap smear, abnormal     Family History  Problem Relation Age of Onset   Mitral valve prolapse Mother        DIED @ 84 78 OF CONDITION   Healthy Father    Mitral valve prolapse Paternal Aunt    Heart attack Paternal Grandfather        DECEASED   Dementia Maternal Grandmother    Healthy Son    Autism Daughter     Healthy Daughter    Healthy Daughter    Healthy Daughter    Past Surgical History:  Procedure Laterality Date   CESAREAN SECTION N/A 09/09/2016   Procedure: CESAREAN SECTION;  Surgeon: Osborne Oman, MD;  Location: [facility];  Service: Obstetrics;  Laterality: N/A;   NASAL SINUS SURGERY  2016   Social History   Social History Narrative   Not on file   There is no immunization history for the selected administration types on file for this patient.   Objective: Vital Signs: BP 105/71 (BP Location: Left Arm, Patient Position: Sitting, Cuff Size: Normal)   Pulse (!) 102   Resp 13   Ht 5\' 5"  (1.651 m)   Wt 114 lb 12.8 oz (52.1 kg)   BMI 19.10 kg/m    Physical Exam Vitals and nursing note reviewed.  Constitutional:      Appearance: She is well-developed.  HENT:     Head: Normocephalic and atraumatic.  Eyes:     Conjunctiva/sclera: Conjunctivae normal.  Cardiovascular:     Rate and Rhythm: Normal rate and regular rhythm.     Heart sounds: Normal heart sounds.  Pulmonary:     Effort: Pulmonary effort is normal.     Breath sounds: Normal breath sounds.  Abdominal:     General: Bowel sounds are normal.     Palpations: Abdomen is soft.  Musculoskeletal:     Cervical back: Normal range of motion.  Skin:    General: Skin is warm and dry.     Capillary Refill: Capillary refill takes less than 2 seconds.  Neurological:     Mental Status: She is alert and oriented to person, place, and time.  Psychiatric:        Behavior: Behavior normal.      Musculoskeletal Exam: C-spine, thoracic spine, lumbar spine have good range of motion.  No midline spinal tenderness.  No SI joint tenderness.  Shoulder joints, elbow joints, wrist joints, MCPs, PIPs, DIPs have good range of motion with no synovitis.  Complete fist formation bilaterally.  Hip joints have good range of motion with no groin pain.  Knee joints have good range of motion with no warmth or effusion.  Ankle joints  have good range of motion with no tenderness or joint swelling.   CDAI Exam: CDAI Score: -- Patient Global: 2 mm; Provider Global: 2 mm Swollen: --; Tender: -- Joint Exam 05/24/2022   No joint exam has been documented for this visit   There is currently no information documented on the homunculus. Go to the Rheumatology activity and complete the homunculus joint exam.  Investigation: No additional findings.  Imaging: No results found.  Recent Labs: Lab Results  Component Value Date   WBC 5.1 03/30/2022   HGB 13.5 03/30/2022   PLT 208 03/30/2022   NA 138 03/30/2022   K 4.9 03/30/2022   CL 101 03/30/2022   CO2 24 03/30/2022  GLUCOSE 89 03/30/2022   BUN 8 03/30/2022   CREATININE 0.84 03/30/2022   BILITOT 0.3 03/30/2022   ALKPHOS 61 03/30/2022   AST 20 03/30/2022   ALT 16 03/30/2022   PROT 6.6 03/30/2022   ALBUMIN 4.4 03/30/2022   CALCIUM 9.5 03/30/2022   GFRAA 132 10/20/2020   QFTBGOLDPLUS NEGATIVE 12/21/2021    Speciality Comments: humira 04/22,   PLQ, MTx, Enbrel  Contraception-vasectomy  Procedures:  No procedures performed Allergies: Other    Assessment / Plan:     Visit Diagnoses: Rheumatoid arthritis with rheumatoid factor of multiple sites without organ or systems involvement (HCC) - She failed Humira, Enbrel, methotrexate and Plaquenil in the past: She has no joint tenderness or synovitis on examination today.  She has not had any signs or symptoms of a rheumatoid arthritis flare.  She has clinically been doing well on Orencia 125 mg subcutaneous injections once weekly and Arava 20 mg 1 tablet by mouth daily.  She is tolerating combination therapy without any side effects or injection site reactions from Orencia.  She has not had any recent or recurrent infections.  She has not had any morning stiffness, nocturnal pain, or difficulty with ADLs.  She will remain on combination therapy as prescribed.  She will follow-up in the office in 3 months or sooner if  needed.  High risk medication use - Orencia 125 mg subcutaneous injections once weekly and Arava 20 mg 1 tablet by mouth daily. CBC and CMP updated on 03/30/22.  Her next lab work will be due in December and every 3 months.  Standing orders for CBC and CMP remain in place. TB gold negative on 12/21/21.  She has not had any recent or recurrent infections. Discussed the importance of holding orencia and arava if she develops signs or symptoms of an infection and to resume once the infection has completely cleared.  She does not plan on getting the annual flu shot.  Chronic pain of left ankle - Cortisone injection performed on 12/17/2020.  Other fatigue: She continues to experience daytime fatigue and insomnia.  She tried a course of trazodone but continued to have interrupted sleep at night.  She had a recent sleep study and will be receiving the results on 06/02/2022.  Other medical conditions are listed as follows:   Family hx of bicuspid aortic valve--FOB  Family history of congenital heart disease  History of preterm delivery  Orders: No orders of the defined types were placed in this encounter.  No orders of the defined types were placed in this encounter.   Follow-Up Instructions: Return in about 3 months (around 08/24/2022) for Rheumatoid arthritis.   Gearldine Bienenstock, PA-C  Note - This record has been created using Dragon software.  Chart creation errors have been sought, but may not always  have been located. Such creation errors do not reflect on  the standard of medical care.

## 2022-05-24 ENCOUNTER — Ambulatory Visit: Payer: Managed Care, Other (non HMO) | Attending: Physician Assistant | Admitting: Physician Assistant

## 2022-05-24 ENCOUNTER — Encounter: Payer: Self-pay | Admitting: Physician Assistant

## 2022-05-24 VITALS — BP 105/71 | HR 102 | Resp 13 | Ht 65.0 in | Wt 114.8 lb

## 2022-05-24 DIAGNOSIS — M25572 Pain in left ankle and joints of left foot: Secondary | ICD-10-CM

## 2022-05-24 DIAGNOSIS — M0579 Rheumatoid arthritis with rheumatoid factor of multiple sites without organ or systems involvement: Secondary | ICD-10-CM | POA: Diagnosis not present

## 2022-05-24 DIAGNOSIS — Z79899 Other long term (current) drug therapy: Secondary | ICD-10-CM

## 2022-05-24 DIAGNOSIS — G8929 Other chronic pain: Secondary | ICD-10-CM

## 2022-05-24 DIAGNOSIS — R5383 Other fatigue: Secondary | ICD-10-CM

## 2022-05-24 DIAGNOSIS — Z8751 Personal history of pre-term labor: Secondary | ICD-10-CM

## 2022-05-24 DIAGNOSIS — Z8774 Personal history of (corrected) congenital malformations of heart and circulatory system: Secondary | ICD-10-CM

## 2022-05-24 DIAGNOSIS — Z8279 Family history of other congenital malformations, deformations and chromosomal abnormalities: Secondary | ICD-10-CM

## 2022-05-24 NOTE — Patient Instructions (Signed)
Standing Labs We placed an order today for your standing lab work.   Please have your standing labs drawn in December and every 3 months    Please have your labs drawn 2 weeks prior to your appointment so that the provider can discuss your lab results at your appointment.  Please note that you may see your imaging and lab results in MyChart before we have reviewed them. We will contact you once all results are reviewed. Please allow our office up to 72 hours to thoroughly review all of the results before contacting the office for clarification of your results.  Lab hours are:   Monday through Thursday from 8:00 am -12:30 pm and 1:00 pm-5:00 pm and Friday from 8:00 am-12:00 pm.  Please be advised, all patients with office appointments requiring lab work will take precedent over walk-in lab work.   Labs are drawn by [facility]. Please bring your co-pay at the time of your lab draw.  You may receive a bill from [facility] for your lab work.  Please note if you are on Hydroxychloroquine and and an order has been placed for a Hydroxychloroquine level, you will need to have it drawn 4 hours or more after your last dose.  If you wish to have your labs drawn at another location, please call the office 24 hours in advance so we can fax the orders.  The office is located at [address], [address], , [place] [address] No appointment is necessary.    If you have any questions regarding directions or hours of operation,  please call [phone].   As a reminder, please drink plenty of water prior to coming for your lab work. Thanks!  

## 2022-05-25 DIAGNOSIS — Z419 Encounter for procedure for purposes other than remedying health state, unspecified: Secondary | ICD-10-CM | POA: Diagnosis not present

## 2022-05-31 IMAGING — CT CT ABD-PELV W/ CM
2 of 4 series · 15 of 46 positions shown, 17 images · IV contrast (APPLIED)
Comparison: None

CLINICAL DATA: LEFT lower quadrant abdominal pain and back pain for
3 days, positive home COVID test 2 days ago followed by negative
tested CTS less bowels positive for COVID

EXAM:
CT ABDOMEN AND PELVIS WITH CONTRAST
TECHNIQUE: Multidetector CT imaging of the abdomen and pelvis was performed
using the standard protocol following bolus administration of
intravenous contrast. Sagittal and coronal MPR images reconstructed
from axial data set.
CONTRAST:  100mL OMNIPAQUE IOHEXOL 300 MG/ML SOLN IV. Dilute oral
contrast.

[Series 2: axial st · axial · 0.80mm/px · z∈[-483,-38]mm · 12 of 99 slices shown, 14 images]
[im 5/99  soft-tissue]
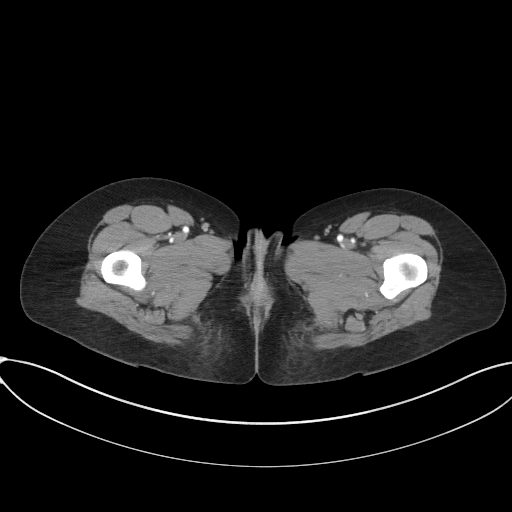
[im 5/99  bone]
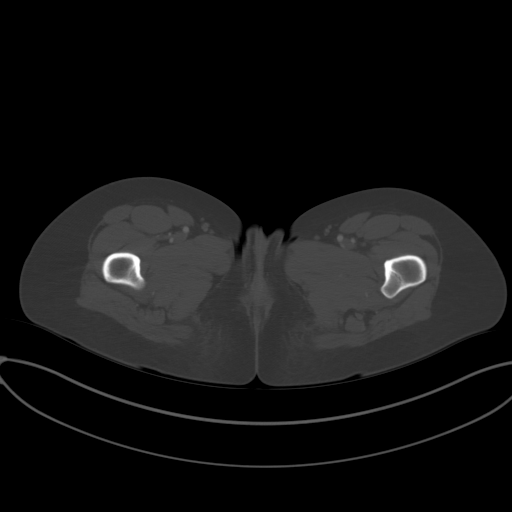
[im 13/99  soft-tissue]
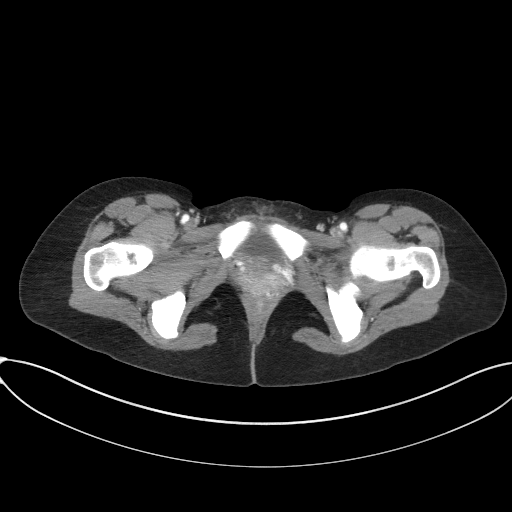
[im 21/99  soft-tissue]
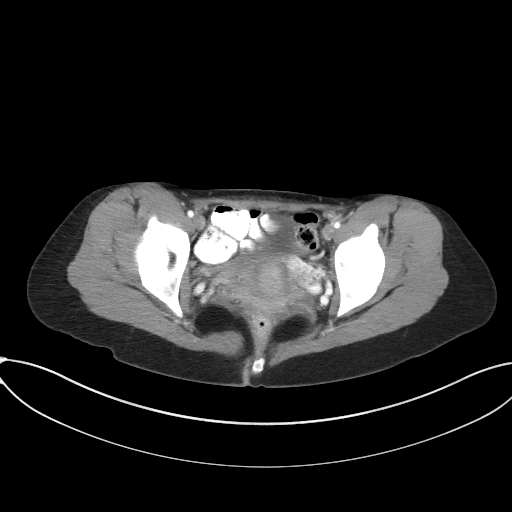
[im 29/99  soft-tissue]
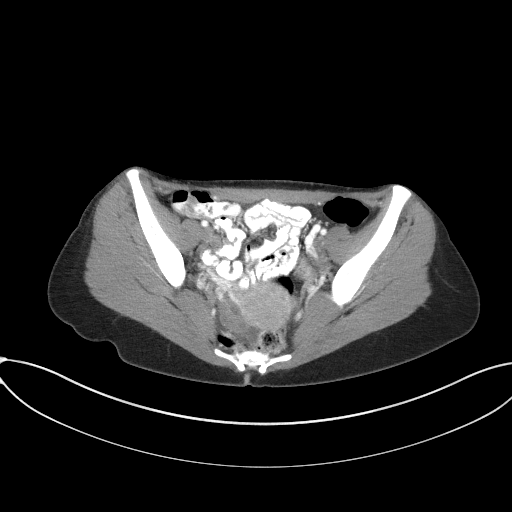
[im 37/99  soft-tissue]
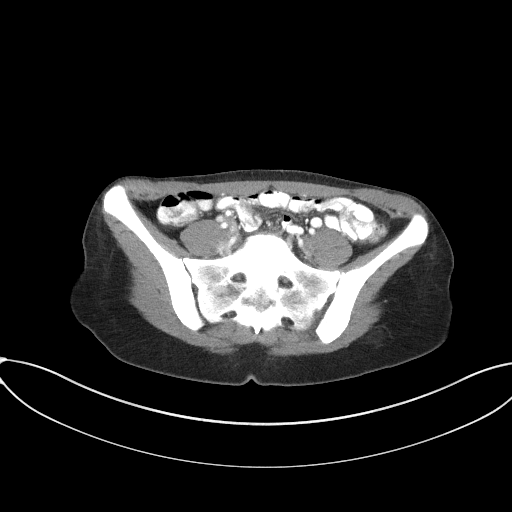
[im 45/99  soft-tissue]
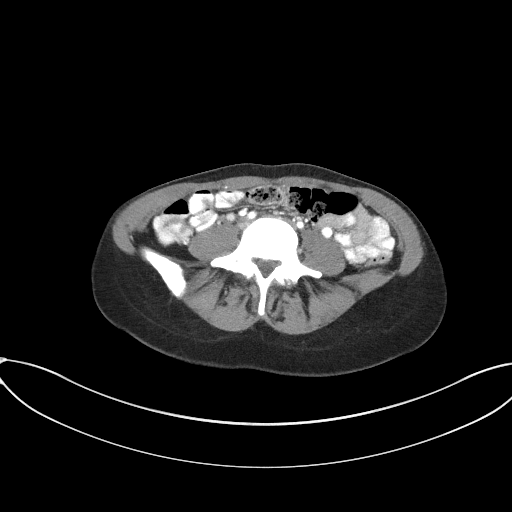
[im 54/99  soft-tissue]
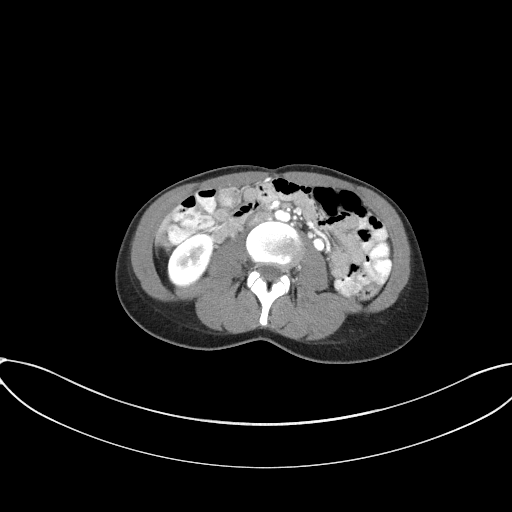
[im 62/99  soft-tissue]
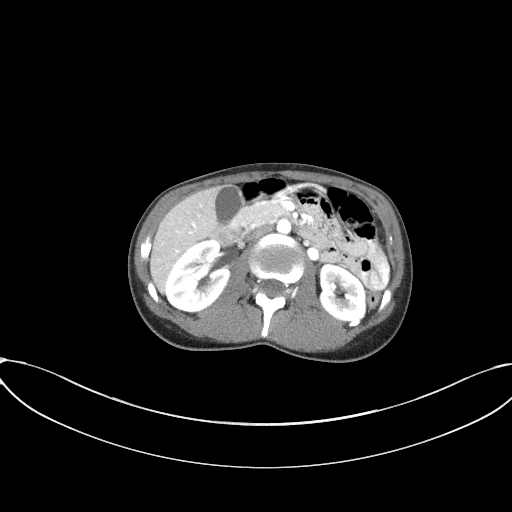
[im 70/99  soft-tissue]
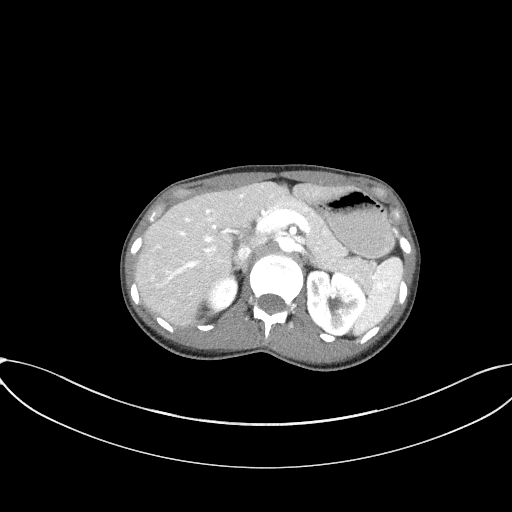
[im 70/99  bone]
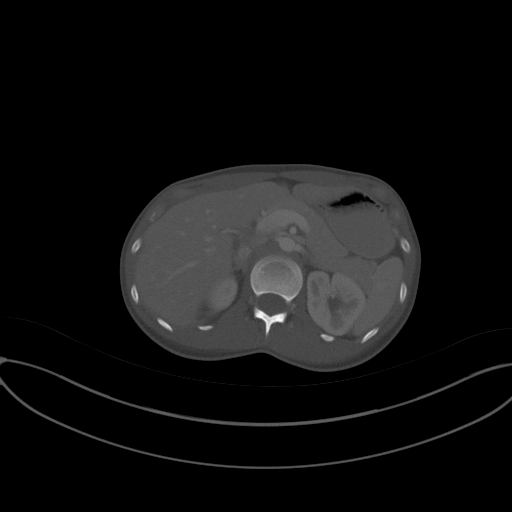
[im 78/99  soft-tissue]
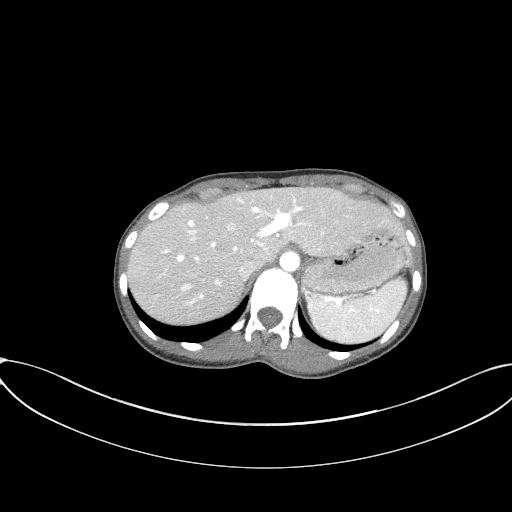
[im 86/99  soft-tissue]
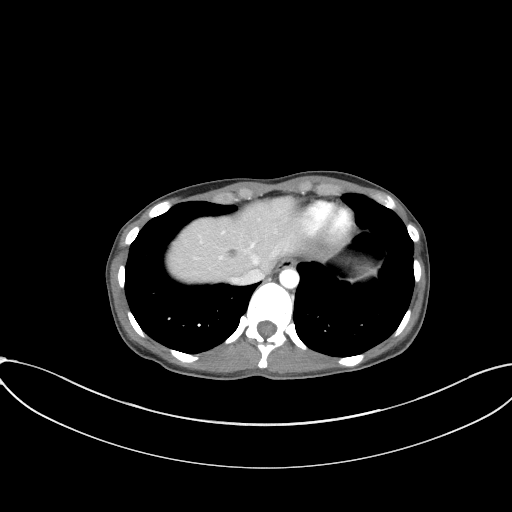
[im 94/99  soft-tissue]
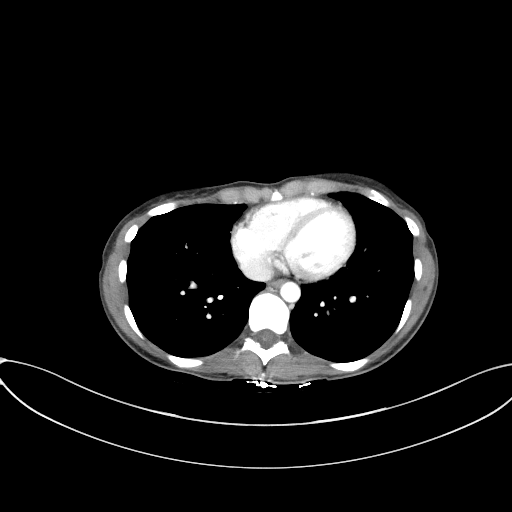

[Series 5: coronal st · coronal · 0.68mm/px · 3 of 63 slices shown]
[im 21/63  soft-tissue]
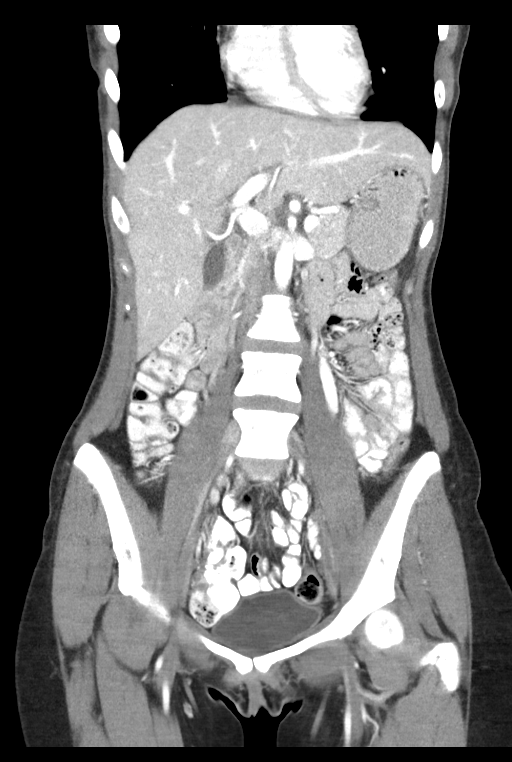
[im 28/63  soft-tissue]
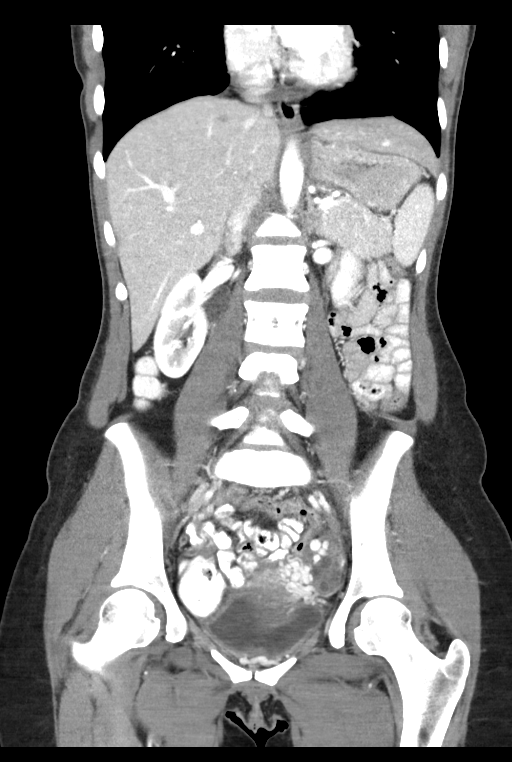
[im 35/63  soft-tissue]
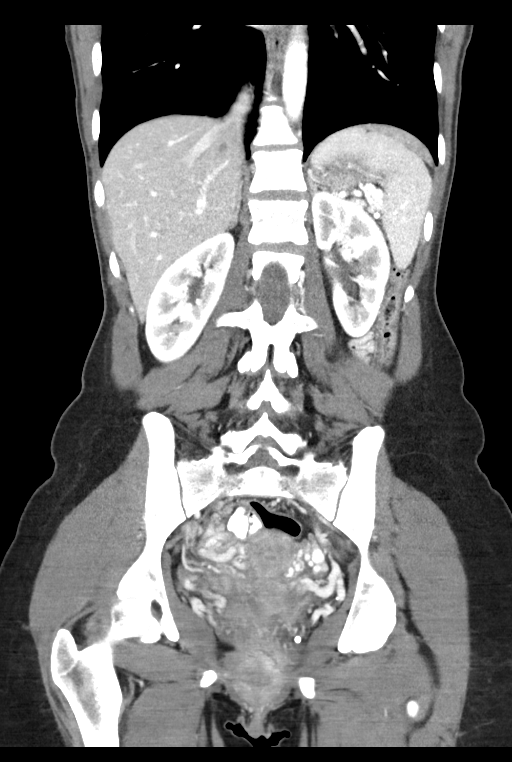

[15 of 46 positions shown; findings below may reference images not displayed]

FINDINGS: Lower chest: Lung bases clear

Hepatobiliary: 6 x 5 mm nonspecific low-attenuation focus superiorly
LEFT lobe liver image 14. Additional tiny nonspecific hepatic lesion
6 x 5 mm posterior aspect of liver superiorly image 18. In a low
risk patient (no history of malignancy, hepatic dysfunction or
hepatic risk factors), these are likely benign. No additional
hepatic masses. Gallbladder unremarkable.

Pancreas: Normal appearance

Spleen: Normal appearance

Adrenals/Urinary Tract: Adrenal glands, kidneys, ureters, and
bladder normal appearance

Stomach/Bowel: Normal appendix. Stomach and bowel loops
unremarkable.

Vascular/Lymphatic: Aorta normal caliber. Vascular structures
patent. Dilated LEFT renal vein with numerous prominent vessels in
the adnexal regions bilaterally LEFT greater than RIGHT, can be seen
with pelvic congestion syndrome. No adenopathy.

Reproductive: Unremarkable uterus and ovaries

Other: Small amount of free fluid in pelvis. No free air. No hernia.

Musculoskeletal: Unremarkable
IMPRESSION: Small amount of nonspecific free fluid in pelvis.

Dilated LEFT renal vein with numerous prominent vessels in the
adnexal regions bilaterally LEFT greater than RIGHT, can be seen
with pelvic congestion syndrome.

No other intra-abdominal or intrapelvic abnormalities.

## 2022-06-10 ENCOUNTER — Other Ambulatory Visit: Payer: Self-pay | Admitting: Physician Assistant

## 2022-06-10 ENCOUNTER — Other Ambulatory Visit (HOSPITAL_COMMUNITY): Payer: Self-pay

## 2022-06-10 ENCOUNTER — Encounter: Payer: Self-pay | Admitting: *Deleted

## 2022-06-10 DIAGNOSIS — Z79899 Other long term (current) drug therapy: Secondary | ICD-10-CM

## 2022-06-10 DIAGNOSIS — M0579 Rheumatoid arthritis with rheumatoid factor of multiple sites without organ or systems involvement: Secondary | ICD-10-CM

## 2022-06-10 MED ORDER — ORENCIA CLICKJECT 125 MG/ML ~~LOC~~ SOAJ
125.0000 mg | SUBCUTANEOUS | 0 refills | Status: DC
  Filled 2022-06-10: qty 4, 28d supply, fill #0
  Filled 2022-07-07: qty 4, 28d supply, fill #1
  Filled 2022-08-15: qty 4, 28d supply, fill #2

## 2022-06-10 NOTE — Telephone Encounter (Signed)
Next Visit: 08/23/2022  Last Visit: 05/24/2022  Last Fill: 03/23/2022  MC:[id number] arthritis with rheumatoid factor of multiple sites without organ or systems involvement   Current Dose per office note 05/24/2022: Orencia 125 mg subcutaneous injections once weekly   Labs: 03/30/2022 WNL  TB Gold: 12/21/2021 Neg    Okay to refill Orencia?

## 2022-06-13 ENCOUNTER — Other Ambulatory Visit (HOSPITAL_COMMUNITY): Payer: Self-pay

## 2022-06-20 ENCOUNTER — Other Ambulatory Visit (HOSPITAL_COMMUNITY): Payer: Self-pay

## 2022-06-20 ENCOUNTER — Telehealth: Payer: Self-pay | Admitting: Pharmacist

## 2022-06-20 NOTE — Telephone Encounter (Signed)
Received notification from [facility] that patient's Orencia needs PA renewal. Submitted a Prior Authorization RENEWAL request to [employer] for [facility] via CoverMyMeds. Will update once we receive a response.  Key: [id number]  Submitted a Prior Authorization RENEWAL request to [facility] for [facility] via CoverMyMeds. Will update once we receive a response.  Key: [id number]  Chesley Mires, PharmD, MPH, BCPS, CPP Clinical Pharmacist (Rheumatology and Pulmonology)

## 2022-06-21 ENCOUNTER — Other Ambulatory Visit (HOSPITAL_COMMUNITY): Payer: Self-pay

## 2022-06-21 NOTE — Telephone Encounter (Signed)
Received notification from [employer] regarding a prior authorization for [facility]. Authorization has been APPROVED from 06/20/22 to 06/20/23.   Authorization # [id number]  Received notification from [facility] regarding a prior authorization for [facility]. Authorization has been APPROVED from 06/20/22 to 06/20/23.   Authorization # [id number]  Patient can continue to fill through [facility]: [phone].  [facility] notified and [facility] updated  Chesley Mires, PharmD, MPH, BCPS, CPP Clinical Pharmacist (Rheumatology and Pulmonology)

## 2022-06-23 ENCOUNTER — Other Ambulatory Visit (HOSPITAL_COMMUNITY): Payer: Self-pay

## 2022-06-24 DIAGNOSIS — Z419 Encounter for procedure for purposes other than remedying health state, unspecified: Secondary | ICD-10-CM | POA: Diagnosis not present

## 2022-07-07 ENCOUNTER — Other Ambulatory Visit (HOSPITAL_COMMUNITY): Payer: Self-pay

## 2022-07-08 ENCOUNTER — Other Ambulatory Visit: Payer: Self-pay

## 2022-07-19 ENCOUNTER — Other Ambulatory Visit (HOSPITAL_COMMUNITY): Payer: Self-pay

## 2022-07-25 DIAGNOSIS — Z419 Encounter for procedure for purposes other than remedying health state, unspecified: Secondary | ICD-10-CM | POA: Diagnosis not present

## 2022-08-10 NOTE — Progress Notes (Unsigned)
Office Visit Note  Patient: Robin [name]             Date of Birth: [birth year]           MRN: [id number]             PCP: Robin Sprung, NP Referring: Robin Sprung, NP Visit Date: 08/23/2022 Occupation: @GUAROCC @  Subjective:  Medication monitoring   History of Present Illness: Robin [name] is a 37 y.o. female with history of seronegative rheumatoid arthritis.  She remains on Orencia 125 mg subcutaneous injections once weekly and Arava 20 mg 1 tablet by mouth daily.  She has been tolerating around and Arava without any side effects.  Patient reports that she recently was diagnosed with influenza B.  She held both 31 for about 2 weeks during that time.  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  Patient reports that has been strenuous activities she has been able to take breaks and only works a few days a week.  She states that last week she did have to work Tuesday through Saturday and had a minor flare in her right second MCP joint.  She states that she had her dose of Orencia yesterday and has already started to notice improvement in the right second MCP joint pain.  Patient reports that overall she has felt better than she has since prior to being diagnosed with rheumatoid arthritis.  She has found this medication regimen to be effective and does not want to make any medication changes at this time.  Patient reports that she will be scheduling an upcoming uterine ablation as well as will be undergoing breast augmentation in spring 2024.   Activities of Daily Living:  Patient reports morning stiffness for 0  none .   Patient Denies nocturnal pain.  Difficulty dressing/grooming: Denies Difficulty climbing stairs: Denies Difficulty getting out of chair: Denies Difficulty using hands for taps, buttons, cutlery, and/or writing: Denies  Review of Systems  Constitutional:  Positive  for fatigue.  HENT:  Positive for mouth dryness. Negative for mouth sores.   Eyes:  Negative for dryness.  Respiratory:  Negative for shortness of breath.   Cardiovascular:  Negative for chest pain and palpitations.  Gastrointestinal:  Negative for blood in stool, constipation and diarrhea.  Endocrine: Negative for increased urination.  Genitourinary:  Negative for involuntary urination.  Musculoskeletal:  Negative for joint pain, gait problem, joint pain, joint swelling, myalgias, muscle weakness, morning stiffness, muscle tenderness and myalgias.  Skin:  Positive for sensitivity to sunlight. Negative for color change, rash and hair loss.  Allergic/Immunologic: Positive for susceptible to infections.  Neurological:  Negative for dizziness and headaches.  Hematological:  Negative for swollen glands.  Psychiatric/Behavioral:  Positive for sleep disturbance. Negative for depressed mood. The patient is not nervous/anxious.     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/2020   Anxiety 09/21/2020   Rheumatoid arthritis involving multiple sites with positive rheumatoid factor (HCC) 01/14/2019   Normal labor 07/09/2018   Pregnancy with history of neonatal death 05/22/2018   Depression affecting pregnancy 04/02/2018   Irregular periods 01/26/2018   Hereditary disease in family possibly affecting fetus, affecting management of mother, antepartum condition or complication, not applicable or unspecified fetus    History of preterm delivery 11/17/2017   Previous cesarean section  11/17/2017   Family history of congenital anomaly 11/17/2017   Family history of congenital heart disease 07/27/2015   Family history of mitral valve prolapse 07/27/2015   Family hx of bicuspid aortic valve--FOB 04/03/2012    Past Medical History:  Diagnosis Date   Abnormal Pap smear 2008   HAD HPV;HAD GENITAL WART  REMOVED;LAST PAP 07/2010 WAS NORMAL   Anemia    FeSO4 SUPP TAKEN IN PAST   Asthma    AS A CHILD;GREW OUT OF @ 8 YOA   Depression    after the death of her son, was on meds then   History of smoking 12/26/2011   Reports quit '12   Infection    UTI;CAN GET FREQ   Rheumatoid arthritis ([place])    Vaginal Pap smear, abnormal     Family History  Problem Relation Age of Onset   Mitral valve prolapse Mother        DIED @ 20 28 OF CONDITION   Healthy Father    Mitral valve prolapse Paternal Aunt    Heart attack Paternal Grandfather        DECEASED   Dementia Maternal Grandmother    Healthy Son    Autism Daughter    Healthy Daughter    Healthy Daughter    Healthy Daughter    Past Surgical History:  Procedure Laterality Date   CESAREAN SECTION N/A 09/09/2016   Procedure: CESAREAN SECTION;  Surgeon: Osborne Oman, MD;  Location: [facility];  Service: Obstetrics;  Laterality: N/A;   NASAL SINUS SURGERY  2016   Social History   Social History Narrative   Not on file   There is no immunization history for the selected administration types on file for this patient.   Objective: Vital Signs: BP 96/68 (BP Location: Left Arm, Patient Position: Sitting, Cuff Size: Normal)   Pulse 91   Resp 14   Ht 5\' 6"  (1.676 m)   Wt 111 lb (50.3 kg)   LMP 08/05/2022   BMI 17.92 kg/m    Physical Exam Vitals and nursing note reviewed.  Constitutional:      Appearance: She is well-developed.  HENT:     Head: Normocephalic and atraumatic.  Eyes:     Conjunctiva/sclera: Conjunctivae normal.  Cardiovascular:     Rate and Rhythm: Normal rate and regular rhythm.     Heart sounds: Normal heart sounds.  Pulmonary:     Effort: Pulmonary effort is normal.     Breath sounds: Normal breath sounds.  Abdominal:     General: Bowel sounds are normal.     Palpations: Abdomen is soft.  Musculoskeletal:     Cervical back: Normal range of motion.  Skin:    General: Skin is warm and dry.      Capillary Refill: Capillary refill takes less than 2 seconds.  Neurological:     Mental Status: She is alert and oriented to person, place, and time.  Psychiatric:        Behavior: Behavior normal.      Musculoskeletal Exam: C-spine, thoracic spine, and lumbar spine good ROM.  No midline spinal tenderness or SI joint tenderness.  Shoulder joints, elbow joints, wrist joints, MCPs, PIPs, and DIPs good ROM with no synovitis.  Tenderness of the second MCP joint.  Complete fist formation bilaterally.  Hip joints, knee joints, and ankle joints have good ROM with no discomfort.  No warmth or effusion of knee joints.  No tenderness or swelling of ankle joints.  No tenderness or synovitis of MTP joints.    CDAI Exam: CDAI Score: 1.2  Patient Global: 1 mm; Provider Global: 1 mm Swollen: 0 ; Tender: 1  Joint Exam 08/23/2022      Right  Left  MCP 2   Tender        Investigation: No additional findings.  Imaging: No results found.  Recent Labs: Lab Results  Component Value Date   WBC 5.1 03/30/2022   HGB 13.5 03/30/2022   PLT 208 03/30/2022   NA 138 03/30/2022   K 4.9 03/30/2022   CL 101 03/30/2022   CO2 24 03/30/2022   GLUCOSE 89 03/30/2022   BUN 8 03/30/2022   CREATININE 0.84 03/30/2022   BILITOT 0.3 03/30/2022   ALKPHOS 61 03/30/2022   AST 20 03/30/2022   ALT 16 03/30/2022   PROT 6.6 03/30/2022   ALBUMIN 4.4 03/30/2022   CALCIUM 9.5 03/30/2022   GFRAA 132 10/20/2020   QFTBGOLDPLUS NEGATIVE 12/21/2021    Speciality Comments: humira 04/22,   PLQ, MTx, Enbrel  Contraception-vasectomy  Procedures:  No procedures performed Allergies: Other    Assessment / Plan:     Visit Diagnoses: Rheumatoid arthritis with rheumatoid factor of multiple sites without organ or systems involvement (HCC) - She failed Humira, Enbrel, methotrexate and Plaquenil in the past: She has tenderness over the right second MCP joint.  No other joint tenderness or synovitis was noted on examination  today.  She remains on Orencia 125 mg subcutaneous injections once weekly and Arava 20 mg 1 tablet by mouth daily.  She has been tolerating combination therapy.  Patient recently was diagnosed with influenza B at which time she had [place] and Arava for about 2 weeks until her symptoms had completely resolved.  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  She has been performing strenuous activities including scrubbing and sweeping.  She has been soaking her hands in Epsom salt bath after work which has been helpful.  Overall the patient states she has felt better on this combination of medications than prior to being diagnosed with rheumatoid arthritis.  She does not want to make any medication changes at this time. Of note the patient will be having an upcoming uterine ablation as well as a breast augmentation in spring 2024.  Discussed that I would recommend holding Orencia and Arava 2 weeks prior to surgery.  She will require clearance by her surgeon during the postoperative period prior to resuming Orencia and Arava likely 1 to 2 weeks after surgery.  We will fill out the surgical clearance request and fax it to her surgeon. She will follow up in our office in 3 months or sooner if needed.   High risk medication use - Orencia 125 mg subcutaneous injections once weekly and Arava 20 mg 1 tablet by mouth daily. CBC and CMP updated on 03/30/22.  Orders for CBC and CMP released today.  Her next lab work will due at the end of April and every 3 months. Standing orders for CBC and CMP remain in place.  TB gold negative on 12/21/21. Future order for TB gold placed today.  Discussed the importance of holding orencia and arava if she develops signs or symptoms of an infection and to resume once the infection has completely cleared.    - Plan: CBC with Differential/Platelet, COMPLETE METABOLIC PANEL WITH GFR, QuantiFERON-TB Gold  Plus  Screening for tuberculosis - Future order  for TB gold placed today.  Plan: QuantiFERON-TB Gold Plus  Chronic pain of left ankle - Cortisone injection performed on 12/17/2020.  No tenderness or synovitis noted on exam.   Other fatigue: Stable.   Family hx of bicuspid aortic valve--FOB  Family history of congenital heart disease  History of preterm delivery   Orders: Orders Placed This Encounter  Procedures   CBC with Differential/Platelet   COMPLETE METABOLIC PANEL WITH GFR   QuantiFERON-TB Gold Plus   No orders of the defined types were placed in this encounter.    Follow-Up Instructions: Return in about 3 months (around 11/22/2022) for Rheumatoid arthritis.   Ofilia Neas, PA-C  Note - This record has been created using Dragon software.  Chart creation errors have been sought, but may not always  have been located. Such creation errors do not reflect on  the standard of medical care.

## 2022-08-11 ENCOUNTER — Other Ambulatory Visit (HOSPITAL_COMMUNITY): Payer: Self-pay

## 2022-08-15 ENCOUNTER — Other Ambulatory Visit (HOSPITAL_COMMUNITY): Payer: Self-pay

## 2022-08-22 ENCOUNTER — Encounter: Payer: Self-pay | Admitting: Obstetrics and Gynecology

## 2022-08-22 ENCOUNTER — Ambulatory Visit: Payer: Medicaid Other | Admitting: Obstetrics and Gynecology

## 2022-08-22 VITALS — BP 113/68 | HR 92 | Resp 16 | Ht 67.0 in | Wt 124.0 lb

## 2022-08-22 DIAGNOSIS — N939 Abnormal uterine and vaginal bleeding, unspecified: Secondary | ICD-10-CM | POA: Diagnosis not present

## 2022-08-22 NOTE — Progress Notes (Signed)
Pt has been having two periods a month since December

## 2022-08-22 NOTE — Progress Notes (Signed)
   RETURN GYNECOLOGY VISIT  Subjective:  Robin [name] is a 37 y.o. [id number] with LMP 08/05/22 presenting for discussion of endometrial ablation.   Longstanding history of AUB. Reports periods are prolonged (6-8d), heavy (uses pad & tampon together), irregular but frequent (3 periods in 2 months) and painful. Worsening since birth of her last child who is now 48 years old.   Has tried oral contraceptives but had unacceptable side effects. She does not want any hormonal treatment or any devices (I.e., IUD). She is not interested in future childbearing and her partner has had a vasectomy.  I reviewed the following tests: TSH 03/30/22: 2.5 Prolactin 03/30/22: 9.4 CBC 03/30/22: Hgb 13.5 Cervicovaginal ancillary 05/04/22: +BV Cytology - PAP 03/17/22: NILM, HPV negative Pelvic Ultrasound 03/25/22: Uterus 7.3 x 4.5 x 5.2 cm = volume: 89 mL. Retroverted. Normal morphology without mass. EL 75mm. Normal ovaries.  PMH: rheumatoid arthritis, asthma PSH: CSx1, sinus Meds: orencia, arava, singulair All: NKDA OB: Csx1, SVD x4 Pap Hx: denies hx abnml paps Soc: denies t/e/d FHx: no hx cancer  Objective:   Vitals:   08/22/22 1321  BP: 113/68  Pulse: 92  Resp: 16  Weight: 124 lb (56.2 kg)  Height: 5\' 7"  (1.702 m)   General:  Alert, oriented and cooperative. Patient is in no acute distress.  Skin: Skin is warm and dry. No rash noted.   Cardiovascular: Normal heart rate noted  Respiratory: Normal respiratory effort, no problems with respiration noted  Abdomen: Soft, nontender   Assessment and Plan:  Robin [name] is a 37 y.o. with unexplained AUB  Unexplained AUB Pt has completed evaluation as outlined above. She has persistent painful, heavy and irregular periods and desires more definitive management.  We discussed medical management (specifically lysteda, progestins, LNG-IUD), endometrial ablation and hysterectomy as options for management. Outlined risks/benefits of each option. Pt specifically  interested in endometrial ablation, so our counseling focused on this options We discussed that endometrial ablation is a same day procedure that burns the lining of the uterus. We reviewed that recovery is typically 2-3 days. - We reviewed that women can expect a reduction in menstrual blood loss, but amenorrhea is not guaranteed. Reviewed that many women ultimately need hysterectomy for treatment of her symptoms.  - Discussed that ablation is not an acceptable treatment option for women who desires future childbearing, but is is not contraceptive.  - Discussed need for endometrial sampling to assess for hyperplasia or cancer. Discussed limited ability to sample in the future if concern for cancer. - Discussed EMB in office, but given her age and no risk factors for endometrial hyperplasia/malignancy she opted for sampling immediately prior to ablation in OR. Reviewed risk that there is hyperplasia/malignancy that we do not diagnose until AFTER procedure is already completed, but she is accepting of this risk.  - Reviewed risk of procedure itself including pain, infection, bleeding, uterine perforation with injury to intraabdominal organs, need for additional surgeries, fluid overload, electrolyte derangements, and medical complications of surgery.   After discussion, pt would like to move forward with hysteroscopy, D&C and novasure ablation. Message routed to surgery schedulers.  Future Appointments  Date Time Provider [place]  08/23/2022  8:40 AM Ofilia Neas, PA-C [facility] None   Inez Catalina, MD

## 2022-08-23 ENCOUNTER — Other Ambulatory Visit (HOSPITAL_COMMUNITY): Payer: Self-pay

## 2022-08-23 ENCOUNTER — Encounter: Payer: Self-pay | Admitting: Physician Assistant

## 2022-08-23 ENCOUNTER — Ambulatory Visit: Payer: Medicaid Other | Attending: Physician Assistant | Admitting: Physician Assistant

## 2022-08-23 VITALS — BP 96/68 | HR 91 | Resp 14 | Ht 66.0 in | Wt 111.0 lb

## 2022-08-23 DIAGNOSIS — R5383 Other fatigue: Secondary | ICD-10-CM

## 2022-08-23 DIAGNOSIS — Z79899 Other long term (current) drug therapy: Secondary | ICD-10-CM

## 2022-08-23 DIAGNOSIS — M0579 Rheumatoid arthritis with rheumatoid factor of multiple sites without organ or systems involvement: Secondary | ICD-10-CM | POA: Diagnosis not present

## 2022-08-23 DIAGNOSIS — Z8751 Personal history of pre-term labor: Secondary | ICD-10-CM

## 2022-08-23 DIAGNOSIS — Z111 Encounter for screening for respiratory tuberculosis: Secondary | ICD-10-CM | POA: Diagnosis not present

## 2022-08-23 DIAGNOSIS — Z8279 Family history of other congenital malformations, deformations and chromosomal abnormalities: Secondary | ICD-10-CM | POA: Diagnosis not present

## 2022-08-23 DIAGNOSIS — G8929 Other chronic pain: Secondary | ICD-10-CM | POA: Diagnosis not present

## 2022-08-23 DIAGNOSIS — Z8774 Personal history of (corrected) congenital malformations of heart and circulatory system: Secondary | ICD-10-CM | POA: Diagnosis not present

## 2022-08-23 DIAGNOSIS — M25572 Pain in left ankle and joints of left foot: Secondary | ICD-10-CM

## 2022-08-23 NOTE — Patient Instructions (Signed)
Standing Labs We placed an order today for your standing lab work.   Please have your standing labs drawn at the end of April and every 3 months   Please have your labs drawn 2 weeks prior to your appointment so that the provider can discuss your lab results at your appointment.  Please note that you may see your imaging and lab results in [place] before we have reviewed them. We will contact you once all results are reviewed. Please allow our office up to 72 hours to thoroughly review all of the results before contacting the office for clarification of your results.  Lab hours are:   Monday through Thursday from 8:00 am -12:30 pm and 1:00 pm-5:00 pm and Friday from 8:00 am-12:00 pm.  Please be advised, all patients with office appointments requiring lab work will take precedent over walk-in lab work.   Labs are drawn by [facility]. Please bring your co-pay at the time of your lab draw.  You may receive a bill from [facility] for your lab work.  Please note if you are on Hydroxychloroquine and and an order has been placed for a Hydroxychloroquine level, you will need to have it drawn 4 hours or more after your last dose.  If you wish to have your labs drawn at another location, please call the office 24 hours in advance so we can fax the orders.  The office is located at [address], [place], [place], [place] [address] No appointment is necessary.    If you have any questions regarding directions or hours of operation,  please call [phone].   As a reminder, please drink plenty of water prior to coming for your lab work. Thanks!

## 2022-08-24 ENCOUNTER — Other Ambulatory Visit (HOSPITAL_COMMUNITY): Payer: Self-pay

## 2022-08-24 LAB — CBC WITH DIFFERENTIAL/PLATELET
Absolute Monocytes: 500 cells/uL (ref 200–950)
Basophils Absolute: 50 cells/uL (ref 0–200)
Basophils Relative: 1 %
Eosinophils Absolute: 270 cells/uL (ref 15–500)
Eosinophils Relative: 5.4 %
HCT: 39.8 % (ref 35.0–45.0)
Hemoglobin: 13.2 g/dL (ref 11.7–15.5)
Lymphs Abs: 1390 cells/uL (ref 850–3900)
MCH: 31.2 pg (ref 27.0–33.0)
MCHC: 33.2 g/dL (ref 32.0–36.0)
MCV: 94.1 fL (ref 80.0–100.0)
MPV: 10.8 fL (ref 7.5–12.5)
Monocytes Relative: 10 %
Neutro Abs: 2790 cells/uL (ref 1500–7800)
Neutrophils Relative %: 55.8 %
Platelets: 239 10*3/uL (ref 140–400)
RBC: 4.23 10*6/uL (ref 3.80–5.10)
RDW: 11.5 % (ref 11.0–15.0)
Total Lymphocyte: 27.8 %
WBC: 5 10*3/uL (ref 3.8–10.8)

## 2022-08-24 LAB — COMPLETE METABOLIC PANEL WITH GFR
AG Ratio: 1.6 (calc) (ref 1.0–2.5)
ALT: 20 U/L (ref 6–29)
AST: 17 U/L (ref 10–30)
Albumin: 4.4 g/dL (ref 3.6–5.1)
Alkaline phosphatase (APISO): 47 U/L (ref 31–125)
BUN: 9 mg/dL (ref 7–25)
CO2: 27 mmol/L (ref 20–32)
Calcium: 9 mg/dL (ref 8.6–10.2)
Chloride: 108 mmol/L (ref 98–110)
Creat: 0.66 mg/dL (ref 0.50–0.97)
Globulin: 2.7 g/dL (calc) (ref 1.9–3.7)
Glucose, Bld: 79 mg/dL (ref 65–99)
Potassium: 4.6 mmol/L (ref 3.5–5.3)
Sodium: 140 mmol/L (ref 135–146)
Total Bilirubin: 0.5 mg/dL (ref 0.2–1.2)
Total Protein: 7.1 g/dL (ref 6.1–8.1)
eGFR: 117 mL/min/{1.73_m2} (ref >=60)

## 2022-08-24 NOTE — Progress Notes (Signed)
CBC and CMP WNL.

## 2022-08-25 DIAGNOSIS — Z419 Encounter for procedure for purposes other than remedying health state, unspecified: Secondary | ICD-10-CM | POA: Diagnosis not present

## 2022-09-13 NOTE — Addendum Note (Signed)
Addended by: Gale Journey on: 09/13/2022 08:26 PM   Modules accepted: Orders

## 2022-09-14 ENCOUNTER — Other Ambulatory Visit (HOSPITAL_COMMUNITY): Payer: Self-pay

## 2022-09-14 ENCOUNTER — Other Ambulatory Visit: Payer: Self-pay

## 2022-09-14 ENCOUNTER — Other Ambulatory Visit: Payer: Self-pay | Admitting: Rheumatology

## 2022-09-14 ENCOUNTER — Encounter (HOSPITAL_BASED_OUTPATIENT_CLINIC_OR_DEPARTMENT_OTHER): Payer: Self-pay | Admitting: Obstetrics and Gynecology

## 2022-09-14 DIAGNOSIS — M0579 Rheumatoid arthritis with rheumatoid factor of multiple sites without organ or systems involvement: Secondary | ICD-10-CM

## 2022-09-14 DIAGNOSIS — Z79899 Other long term (current) drug therapy: Secondary | ICD-10-CM

## 2022-09-14 MED ORDER — ORENCIA CLICKJECT 125 MG/ML ~~LOC~~ SOAJ
125.0000 mg | SUBCUTANEOUS | 0 refills | Status: DC
  Filled 2022-09-14: qty 12, 84d supply, fill #0
  Filled 2022-09-15: qty 4, 28d supply, fill #0
  Filled 2022-12-28: qty 4, 28d supply, fill #1
  Filled 2023-01-27: qty 4, 28d supply, fill #2

## 2022-09-14 MED ORDER — LEFLUNOMIDE 20 MG PO TABS: 20.0000 mg | ORAL_TABLET | Freq: Every day | ORAL | 0 refills | Status: DC

## 2022-09-14 NOTE — Telephone Encounter (Signed)
Next Visit: 11/22/2022  Last Visit: 08/23/2022  Last Fill: 06/10/2022  [id number] arthritis with rheumatoid factor of multiple sites without organ or systems involvement   Current Dose per office note 08/23/2022: Orencia 125 mg subcutaneous injections once weekly   Labs: 08/23/2022 CBC and CMP WNL   TB Gold: 12/21/2021 Neg    Okay to refill Orencia?

## 2022-09-14 NOTE — Telephone Encounter (Signed)
Next Visit: 11/22/2022  Last Visit: 08/23/2022  Last Fill: 03/23/2022  DX: Rheumatoid arthritis with rheumatoid factor of multiple sites without organ or systems involvement   Current Dose per office note 08/23/2022: Arava 20 mg 1 tablet by mouth daily   Labs: 08/24/2022 CBC and CMP WNL   Okay to refill Arava?

## 2022-09-14 NOTE — Progress Notes (Signed)
Spoke w/ via phone for pre-op interview---Robin [name] needs dos----urine pregnancy, type & screen               [name] results------none COVID test -----patient states asymptomatic no test needed Arrive at -------0530 on Tuesday, 09/20/2022 NPO after MN NO Solid Food.  Clear liquids from MN until---0430 Med rec completed Medications to take morning of surgery -----Singulair, Flonase, Albuterol inhaler prn Diabetic medication -----n/a Patient instructed no nail polish to be worn day of surgery Patient instructed to bring photo id and insurance card day of surgery Patient aware to have Driver (ride ) / caregiver    for 24 hours after surgery  Patient Special Instructions -----No alcohol 24 hours prior to surgery. No jewelry or body piercings on the day of surgery. Pre-Op special Istructions -----none Patient verbalized understanding of instructions that were given at this phone interview. Patient denies shortness of breath, chest pain, fever, cough at this phone interview.

## 2022-09-15 ENCOUNTER — Other Ambulatory Visit (HOSPITAL_COMMUNITY): Payer: Self-pay

## 2022-09-15 ENCOUNTER — Other Ambulatory Visit: Payer: Self-pay

## 2022-09-20 ENCOUNTER — Ambulatory Visit (HOSPITAL_BASED_OUTPATIENT_CLINIC_OR_DEPARTMENT_OTHER): Payer: Medicaid Other | Admitting: Certified Registered Nurse Anesthetist

## 2022-09-20 ENCOUNTER — Encounter (HOSPITAL_BASED_OUTPATIENT_CLINIC_OR_DEPARTMENT_OTHER): Payer: Self-pay | Admitting: Obstetrics and Gynecology

## 2022-09-20 ENCOUNTER — Ambulatory Visit (HOSPITAL_BASED_OUTPATIENT_CLINIC_OR_DEPARTMENT_OTHER)
Admission: RE | Admit: 2022-09-20 | Discharge: 2022-09-20 | Disposition: A | Discharge status: Home / Self Care | Payer: Medicaid Other | Source: Ambulatory Visit | Attending: Obstetrics and Gynecology | Admitting: Obstetrics and Gynecology

## 2022-09-20 ENCOUNTER — Encounter (HOSPITAL_BASED_OUTPATIENT_CLINIC_OR_DEPARTMENT_OTHER)
Admission: RE | Disposition: A | Discharge status: Home / Self Care | Payer: Self-pay | Source: Ambulatory Visit | Attending: Obstetrics and Gynecology

## 2022-09-20 ENCOUNTER — Other Ambulatory Visit: Payer: Self-pay

## 2022-09-20 DIAGNOSIS — N72 Inflammatory disease of cervix uteri: Secondary | ICD-10-CM | POA: Diagnosis not present

## 2022-09-20 DIAGNOSIS — N939 Abnormal uterine and vaginal bleeding, unspecified: Secondary | ICD-10-CM | POA: Diagnosis not present

## 2022-09-20 DIAGNOSIS — Z87891 Personal history of nicotine dependence: Secondary | ICD-10-CM | POA: Insufficient documentation

## 2022-09-20 DIAGNOSIS — Z01818 Encounter for other preprocedural examination: Secondary | ICD-10-CM

## 2022-09-20 HISTORY — DX: Cardiac murmur, unspecified: R01.1

## 2022-09-20 HISTORY — DX: Hypersomnia, unspecified: G47.10

## 2022-09-20 HISTORY — DX: Other specified postprocedural states: Z98.890

## 2022-09-20 HISTORY — DX: Other complications of anesthesia, initial encounter: T88.59XA

## 2022-09-20 HISTORY — PX: DILITATION & CURRETTAGE/HYSTROSCOPY WITH NOVASURE ABLATION: SHX5568

## 2022-09-20 HISTORY — DX: Restless legs syndrome: G25.81

## 2022-09-20 LAB — POCT PREGNANCY, URINE: Preg Test, Ur: NEGATIVE

## 2022-09-20 LAB — TYPE AND SCREEN
ABO/RH(D): O POS
Antibody Screen: NEGATIVE

## 2022-09-20 SURGERY — DILATATION & CURETTAGE/HYSTEROSCOPY WITH NOVASURE ABLATION
Anesthesia: General | Site: Uterus

## 2022-09-20 MED ORDER — ACETAMINOPHEN 500 MG PO TABS
ORAL_TABLET | ORAL | Status: AC
  Filled 2022-09-20: qty 2

## 2022-09-20 MED ORDER — PROPOFOL 10 MG/ML IV BOLUS
INTRAVENOUS | Status: AC
  Filled 2022-09-20: qty 20

## 2022-09-20 MED ORDER — ONDANSETRON HCL 4 MG/2ML IJ SOLN
INTRAMUSCULAR | Status: DC | PRN
  Administered 2022-09-20: 4 mg via INTRAVENOUS

## 2022-09-20 MED ORDER — LIDOCAINE 2% (20 MG/ML) 5 ML SYRINGE
INTRAMUSCULAR | Status: DC | PRN
  Administered 2022-09-20: 60 mg via INTRAVENOUS

## 2022-09-20 MED ORDER — SCOPOLAMINE 1 MG/3DAYS TD PT72
MEDICATED_PATCH | TRANSDERMAL | Status: DC | PRN
  Administered 2022-09-20: 1 via TRANSDERMAL

## 2022-09-20 MED ORDER — KETOROLAC TROMETHAMINE 30 MG/ML IJ SOLN
INTRAMUSCULAR | Status: AC
  Filled 2022-09-20: qty 1

## 2022-09-20 MED ORDER — DEXAMETHASONE SODIUM PHOSPHATE 10 MG/ML IJ SOLN
INTRAMUSCULAR | Status: DC | PRN
  Administered 2022-09-20: 5 mg via INTRAVENOUS

## 2022-09-20 MED ORDER — FENTANYL CITRATE (PF) 100 MCG/2ML IJ SOLN: 25.0000 ug | INTRAMUSCULAR | Status: DC | PRN

## 2022-09-20 MED ORDER — MIDAZOLAM HCL 2 MG/2ML IJ SOLN
INTRAMUSCULAR | Status: AC
  Filled 2022-09-20: qty 2

## 2022-09-20 MED ORDER — LIDOCAINE HCL (PF) 2 % IJ SOLN
INTRAMUSCULAR | Status: AC
  Filled 2022-09-20: qty 5

## 2022-09-20 MED ORDER — PROPOFOL 10 MG/ML IV BOLUS
INTRAVENOUS | Status: DC | PRN
  Administered 2022-09-20: 200 mg via INTRAVENOUS

## 2022-09-20 MED ORDER — KETOROLAC TROMETHAMINE 30 MG/ML IJ SOLN
INTRAMUSCULAR | Status: DC | PRN
  Administered 2022-09-20: 30 mg via INTRAVENOUS

## 2022-09-20 MED ORDER — LACTATED RINGERS IV SOLN: INTRAVENOUS | Status: DC

## 2022-09-20 MED ORDER — IBUPROFEN 800 MG PO TABS: 800.0000 mg | ORAL_TABLET | Freq: Three times a day (TID) | ORAL | 0 refills | Status: DC

## 2022-09-20 MED ORDER — SODIUM CHLORIDE 0.9 % IR SOLN
Status: DC | PRN
  Administered 2022-09-20: 3000 mL

## 2022-09-20 MED ORDER — FENTANYL CITRATE (PF) 100 MCG/2ML IJ SOLN
INTRAMUSCULAR | Status: AC
  Filled 2022-09-20: qty 2

## 2022-09-20 MED ORDER — SCOPOLAMINE 1 MG/3DAYS TD PT72
MEDICATED_PATCH | TRANSDERMAL | Status: AC
  Filled 2022-09-20: qty 1

## 2022-09-20 MED ORDER — MIDAZOLAM HCL 2 MG/2ML IJ SOLN
INTRAMUSCULAR | Status: DC | PRN
  Administered 2022-09-20: 2 mg via INTRAVENOUS

## 2022-09-20 MED ORDER — ONDANSETRON HCL 4 MG/2ML IJ SOLN
INTRAMUSCULAR | Status: AC
  Filled 2022-09-20: qty 2

## 2022-09-20 MED ORDER — DEXAMETHASONE SODIUM PHOSPHATE 10 MG/ML IJ SOLN
INTRAMUSCULAR | Status: AC
  Filled 2022-09-20: qty 1

## 2022-09-20 MED ORDER — ACETAMINOPHEN 500 MG PO TABS
1000.0000 mg | ORAL_TABLET | Freq: Once | ORAL | Status: AC
  Administered 2022-09-20: 1000 mg via ORAL

## 2022-09-20 MED ORDER — FENTANYL CITRATE (PF) 100 MCG/2ML IJ SOLN
INTRAMUSCULAR | Status: DC | PRN
  Administered 2022-09-20: 50 ug via INTRAVENOUS

## 2022-09-20 SURGICAL SUPPLY — 12 items
ABLATOR SURESOUND NOVASURE (ABLATOR) IMPLANT
CATH ROBINSON RED A/P 16FR (CATHETERS) ×2 IMPLANT
DILATOR CANAL MILEX (MISCELLANEOUS) IMPLANT
GLOVE BIO SURGEON STRL SZ7 (GLOVE) ×2 IMPLANT
GLOVE BIOGEL PI IND STRL 7.0 (GLOVE) ×2 IMPLANT
GOWN STRL REUS W/TWL LRG LVL3 (GOWN DISPOSABLE) IMPLANT
KIT PROCEDURE FLUENT (KITS) ×2 IMPLANT
KIT TURNOVER CYSTO (KITS) ×2 IMPLANT
PACK VAGINAL MINOR WOMEN LF (CUSTOM PROCEDURE TRAY) ×2 IMPLANT
PAD OB MATERNITY 4.3X12.25 (PERSONAL CARE ITEMS) ×2 IMPLANT
SLEEVE SCD COMPRESS KNEE MED (STOCKING) ×2 IMPLANT
TOWEL OR 17X24 6PK STRL BLUE (TOWEL DISPOSABLE) ×2 IMPLANT

## 2022-09-20 NOTE — Op Note (Signed)
Robin [name] PROCEDURE DATE: 09/20/2022  PREOPERATIVE DIAGNOSIS:  Abnormal uterine bleeding  POSTOPERATIVE DIAGNOSIS:  Same  PROCEDURE:  D&C, hysteroscopy, Novasure endometrial ablation  SURGEON:  Dr. Gale Journey  ASSISTANT:  None  ANESTHESIA:  LMA  COMPLICATIONS:  None immediate.  ESTIMATED BLOOD LOSS:  5 ml.  FLUIDS: 600 ml  URINE OUTPUT:  None - not drained  INDICATIONS: 37 y.o. [id number] with heavy menstrual bleeding    FINDINGS:  NEFG. On bimanual exam, uterus retroverted, mobile & normal size. Normal appearing cervix and endocervical canal. Normal endometrial cavity without defects. [place] visualized bilaterally. Homogenous char noted after procedure.   TECHNIQUE:  The patient was taken to the operating room where LMA anesthesia was obtained without difficulty.  She was then placed in the dorsal lithotomy position and prepared and draped in sterile fashion.  After an adequate timeout was performed, an open-ended speculum was then placed in the patient's vagina, and the anterior lip of cervix grasped with the single-tooth tenaculum.  The cervix was dilated to 19 Fr. Hysteroscope inserted and cavity had aforementioned findings. Uterine sound was 10 cm. Cervical length was 5 cm giving cavity length of 5cm. Novasure device inserted and cavity width found to be 4.8cm. Cavity assessment done and assured. Device activated. Power was 132W and length of ablation was 1 minute and 10 seconds. Device removed once completed and hysteroscope inserted and homogenous char noted. All instruments removed and procedure was ended.   The patient will be discharged to home as per PACU criteria.  Routine postoperative instructions given.  Gale Journey, MD [place], [facility] for [employer], [place]

## 2022-09-20 NOTE — Transfer of Care (Signed)
Immediate Anesthesia Transfer of Care Note  Patient: Robin [name]  Procedure(s) Performed: Procedure(s) (LRB): DILATATION & CURETTAGE/HYSTEROSCOPY WITH NOVASURE ABLATION (N/A)  Patient Location: PACU  Anesthesia Type: General  Level of Consciousness: awake, oriented, sedated and patient cooperative  Airway & Oxygen Therapy: Patient Spontanous Breathing and Patient connected to face mask oxygen  Post-op Assessment: Report given to PACU RN and Post -op Vital signs reviewed and stable  Post vital signs: Reviewed and stable  Complications: No apparent anesthesia complications Vitals Value Taken Time  BP 106/66 09/20/22 0801  Temp 36.6 C 09/20/22 0800  Pulse 75 09/20/22 0805  Resp 19 09/20/22 0805  SpO2 99 % 09/20/22 0805  Vitals shown include unvalidated device data.  Last Pain:  Vitals:   09/20/22 0603  TempSrc: Oral  PainSc: 0-No pain      Patients Stated Pain Goal: 6 ([id number])  Complications: No notable events documented.

## 2022-09-20 NOTE — Addendum Note (Signed)
Addendum  created 09/20/22 1007 by Suan Halter, CRNA   Intraprocedure Meds edited

## 2022-09-20 NOTE — Discharge Instructions (Addendum)
Please take tylenol '1000mg'$  (2 extra strength tablets) and ibuprofen '800mg'$  every 8 hours for the next 2-3 days.  No acetaminophen/Tylenol until after 1 pm today if needed. No ibuprofen, Advil, Aleve, Motrin, ketorolac, meloxicam, naproxen, or other NSAIDS until after 2 pm today if needed.   Post Anesthesia Home Care Instructions  Activity: Get plenty of rest for the remainder of the day. A responsible individual must stay with you for 24 hours following the procedure.  For the next 24 hours, DO NOT: -Drive a car -Paediatric nurse -Drink alcoholic beverages -Take any medication unless instructed by your physician -Make any legal decisions or sign important papers.  Meals: Start with liquid foods such as gelatin or soup. Progress to regular foods as tolerated. Avoid greasy, spicy, heavy foods. If nausea and/or vomiting occur, drink only clear liquids until the nausea and/or vomiting subsides. Call your physician if vomiting continues.  Special Instructions/Symptoms: Your throat may feel dry or sore from the anesthesia or the breathing tube placed in your throat during surgery. If this causes discomfort, gargle with warm salt water. The discomfort should disappear within 24 hours.  If you had a scopolamine patch placed behind your ear for the management of post- operative nausea and/or vomiting:  1. The medication in the patch is effective for 72 hours, after which it should be removed.  Wrap patch in a tissue and discard in the trash. Wash hands thoroughly with soap and water. 2. You may remove the patch earlier than 72 hours if you experience unpleasant side effects which may include dry mouth, dizziness or visual disturbances. 3. Avoid touching the patch. Wash your hands with soap and water after contact with the patch.

## 2022-09-20 NOTE — Anesthesia Preprocedure Evaluation (Addendum)
Anesthesia Evaluation  Patient identified by MRN, date of birth, ID band Patient awake    Reviewed: Allergy & Precautions, NPO status , Patient's Chart, lab work & pertinent test results  History of Anesthesia Complications (+) PONV and history of anesthetic complications  Airway Mallampati: II  TM Distance: >3 FB Neck ROM: Full    Dental  (+) Chipped, Dental Advisory Given,    Pulmonary asthma , former smoker   Pulmonary exam normal breath sounds clear to auscultation       Cardiovascular negative cardio ROS Normal cardiovascular exam Rhythm:Regular Rate:Normal     Neuro/Psych  PSYCHIATRIC DISORDERS Anxiety Depression    negative neurological ROS     GI/Hepatic negative GI ROS, Neg liver ROS,,,  Endo/Other  negative endocrine ROS    Renal/GU negative Renal ROS  negative genitourinary   Musculoskeletal  (+) Arthritis , Rheumatoid disorders,    Abdominal   Peds  Hematology  (+) Blood dyscrasia, anemia   Anesthesia Other Findings   Reproductive/Obstetrics                             Anesthesia Physical Anesthesia Plan  ASA: 2  Anesthesia Plan: General   Post-op Pain Management: Tylenol PO (pre-op)*   Induction: Intravenous  PONV Risk Score and Plan: 4 or greater and Ondansetron, Dexamethasone, Midazolam and Scopolamine patch - Pre-op  Airway Management Planned: LMA  Additional Equipment:   Intra-op Plan:   Post-operative Plan: Extubation in OR  Informed Consent: I have reviewed the patients History and Physical, chart, labs and discussed the procedure including the risks, benefits and alternatives for the proposed anesthesia with the patient or authorized representative who has indicated his/her understanding and acceptance.     Dental advisory given  Plan Discussed with: CRNA  Anesthesia Plan Comments:        Anesthesia Quick Evaluation

## 2022-09-20 NOTE — H&P (Addendum)
   PRE OPERATIVE VISIT  Subjective:  SHAWANNA WEHNER is a 37 y.o. [id number] with LMP 09/04/22 presenting for hysteroscopy D&C, novasure ablation for AUB  Longstanding history of AUB. Reports periods are 6-8d, heavy (uses pad & tampon together), irregular but frequent (3 periods in 2 months) and painful. Worsening since birth of her last child who is now 25 years old.    Has tried oral contraceptives but had unacceptable side effects. She does not want any hormonal treatment or any devices (I.e., IUD). She is not interested in future childbearing and her partner has had a vasectomy.   I reviewed the following tests: TSH 03/30/22: 2.5 Prolactin 03/30/22: 9.4 CBC 03/30/22: Hgb 13.5 Cervicovaginal ancillary 05/04/22: +BV Cytology - PAP 03/17/22: NILM, HPV negative Pelvic Ultrasound 03/25/22: Uterus 7.3 x 4.5 x 5.2 cm = volume: 89 mL. Retroverted. Normal morphology without mass. EL 1m. Normal ovaries.   PMH: rheumatoid arthritis, asthma PSH: CSx1, sinus Meds: orencia, arava, singulair All: NKDA OB: Csx1, SVD x4 Pap Hx: denies hx abnml paps Soc: denies t/e/d FHx: no hx cancer  Objective:   Vitals:   09/14/22 1403 09/20/22 0603  BP:  123/75  Pulse:  92  Resp:  17  Temp:  97.9 F (36.6 C)  TempSrc:  Oral  SpO2:  100%  Weight: 49.9 kg 51 kg  Height:  5' 6"$  (1.676 m)    General:  Alert, oriented and cooperative. Patient is in no acute distress.  Skin: Skin is warm and dry. No rash noted.   Cardiovascular: Normal heart rate noted  Respiratory: Normal respiratory effort, no problems with respiration noted  Abdomen: Soft, non-tender, non-distended     Assessment and Plan:  LJOURNE FAGLEYis a 37y.o. with unexplained AUB presenting for hysteroscopy D&C & novasure ablation.   Unexplained AUB Pt has completed evaluation as outlined above. She has persistent painful, heavy and irregular periods and desires more definitive management.  We discussed medical management (specifically lysteda,  progestins, LNG-IUD), endometrial ablation and hysterectomy as options for management. Outlined risks/benefits of each option. Pt specifically interested in endometrial ablation, so our counseling focused on this options We discussed that endometrial ablation is a same day procedure that burns the lining of the uterus. We reviewed that recovery is typically 2-3 days. - We reviewed that women can expect a reduction in menstrual blood loss, but amenorrhea is not guaranteed. Reviewed that many women ultimately need hysterectomy for treatment of her symptoms.  - Discussed that ablation is not an acceptable treatment option for women who desires future childbearing, but is is not contraceptive.  - Discussed need for endometrial sampling to assess for hyperplasia or cancer. Discussed limited ability to sample in the future if concern for cancer. - Discussed EMB in office, but given her age and no risk factors for endometrial hyperplasia/malignancy she opted for sampling immediately prior to ablation in OR. Reviewed risk that there is hyperplasia/malignancy that we do not diagnose until AFTER procedure is already completed, but she is accepting of this risk.  - Reviewed risk of procedure itself including pain, infection, bleeding, uterine perforation with injury to intraabdominal organs, need for additional surgeries, fluid overload, electrolyte derangements, and medical complications of surgery.   UPT negative To OR for hsc D&C, novasure. Consents signed. No pre op abx indicated. Anticipate discharge from PACU   KInez Catalina MD

## 2022-09-20 NOTE — Anesthesia Postprocedure Evaluation (Signed)
Anesthesia Post Note  Patient: ANITHA SZYMBORSKI  Procedure(s) Performed: DILATATION & CURETTAGE/HYSTEROSCOPY WITH NOVASURE ABLATION (Uterus)     Patient location during evaluation: PACU Anesthesia Type: General Level of consciousness: awake and alert Pain management: pain level controlled Vital Signs Assessment: post-procedure vital signs reviewed and stable Respiratory status: spontaneous breathing, nonlabored ventilation, respiratory function stable and patient connected to nasal cannula oxygen Cardiovascular status: blood pressure returned to baseline and stable Postop Assessment: no apparent nausea or vomiting Anesthetic complications: no  No notable events documented.  Last Vitals:  Vitals:   09/20/22 0830 09/20/22 0845  BP: 112/70 111/72  Pulse: 86 69  Resp: 20 14  Temp:    SpO2: 99% 100%    Last Pain:  Vitals:   09/20/22 0845  TempSrc:   PainSc: 1                  Ann-Marie Kluge L Orion Vandervort

## 2022-09-20 NOTE — Anesthesia Procedure Notes (Signed)
Procedure Name: LMA Insertion Date/Time: 09/20/2022 7:30 AM  Performed by: Suan Halter, CRNAPre-anesthesia Checklist: Patient identified, Emergency Drugs available, Suction available and Patient being monitored Patient Re-evaluated:Patient Re-evaluated prior to induction Oxygen Delivery Method: Circle system utilized Preoxygenation: Pre-oxygenation with 100% oxygen Induction Type: IV induction Ventilation: Mask ventilation without difficulty LMA: LMA inserted LMA Size: 4.0 Number of attempts: 1 Airway Equipment and Method: Bite block Placement Confirmation: positive ETCO2 Tube secured with: Tape Dental Injury: Teeth and Oropharynx as per pre-operative assessment

## 2022-09-21 ENCOUNTER — Encounter (HOSPITAL_BASED_OUTPATIENT_CLINIC_OR_DEPARTMENT_OTHER): Payer: Self-pay | Admitting: Obstetrics and Gynecology

## 2022-09-21 LAB — SURGICAL PATHOLOGY

## 2022-09-23 DIAGNOSIS — Z419 Encounter for procedure for purposes other than remedying health state, unspecified: Secondary | ICD-10-CM | POA: Diagnosis not present

## 2022-09-26 ENCOUNTER — Other Ambulatory Visit: Payer: Self-pay

## 2022-10-02 ENCOUNTER — Encounter: Payer: Self-pay | Admitting: Obstetrics and Gynecology

## 2022-10-13 HISTORY — PX: BREAST ENHANCEMENT SURGERY: SHX7

## 2022-10-20 ENCOUNTER — Other Ambulatory Visit (HOSPITAL_COMMUNITY): Payer: Self-pay

## 2022-10-24 ENCOUNTER — Other Ambulatory Visit (HOSPITAL_COMMUNITY): Payer: Self-pay

## 2022-10-24 DIAGNOSIS — Z419 Encounter for procedure for purposes other than remedying health state, unspecified: Secondary | ICD-10-CM | POA: Diagnosis not present

## 2022-11-02 ENCOUNTER — Ambulatory Visit (INDEPENDENT_AMBULATORY_CARE_PROVIDER_SITE_OTHER): Payer: Medicaid Other | Admitting: Obstetrics and Gynecology

## 2022-11-02 ENCOUNTER — Encounter: Payer: Self-pay | Admitting: Obstetrics and Gynecology

## 2022-11-02 VITALS — BP 110/76 | HR 88 | Resp 16 | Ht 67.0 in | Wt 113.0 lb

## 2022-11-02 DIAGNOSIS — Z09 Encounter for follow-up examination after completed treatment for conditions other than malignant neoplasm: Secondary | ICD-10-CM

## 2022-11-02 NOTE — Progress Notes (Signed)
   POSTOPERATIVE GYNECOLOGY VISIT  Subjective:  RONNAH HORNE is a 37 y.o. [id number] who is 6 weeks s/p Novasure endometrial ablation presenting for post op follow up  Doing well. Had bothersome discharge that has now stopped. Had very light bleeding yesterday and her period is due to come today. Light cramping, but manageable. Happy w/ her results thus far. Had breast surgery 3 weeks after her ablation and is doing well from that standpoint, too.  Pathology from surgery was benign.   Objective:   Vitals:   11/02/22 0847  BP: 110/76  Pulse: 88  Resp: 16  Weight: 113 lb (51.3 kg)  Height: 5\' 7"  (1.702 m)   General:  Alert, oriented and cooperative. Patient is in no acute distress.  Skin: Skin is warm and dry. No rash noted.   Cardiovascular: Normal heart rate noted  Respiratory: Normal respiratory effort, no problems with respiration noted  Abdomen: Soft, non-tender, non-distended    Assessment and Plan:  KEVIONA KENNEBECK is a 37 y.o. presenting for post operative appointment   No restrictions Discussed rate of failure over several years (esp given her age) but we can reassess prn Reviewed intra-op images Pap UTD (NILM/neg 02/2022), will need breast MRI q56yr per her surgeons  RTC in 1 year for annual exam  Future Appointments  Date Time Provider Department Center  11/23/2022  8:40 AM Gearldine Bienenstock, PA-C [facility] None   Lennart Pall, MD

## 2022-11-09 NOTE — Progress Notes (Unsigned)
Office Visit Note  Patient: Robin [name]             Date of Birth: 06/30/1986           MRN: [id number]             PCP: Donzetta Sprung, NP Referring: Donzetta Sprung, NP Visit Date: 11/23/2022 Occupation: @GUAROCC @  Subjective:  Medication monitoring   History of Present Illness: Robin [name] is a 37 y.o. female with history of seropositive rheumatoid arthritis.  Patient is prescribed Orencia 125 mg subcutaneous injections once weekly and Arava 20 mg 1 tablet by mouth daily.  Patient reports that she held both [place] and arava 3 weeks prior to her breast augmentation surgery and then 4 weeks after surgery.  She has not had any signs of infection or complications.  She is scheduled for her 6-week postop visit this Friday.  She resumed Orencia and Arava about 2 weeks ago.  She states for the past couple of days she has had some increased discomfort in her left wrist and left hand.  She states last evening she walked 3 miles and this morning was having some increased pain and inflammation in the right knee which has started to subside as the morning has gone on.  She has been taking ibuprofen 800 mg as needed for symptomatic relief but overall has done quite well despite the gap in therapy.  She has no other upcoming procedures scheduled.  She has not had any recurrent infections.     Activities of Daily Living:  Patient reports morning stiffness for 0 minutes.   Patient Denies nocturnal pain.  Difficulty dressing/grooming: Denies Difficulty climbing stairs: Denies Difficulty getting out of chair: Denies Difficulty using hands for taps, buttons, cutlery, and/or writing: Denies  Review of Systems  Constitutional:  Negative for fatigue.  HENT:  Negative for mouth sores and mouth dryness.   Eyes:  Negative for dryness.  Respiratory:  Negative for shortness of breath.   Cardiovascular:  Negative for chest pain and palpitations.  Gastrointestinal:  Negative for blood in  stool, constipation and diarrhea.  Endocrine: Negative for increased urination.  Genitourinary:  Negative for involuntary urination.  Musculoskeletal:  Positive for joint pain and joint pain. Negative for gait problem, joint swelling, myalgias, muscle weakness, morning stiffness, muscle tenderness and myalgias.  Skin:  Negative for color change, rash, hair loss and sensitivity to sunlight.  Allergic/Immunologic: Positive for susceptible to infections.  Neurological:  Negative for dizziness and headaches.  Hematological:  Negative for swollen glands.  Psychiatric/Behavioral:  Negative for depressed mood and sleep disturbance. The patient is not nervous/anxious.     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 12/14/2020   Anxiety 09/21/2020   Rheumatoid arthritis involving multiple sites with positive rheumatoid factor (HCC) 01/14/2019   Normal labor 07/09/2018   Pregnancy with history of neonatal death 05/26/18   Depression affecting pregnancy 04/02/2018   Irregular periods 01/26/2018   Hereditary disease in family possibly affecting fetus, affecting management of mother, antepartum condition or complication, not applicable or unspecified fetus    History of preterm delivery 11/17/2017   Previous cesarean section 11/17/2017   Family history of congenital anomaly 11/17/2017   Family history of congenital heart disease 07/27/2015   Family history of mitral valve prolapse 07/27/2015   Family hx of bicuspid aortic valve--FOB 04/03/2012  Past Medical History:  Diagnosis Date   Abnormal Pap smear 2008   HAD HPV;HAD GENITAL WART REMOVED;LAST PAP 07/2010 WAS NORMAL   Anemia    FeSO4 SUPP TAKEN IN PAST   Asthma    mild asthma , only uses inhaler when she is sick   Complication of anesthesia    pt states that she was difficult to wake up after previous  surgery and had some nausea   COVID-19 01/25/2020   mild case, but has long-term effects, as of 08/2022 patient still has no taste or smell and gets sick easily   Daytime hypersomnia    Had sleep study in 06/2022. Negative for sleep apnea. Follows w/ Dr. Rosilyn Mings pulmonology and [facility] medicine at [facility]   Depression    after the death of her son, was on meds then   Heart murmur    as a child   History of smoking 12/26/2011   Reports quit '12   Infection    UTI;CAN GET FREQ   PONV (postoperative nausea and vomiting)    Rheumatoid arthritis (HCC)    Follows w/ [facility].   RLS (restless legs syndrome)    Vaginal Pap smear, abnormal     Family History  Problem Relation Age of Onset   Mitral valve prolapse Mother        DIED @ 49 3 OF CONDITION   Healthy Father    Mitral valve prolapse Paternal Aunt    Heart attack Paternal Grandfather        DECEASED   Dementia Maternal Grandmother    Healthy Son    Autism Daughter    Healthy Daughter    Healthy Daughter    Healthy Daughter    Past Surgical History:  Procedure Laterality Date   BREAST ENHANCEMENT SURGERY  10/13/2022   CESAREAN SECTION N/A 09/09/2016   Procedure: CESAREAN SECTION;  Surgeon: Tereso Newcomer, MD;  Location: [facility];  Service: Obstetrics;  Laterality: N/A;   DILITATION & CURRETTAGE/HYSTROSCOPY WITH NOVASURE ABLATION N/A 09/20/2022   Procedure: DILATATION & CURETTAGE/HYSTEROSCOPY WITH NOVASURE ABLATION;  Surgeon: Lennart Pall, MD;  Location: [facility];  Service: Gynecology;  Laterality: N/A;   NASAL SINUS SURGERY  07/25/2014   Social History   Social History Narrative   Not on file   There is no immunization history for the selected administration types on file for this patient.   Objective: Vital Signs: BP 112/74 (BP Location: Right Arm, Patient Position: Sitting, Cuff Size: Normal)   Pulse 96   Ht 5' 6.5" (1.689 m)   Wt 113 lb (51.3 kg)   BMI  17.97 kg/m    Physical Exam Vitals and nursing note reviewed.  Constitutional:      Appearance: She is well-developed.  HENT:     Head: Normocephalic and atraumatic.  Eyes:     Conjunctiva/sclera: Conjunctivae normal.  Cardiovascular:     Rate and Rhythm: Normal rate and regular rhythm.     Heart sounds: Normal heart sounds.  Pulmonary:     Effort: Pulmonary effort is normal.     Breath sounds: Normal breath sounds.  Abdominal:     General: Bowel sounds are normal.     Palpations: Abdomen is soft.  Musculoskeletal:     Cervical back: Normal range of motion.  Lymphadenopathy:     Cervical: No cervical adenopathy.  Skin:    General: Skin is warm and dry.     Capillary Refill: Capillary refill  takes less than 2 seconds.  Neurological:     Mental Status: She is alert and oriented to person, place, and time.  Psychiatric:        Behavior: Behavior normal.      Musculoskeletal Exam: C-spine, thoracic spine, lumbar spine have good range of motion.  Shoulder joints, elbow joints, wrist joints, MCPs, PIPs, DIPs have good range of motion with no synovitis.  Complete fist formation bilaterally.  Hip joints have good range of motion with no groin pain.  Knee joints have good range of motion with no effusion.  Ankle joints have good range of motion with no tenderness or joint swelling.  No tenderness or synovitis over MTP joints.  CDAI Exam: CDAI Score: -- Patient Global: 1 mm; Provider Global: 1 mm Swollen: --; Tender: -- Joint Exam 11/23/2022   No joint exam has been documented for this visit   There is currently no information documented on the homunculus. Go to the Rheumatology activity and complete the homunculus joint exam.  Investigation: No additional findings.  Imaging: No results found.  Recent Labs: Lab Results  Component Value Date   WBC 5.0 08/23/2022   HGB 13.2 08/23/2022   PLT 239 08/23/2022   NA 140 08/23/2022   K 4.6 08/23/2022   CL 108 08/23/2022    CO2 27 08/23/2022   GLUCOSE 79 08/23/2022   BUN 9 08/23/2022   CREATININE 0.66 08/23/2022   BILITOT 0.5 08/23/2022   ALKPHOS 61 03/30/2022   AST 17 08/23/2022   ALT 20 08/23/2022   PROT 7.1 08/23/2022   ALBUMIN 4.4 03/30/2022   CALCIUM 9.0 08/23/2022   GFRAA 132 10/20/2020   QFTBGOLDPLUS NEGATIVE 12/21/2021    Speciality Comments: humira 04/22,   PLQ, MTx, Enbrel  Contraception-vasectomy  Procedures:  No procedures performed Allergies: Other   Assessment / Plan:     Visit Diagnoses: Rheumatoid arthritis with rheumatoid factor of multiple sites without organ or systems involvement (HCC) - She failed Humira, Enbrel, methotrexate and Plaquenil in the past: She has no synovitis on examination today.  Patient recently had a gap in therapy 3 weeks preop and 4 weeks postop after undergoing breast augmentation.  She is scheduled for her 6-week follow-up on Friday and has had no complications or signs of infection.  She resumed both Orencia and Arava 2 weeks ago.  For the past several days she has had some increased discomfort in her left wrist, left hand, and right knee joint.  Last evening she walked 3 miles and this morning woke up with some increased pain and inflammation in her right knee which is started to subside as the morning is gone on.  She has a prescription for ibuprofen 800 mg which she has been taking as needed for symptomatic relief.  Overall her rheumatoid arthritis has been well-controlled despite the gap in therapy.  No medication changes will be made at this time.  She was advised to notify us if she develops signs or symptoms of recurrent flares.  She will follow-up in the office in 3 months or sooner if needed.  High risk medication use - Orencia 125 mg subcutaneous injections once weekly and Arava 20 mg 1 tablet by mouth daily.  CBC and CMP updated on 08/23/22. Orders for CBC and CMP released today.  TB gold negative on 12/21/21. Order for TB gold released today.  No  recent or recurrent infections.  Discussed the importance of holding orencia and arava if she develops signs or symptoms of  an infection and to resume once the infection has completely cleared. - Plan: CBC with Differential/Platelet, COMPLETE METABOLIC PANEL WITH GFR, QuantiFERON-TB Gold Plus  Screening for tuberculosis -Order for TB gold released today.  Plan: QuantiFERON-TB Gold Plus  Other fatigue: Stable.  Chronic pain of left ankle: No joint tenderness or synovitis noted on examination today.  Other medical conditions are listed as follows:   Family hx of bicuspid aortic valve--FOB  Family history of congenital heart disease  History of preterm delivery   Orders: Orders Placed This Encounter  Procedures   CBC with Differential/Platelet   COMPLETE METABOLIC PANEL WITH GFR   QuantiFERON-TB Gold Plus   No orders of the defined types were placed in this encounter.   Follow-Up Instructions: Return in about 3 months (around 02/23/2023) for Rheumatoid arthritis.   Gearldine Bienenstock, PA-C  Note - This record has been created using Dragon software.  Chart creation errors have been sought, but may not always  have been located. Such creation errors do not reflect on  the standard of medical care.

## 2022-11-22 ENCOUNTER — Ambulatory Visit: Payer: Medicaid Other | Admitting: Physician Assistant

## 2022-11-23 ENCOUNTER — Ambulatory Visit: Payer: Medicaid Other | Attending: Physician Assistant | Admitting: Physician Assistant

## 2022-11-23 ENCOUNTER — Encounter: Payer: Self-pay | Admitting: Physician Assistant

## 2022-11-23 VITALS — BP 112/74 | HR 96 | Ht 66.5 in | Wt 113.0 lb

## 2022-11-23 DIAGNOSIS — M0579 Rheumatoid arthritis with rheumatoid factor of multiple sites without organ or systems involvement: Secondary | ICD-10-CM

## 2022-11-23 DIAGNOSIS — G8929 Other chronic pain: Secondary | ICD-10-CM | POA: Diagnosis not present

## 2022-11-23 DIAGNOSIS — Z8774 Personal history of (corrected) congenital malformations of heart and circulatory system: Secondary | ICD-10-CM

## 2022-11-23 DIAGNOSIS — Z8279 Family history of other congenital malformations, deformations and chromosomal abnormalities: Secondary | ICD-10-CM

## 2022-11-23 DIAGNOSIS — Z79899 Other long term (current) drug therapy: Secondary | ICD-10-CM | POA: Diagnosis not present

## 2022-11-23 DIAGNOSIS — M25572 Pain in left ankle and joints of left foot: Secondary | ICD-10-CM

## 2022-11-23 DIAGNOSIS — Z419 Encounter for procedure for purposes other than remedying health state, unspecified: Secondary | ICD-10-CM | POA: Diagnosis not present

## 2022-11-23 DIAGNOSIS — Z8751 Personal history of pre-term labor: Secondary | ICD-10-CM | POA: Diagnosis not present

## 2022-11-23 DIAGNOSIS — R5383 Other fatigue: Secondary | ICD-10-CM | POA: Diagnosis not present

## 2022-11-23 DIAGNOSIS — Z111 Encounter for screening for respiratory tuberculosis: Secondary | ICD-10-CM | POA: Diagnosis not present

## 2022-11-23 NOTE — Patient Instructions (Signed)
Standing Labs We placed an order today for your standing lab work.   Please have your standing labs drawn in August and every 3 months   Please have your labs drawn 2 weeks prior to your appointment so that the provider can discuss your lab results at your appointment, if possible.  Please note that you may see your imaging and lab results in MyChart before we have reviewed them. We will contact you once all results are reviewed. Please allow our office up to 72 hours to thoroughly review all of the results before contacting the office for clarification of your results.  WALK-IN LAB HOURS  Monday through Thursday from 8:00 am -12:30 pm and 1:00 pm-5:00 pm and Friday from 8:00 am-12:00 pm.  Patients with office visits requiring labs will be seen before walk-in labs.  You may encounter longer than normal wait times. Please allow additional time. Wait times may be shorter on  Monday and Thursday afternoons.  We do not book appointments for walk-in labs. We appreciate your patience and understanding with our staff.   Labs are drawn by [facility]. Please bring your co-pay at the time of your lab draw.  You may receive a bill from [facility] for your lab work.  Please note if you are on Hydroxychloroquine and and an order has been placed for a Hydroxychloroquine level,  you will need to have it drawn 4 hours or more after your last dose.  If you wish to have your labs drawn at another location, please call the office 24 hours in advance so we can fax the orders.  The office is located at [address], [address], [place],  [address]   If you have any questions regarding directions or hours of operation,  please call [phone].   As a reminder, please drink plenty of water prior to coming for your lab work. Thanks!  

## 2022-11-24 NOTE — Progress Notes (Signed)
CBC and CMP WNL

## 2022-11-25 LAB — CBC WITH DIFFERENTIAL/PLATELET
Absolute Monocytes: 562 cells/uL (ref 200–950)
Basophils Absolute: 81 cells/uL (ref 0–200)
Basophils Relative: 1.1 %
Eosinophils Absolute: 400 cells/uL (ref 15–500)
Eosinophils Relative: 5.4 %
HCT: 42.6 % (ref 35.0–45.0)
Hemoglobin: 13.8 g/dL (ref 11.7–15.5)
Lymphs Abs: 1613 cells/uL (ref 850–3900)
MCH: 31.2 pg (ref 27.0–33.0)
MCHC: 32.4 g/dL (ref 32.0–36.0)
MCV: 96.2 fL (ref 80.0–100.0)
MPV: 10.1 fL (ref 7.5–12.5)
Monocytes Relative: 7.6 %
Neutro Abs: 4743 cells/uL (ref 1500–7800)
Neutrophils Relative %: 64.1 %
Platelets: 240 10*3/uL (ref 140–400)
RBC: 4.43 10*6/uL (ref 3.80–5.10)
RDW: 11.5 % (ref 11.0–15.0)
Total Lymphocyte: 21.8 %
WBC: 7.4 10*3/uL (ref 3.8–10.8)

## 2022-11-25 LAB — COMPLETE METABOLIC PANEL WITH GFR
AG Ratio: 1.8 (calc) (ref 1.0–2.5)
ALT: 15 U/L (ref 6–29)
AST: 17 U/L (ref 10–30)
Albumin: 4.7 g/dL (ref 3.6–5.1)
Alkaline phosphatase (APISO): 55 U/L (ref 31–125)
BUN: 11 mg/dL (ref 7–25)
CO2: 25 mmol/L (ref 20–32)
Calcium: 9.1 mg/dL (ref 8.6–10.2)
Chloride: 106 mmol/L (ref 98–110)
Creat: 0.69 mg/dL (ref 0.50–0.97)
Globulin: 2.6 g/dL (calc) (ref 1.9–3.7)
Glucose, Bld: 67 mg/dL (ref 65–99)
Potassium: 4.3 mmol/L (ref 3.5–5.3)
Sodium: 140 mmol/L (ref 135–146)
Total Bilirubin: 0.4 mg/dL (ref 0.2–1.2)
Total Protein: 7.3 g/dL (ref 6.1–8.1)
eGFR: 116 mL/min/{1.73_m2} (ref >=60)

## 2022-11-25 LAB — QUANTIFERON-TB GOLD PLUS
Mitogen-NIL: >10 IU/mL
NIL: 0.03 IU/mL
QuantiFERON-TB Gold Plus: NEGATIVE
TB1-NIL: 0.02 IU/mL
TB2-NIL: 0 IU/mL

## 2022-11-25 NOTE — Progress Notes (Signed)
TB gold negative

## 2022-12-01 ENCOUNTER — Other Ambulatory Visit: Payer: Self-pay

## 2022-12-05 ENCOUNTER — Other Ambulatory Visit: Payer: Self-pay

## 2022-12-24 DIAGNOSIS — Z419 Encounter for procedure for purposes other than remedying health state, unspecified: Secondary | ICD-10-CM | POA: Diagnosis not present

## 2022-12-26 ENCOUNTER — Other Ambulatory Visit: Payer: Self-pay

## 2022-12-28 ENCOUNTER — Other Ambulatory Visit: Payer: Self-pay

## 2023-01-23 DIAGNOSIS — Z419 Encounter for procedure for purposes other than remedying health state, unspecified: Secondary | ICD-10-CM | POA: Diagnosis not present

## 2023-01-24 ENCOUNTER — Other Ambulatory Visit (HOSPITAL_COMMUNITY): Payer: Self-pay

## 2023-01-27 ENCOUNTER — Other Ambulatory Visit (HOSPITAL_COMMUNITY): Payer: Self-pay

## 2023-01-30 ENCOUNTER — Other Ambulatory Visit (HOSPITAL_COMMUNITY): Payer: Self-pay

## 2023-01-31 ENCOUNTER — Other Ambulatory Visit (HOSPITAL_COMMUNITY): Payer: Self-pay

## 2023-01-31 ENCOUNTER — Telehealth: Payer: Self-pay

## 2023-01-31 DIAGNOSIS — Z79899 Other long term (current) drug therapy: Secondary | ICD-10-CM

## 2023-01-31 DIAGNOSIS — M0579 Rheumatoid arthritis with rheumatoid factor of multiple sites without organ or systems involvement: Secondary | ICD-10-CM

## 2023-01-31 NOTE — Telephone Encounter (Unsigned)
Received notification from [facility] that pt has obtained new insurance and will need a new PA. Additionally, claim indicates that "service not appropriate for this location" meaning that pt will be locked [facility].  Submitted a Prior Authorization request to [facility] for [facility] via CoverMyMeds. Will update once we receive a response.  Key: [id number]

## 2023-02-01 ENCOUNTER — Other Ambulatory Visit (HOSPITAL_COMMUNITY): Payer: Self-pay

## 2023-02-02 ENCOUNTER — Other Ambulatory Visit (HOSPITAL_COMMUNITY): Payer: Self-pay

## 2023-02-03 ENCOUNTER — Other Ambulatory Visit (HOSPITAL_COMMUNITY): Payer: Self-pay

## 2023-02-03 MED ORDER — ORENCIA CLICKJECT 125 MG/ML ~~LOC~~ SOAJ
125.0000 mg | SUBCUTANEOUS | 0 refills | Status: DC
Start: 2023-02-03 — End: 2023-03-15

## 2023-02-03 NOTE — Telephone Encounter (Signed)
Received notification from [facility] regarding a prior authorization for [facility]. Authorization has been APPROVED from 02/03/23 to 02/03/24. Approval letter sent to scan center.  Patient must fill through [facility]: [id number]  Authorization # [id number]  ATC patient to discuss. Unable to reach. Left detailed VM with phamracy phone. Orencia rx sent to pharmacy with one fill only. She is due for labs in Aug 2024.  Emailed [facility] to deactivate pt.  Chesley Mires, PharmD, MPH, BCPS, CPP Clinical Pharmacist (Rheumatology and Pulmonology)

## 2023-02-14 NOTE — Progress Notes (Signed)
Office Visit Note  Patient: Robin [name]             Date of Birth: 1986-05-09           MRN: [id number]             PCP: Donzetta Sprung, NP Referring: Donzetta Sprung, NP Visit Date: 02/28/2023 Occupation: @GUAROCC @  Subjective:  Medication management  History of Present Illness: Robin [name] is a 37 y.o. female with seropositive rheumatoid arthritis.  She states she missed 3 doses of Orencia injections says she has been under a lot of stress.  She states her anxiety and depression is getting worse and she has an appointment coming up with the PCP for workup.  She did not have a flare of rheumatoid arthritis even after skipping Orencia.  She has been taking leflunomide 20 mg daily on a regular basis.  She states her joints have been doing well and she has not had any flares.    Activities of Daily Living:  Patient reports morning stiffness for 0 minutes.   Patient Denies nocturnal pain.  Difficulty dressing/grooming: Denies Difficulty climbing stairs: Denies Difficulty getting out of chair: Denies Difficulty using hands for taps, buttons, cutlery, and/or writing: Denies  Review of Systems  Constitutional:  Positive for fatigue.  HENT:  Negative for mouth sores and mouth dryness.   Eyes:  Negative for dryness.  Respiratory:  Negative for shortness of breath.   Cardiovascular:  Negative for chest pain and palpitations.  Gastrointestinal:  Negative for blood in stool, constipation and diarrhea.  Endocrine: Negative for increased urination.  Genitourinary:  Negative for involuntary urination.  Musculoskeletal:  Negative for joint pain, gait problem, joint pain, joint swelling, myalgias, muscle weakness, morning stiffness, muscle tenderness and myalgias.  Skin:  Negative for color change, rash, hair loss and sensitivity to sunlight.  Allergic/Immunologic: Positive for susceptible to infections.  Neurological:  Negative for dizziness and headaches.  Hematological:   Negative for swollen glands.  Psychiatric/Behavioral:  Positive for depressed mood and sleep disturbance. The patient is nervous/anxious.     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 12/14/2020   Anxiety 09/21/2020   Rheumatoid arthritis involving multiple sites with positive rheumatoid factor (HCC) 01/14/2019   Normal labor 07/09/2018   Pregnancy with history of neonatal death 12-May-2018   Depression affecting pregnancy 04/02/2018   Irregular periods 01/26/2018   Hereditary disease in family possibly affecting fetus, affecting management of mother, antepartum condition or complication, not applicable or unspecified fetus    History of preterm delivery 11/17/2017   Previous cesarean section 11/17/2017   Family history of congenital anomaly 11/17/2017   Family history of congenital heart disease 07/27/2015   Family history of mitral valve prolapse 07/27/2015   Family hx of bicuspid aortic valve--FOB 04/03/2012    Past Medical History:  Diagnosis Date   Abnormal Pap smear 2008   HAD HPV;HAD GENITAL WART REMOVED;LAST PAP 07/2010 WAS NORMAL   Anemia    FeSO4 SUPP TAKEN IN PAST   Asthma    mild asthma , only uses inhaler when she is sick   Complication of anesthesia    pt states that she was difficult to wake up after previous surgery and had some nausea   COVID-19 01/25/2020   mild case, but has long-term effects, as of 08/2022 patient still has no  taste or smell and gets sick easily   Daytime hypersomnia    Had sleep study in 06/2022. Negative for sleep apnea. Follows w/ Dr. Rosilyn Mings pulmonology and sleep medicine at [facility]   Depression    after the death of her son, was on meds then   Heart murmur    as a child   History of smoking 12/26/2011   Reports quit '12   Infection    UTI;CAN GET FREQ   PONV (postoperative nausea and vomiting)     Rheumatoid arthritis (HCC)    Follows w/ [facility].   RLS (restless legs syndrome)    Vaginal Pap smear, abnormal     Family History  Problem Relation Age of Onset   Mitral valve prolapse Mother        DIED @ 27 66 OF CONDITION   Healthy Father    Mitral valve prolapse Paternal Aunt    Heart attack Paternal Grandfather        DECEASED   Dementia Maternal Grandmother    Healthy Son    Autism Daughter    Healthy Daughter    Healthy Daughter    Healthy Daughter    Past Surgical History:  Procedure Laterality Date   BREAST ENHANCEMENT SURGERY  10/13/2022   CESAREAN SECTION N/A 09/09/2016   Procedure: CESAREAN SECTION;  Surgeon: Tereso Newcomer, MD;  Location: [facility];  Service: Obstetrics;  Laterality: N/A;   DILITATION & CURRETTAGE/HYSTROSCOPY WITH NOVASURE ABLATION N/A 09/20/2022   Procedure: DILATATION & CURETTAGE/HYSTEROSCOPY WITH NOVASURE ABLATION;  Surgeon: Lennart Pall, MD;  Location: [facility];  Service: Gynecology;  Laterality: N/A;   NASAL SINUS SURGERY  07/25/2014   Social History   Social History Narrative   Not on file   There is no immunization history for the selected administration types on file for this patient.   Objective: Vital Signs: BP 120/77 (BP Location: Left Arm, Patient Position: Sitting, Cuff Size: Normal)   Pulse (!) 109   Resp 16   Ht 5' 5.5" (1.664 m)   Wt 115 lb 6.4 oz (52.3 kg)   BMI 18.91 kg/m    Physical Exam Vitals and nursing note reviewed.  Constitutional:      Appearance: She is well-developed.  HENT:     Head: Normocephalic and atraumatic.  Eyes:     Conjunctiva/sclera: Conjunctivae normal.  Cardiovascular:     Rate and Rhythm: Normal rate and regular rhythm.     Heart sounds: Normal heart sounds.  Pulmonary:     Effort: Pulmonary effort is normal.     Breath sounds: Normal breath sounds.  Abdominal:     General: Bowel sounds are normal.     Palpations: Abdomen is  soft.  Musculoskeletal:     Cervical back: Normal range of motion.  Lymphadenopathy:     Cervical: No cervical adenopathy.  Skin:    General: Skin is warm and dry.     Capillary Refill: Capillary refill takes less than 2 seconds.  Neurological:     Mental Status: She is alert and oriented to person, place, and time.  Psychiatric:        Behavior: Behavior normal.      Musculoskeletal Exam: Cervical thoracic and lumbar spine were in good range of motion.  Shoulder joints, elbow joints, wrist joints, MCPs PIPs and DIPs been good range of motion with no synovitis.  Hip joints, knee joints, ankles, MTPs and PIPs were in good range of  motion with no synovitis.  CDAI Exam: CDAI Score: -- Patient Global: 2 / 100; Provider Global: 2 / 100 Swollen: --; Tender: -- Joint Exam 02/28/2023   No joint exam has been documented for this visit   There is currently no information documented on the homunculus. Go to the Rheumatology activity and complete the homunculus joint exam.  Investigation: No additional findings.  Imaging: No results found.  Recent Labs: Lab Results  Component Value Date   WBC 7.4 11/23/2022   HGB 13.8 11/23/2022   PLT 240 11/23/2022   NA 140 11/23/2022   K 4.3 11/23/2022   CL 106 11/23/2022   CO2 25 11/23/2022   GLUCOSE 67 11/23/2022   BUN 11 11/23/2022   CREATININE 0.69 11/23/2022   BILITOT 0.4 11/23/2022   ALKPHOS 61 03/30/2022   AST 17 11/23/2022   ALT 15 11/23/2022   PROT 7.3 11/23/2022   ALBUMIN 4.4 03/30/2022   CALCIUM 9.1 11/23/2022   GFRAA 132 10/20/2020   QFTBGOLDPLUS NEGATIVE 11/23/2022    Speciality Comments: humira 04/22,   PLQ, MTx, Enbrel  Contraception-vasectomy  Procedures:  No procedures performed Allergies: Other   Assessment / Plan:     Visit Diagnoses: Rheumatoid arthritis with rheumatoid factor of multiple sites without organ or systems involvement (HCC) - She failed Humira, Enbrel, methotrexate and Plaquenil in the past:  Patient reports that she missed 3 doses of Orencia as she forgot to take the dose..  She has been taking leflunomide 20 mg daily without any interruption.  She has been under a lot of stress and feeling depressed.  She has an appointment coming up with her PCP.  She denies any flares of rheumatoid arthritis since she has been on the combination therapy.  High risk medication use - Orencia 125 mg subcutaneous injections once weekly and Arava 20 mg 1 tablet by mouth daily.  Labs obtained on Nov 23, 2022 CBC and CMP were normal.  TB Gold was negative.  Will check labs today and then every 3 months.  Information on immunization was placed in the AVS.  She was advised to hold Orencia and leflunomide if she develops an infection resume after the infection resolves.- Plan: CBC with Differential/Platelet, COMPLETE METABOLIC PANEL WITH GFR  Other fatigue-she states her fatigue is worse but not because of rheumatoid arthritis.  She states is due to depression and anxiety.  She has an appointment coming up with the PCP.  Chronic pain of left ankle-resolved.  Family hx of bicuspid aortic valve--FOB  Family history of congenital heart disease  History of preterm delivery  Anxiety and depression-she is currently not taking any medications.  She has an appointment coming up with the PCP.  Orders: Orders Placed This Encounter  Procedures   CBC with Differential/Platelet   COMPLETE METABOLIC PANEL WITH GFR   No orders of the defined types were placed in this encounter.    Follow-Up Instructions: Return in about 3 months (around 05/31/2023) for Rheumatoid arthritis.   Pollyann Savoy, MD  Note - This record has been created using Animal nutritionist.  Chart creation errors have been sought, but may not always  have been located. Such creation errors do not reflect on  the standard of medical care.

## 2023-02-23 DIAGNOSIS — Z419 Encounter for procedure for purposes other than remedying health state, unspecified: Secondary | ICD-10-CM | POA: Diagnosis not present

## 2023-02-28 ENCOUNTER — Encounter: Payer: Self-pay | Admitting: Rheumatology

## 2023-02-28 ENCOUNTER — Ambulatory Visit: Payer: BC Managed Care – PPO | Attending: Rheumatology | Admitting: Rheumatology

## 2023-02-28 VITALS — BP 120/77 | HR 109 | Resp 16 | Ht 65.5 in | Wt 115.4 lb

## 2023-02-28 DIAGNOSIS — M25572 Pain in left ankle and joints of left foot: Secondary | ICD-10-CM | POA: Diagnosis not present

## 2023-02-28 DIAGNOSIS — Z79899 Other long term (current) drug therapy: Secondary | ICD-10-CM

## 2023-02-28 DIAGNOSIS — Z8279 Family history of other congenital malformations, deformations and chromosomal abnormalities: Secondary | ICD-10-CM

## 2023-02-28 DIAGNOSIS — F32A Depression, unspecified: Secondary | ICD-10-CM

## 2023-02-28 DIAGNOSIS — M0579 Rheumatoid arthritis with rheumatoid factor of multiple sites without organ or systems involvement: Secondary | ICD-10-CM | POA: Diagnosis not present

## 2023-02-28 DIAGNOSIS — Z8774 Personal history of (corrected) congenital malformations of heart and circulatory system: Secondary | ICD-10-CM

## 2023-02-28 DIAGNOSIS — F419 Anxiety disorder, unspecified: Secondary | ICD-10-CM

## 2023-02-28 DIAGNOSIS — R5383 Other fatigue: Secondary | ICD-10-CM | POA: Diagnosis not present

## 2023-02-28 DIAGNOSIS — Z8751 Personal history of pre-term labor: Secondary | ICD-10-CM

## 2023-02-28 DIAGNOSIS — G8929 Other chronic pain: Secondary | ICD-10-CM

## 2023-02-28 LAB — CBC WITH DIFFERENTIAL/PLATELET
Absolute Monocytes: 636 cells/uL (ref 200–950)
Basophils Absolute: 82 cells/uL (ref 0–200)
Basophils Relative: 1.3 %
Eosinophils Absolute: 403 cells/uL (ref 15–500)
Eosinophils Relative: 6.4 %
HCT: 39.3 % (ref 35.0–45.0)
Hemoglobin: 13.2 g/dL (ref 11.7–15.5)
Lymphs Abs: 1430 cells/uL (ref 850–3900)
MCH: 30.9 pg (ref 27.0–33.0)
MCHC: 33.6 g/dL (ref 32.0–36.0)
MCV: 92 fL (ref 80.0–100.0)
MPV: 10.7 fL (ref 7.5–12.5)
Monocytes Relative: 10.1 %
Neutro Abs: 3749 cells/uL (ref 1500–7800)
Neutrophils Relative %: 59.5 %
Platelets: 229 10*3/uL (ref 140–400)
RBC: 4.27 10*6/uL (ref 3.80–5.10)
RDW: 11.7 % (ref 11.0–15.0)
Total Lymphocyte: 22.7 %
WBC: 6.3 10*3/uL (ref 3.8–10.8)

## 2023-02-28 NOTE — Patient Instructions (Addendum)
Standing Labs We placed an order today for your standing lab work.   Please have your standing labs drawn in November and 3 months  Please have your labs drawn 2 weeks prior to your appointment so that the provider can discuss your lab results at your appointment, if possible.  Please note that you may see your imaging and lab results in MyChart before we have reviewed them. We will contact you once all results are reviewed. Please allow our office up to 72 hours to thoroughly review all of the results before contacting the office for clarification of your results.  WALK-IN LAB HOURS  Monday through Thursday from 8:00 am -12:30 pm and 1:00 pm-5:00 pm and Friday from 8:00 am-12:00 pm.  Patients with office visits requiring labs will be seen before walk-in labs.  You may encounter longer than normal wait times. Please allow additional time. Wait times may be shorter on  Monday and Thursday afternoons.  We do not book appointments for walk-in labs. We appreciate your patience and understanding with our staff.   Labs are drawn by [facility]. Please bring your co-pay at the time of your lab draw.  You may receive a bill from [facility] for your lab work.  Please note if you are on Hydroxychloroquine and and an order has been placed for a Hydroxychloroquine level,  you will need to have it drawn 4 hours or more after your last dose.  If you wish to have your labs drawn at another location, please call the office 24 hours in advance so we can fax the orders.  The office is located at [address], [address], [place], [place] [address]   If you have any questions regarding directions or hours of operation,  please call [phone].   As a reminder, please drink plenty of water prior to coming for your lab work. Thanks!   Vaccines You are taking a medication(s) that can suppress your immune system.  The following immunizations are recommended: Flu annually Covid-19  RSV Td/Tdap (tetanus,  diphtheria, pertussis) every 10 years Pneumonia (Prevnar 15 then Pneumovax 23 at least 1 year apart.  Alternatively, can take Prevnar 20 without needing additional dose) Shingrix: 2 doses from 4 weeks to 6 months apart  Please check with your PCP to make sure you are up to date.   If you have signs or symptoms of an infection or start antibiotics: First, call your PCP for workup of your infection. Hold your medication through the infection, until you complete your antibiotics, and until symptoms resolve if you take the following: Injectable medication (Actemra, Benlysta, Cimzia, Cosentyx, Enbrel, Humira, Kevzara, Orencia, Remicade, Simponi, Stelara, Taltz, Tremfya) Methotrexate Leflunomide (Arava) Mycophenolate (Cellcept) Harriette Ohara, Olumiant, or Rinvoq

## 2023-03-01 NOTE — Progress Notes (Signed)
CBC and CMP normal

## 2023-03-15 ENCOUNTER — Other Ambulatory Visit: Payer: Self-pay | Admitting: Physician Assistant

## 2023-03-15 DIAGNOSIS — Z79899 Other long term (current) drug therapy: Secondary | ICD-10-CM

## 2023-03-15 DIAGNOSIS — M0579 Rheumatoid arthritis with rheumatoid factor of multiple sites without organ or systems involvement: Secondary | ICD-10-CM

## 2023-03-15 NOTE — Telephone Encounter (Signed)
Last Fill: 02/03/2023 (30 day supply)  Labs: 02/28/2023 CBC and CMP normal.   TB Gold: 11/23/2022   Next Visit: 06/01/2023  Last Visit: 02/28/2023  DX: Rheumatoid arthritis with rheumatoid factor of multiple sites without organ or systems involvement   Current Dose per office note 02/28/2023: Orencia 125 mg subcutaneous injections once weekly   Okay to refill Orencia?

## 2023-03-20 ENCOUNTER — Ambulatory Visit: Payer: BC Managed Care – PPO | Admitting: Obstetrics and Gynecology

## 2023-03-26 DIAGNOSIS — Z419 Encounter for procedure for purposes other than remedying health state, unspecified: Secondary | ICD-10-CM | POA: Diagnosis not present

## 2023-04-20 ENCOUNTER — Ambulatory Visit: Payer: BC Managed Care – PPO | Admitting: Obstetrics and Gynecology

## 2023-04-20 ENCOUNTER — Encounter: Payer: Self-pay | Admitting: Obstetrics and Gynecology

## 2023-04-20 NOTE — Progress Notes (Signed)
Erroneous encounter

## 2023-04-25 DIAGNOSIS — Z419 Encounter for procedure for purposes other than remedying health state, unspecified: Secondary | ICD-10-CM | POA: Diagnosis not present

## 2023-05-02 ENCOUNTER — Other Ambulatory Visit: Payer: Self-pay | Admitting: Rheumatology

## 2023-05-02 NOTE — Telephone Encounter (Signed)
Last Fill: 09/14/2022  Labs: 02/28/2023 CBC and CMP normal.   Next Visit: 06/01/2023  Last Visit: 02/28/2023  DX: Rheumatoid arthritis with rheumatoid factor of multiple sites without organ or systems involvement   Current Dose per office note 02/28/2023: Arava 20 mg 1 tablet by mouth daily   Okay to refill Arava ?

## 2023-05-26 DIAGNOSIS — Z419 Encounter for procedure for purposes other than remedying health state, unspecified: Secondary | ICD-10-CM | POA: Diagnosis not present

## 2023-06-01 ENCOUNTER — Ambulatory Visit: Payer: BC Managed Care – PPO | Attending: Physician Assistant | Admitting: Physician Assistant

## 2023-06-01 ENCOUNTER — Encounter: Payer: Self-pay | Admitting: Physician Assistant

## 2023-06-01 VITALS — BP 111/72 | HR 91 | Resp 14 | Ht 65.5 in | Wt 114.6 lb

## 2023-06-01 DIAGNOSIS — M25572 Pain in left ankle and joints of left foot: Secondary | ICD-10-CM | POA: Diagnosis not present

## 2023-06-01 DIAGNOSIS — Z8751 Personal history of pre-term labor: Secondary | ICD-10-CM

## 2023-06-01 DIAGNOSIS — F32A Depression, unspecified: Secondary | ICD-10-CM

## 2023-06-01 DIAGNOSIS — R5383 Other fatigue: Secondary | ICD-10-CM | POA: Diagnosis not present

## 2023-06-01 DIAGNOSIS — Z79899 Other long term (current) drug therapy: Secondary | ICD-10-CM | POA: Diagnosis not present

## 2023-06-01 DIAGNOSIS — Z8774 Personal history of (corrected) congenital malformations of heart and circulatory system: Secondary | ICD-10-CM

## 2023-06-01 DIAGNOSIS — M0579 Rheumatoid arthritis with rheumatoid factor of multiple sites without organ or systems involvement: Secondary | ICD-10-CM | POA: Diagnosis not present

## 2023-06-01 DIAGNOSIS — Z9109 Other allergy status, other than to drugs and biological substances: Secondary | ICD-10-CM

## 2023-06-01 DIAGNOSIS — Z8279 Family history of other congenital malformations, deformations and chromosomal abnormalities: Secondary | ICD-10-CM

## 2023-06-01 DIAGNOSIS — G8929 Other chronic pain: Secondary | ICD-10-CM

## 2023-06-01 DIAGNOSIS — F419 Anxiety disorder, unspecified: Secondary | ICD-10-CM

## 2023-06-01 NOTE — Progress Notes (Signed)
Absolute eosinophils are elevated likely due to allergies. Rest of CBC WNL.

## 2023-06-01 NOTE — Patient Instructions (Signed)

## 2023-06-02 LAB — COMPLETE METABOLIC PANEL WITH GFR
AG Ratio: 1.9 (calc) (ref 1.0–2.5)
ALT: 19 U/L (ref 6–29)
AST: 17 U/L (ref 10–30)
Albumin: 4.6 g/dL (ref 3.6–5.1)
Alkaline phosphatase (APISO): 51 U/L (ref 31–125)
BUN: 12 mg/dL (ref 7–25)
CO2: 29 mmol/L (ref 20–32)
Calcium: 9.3 mg/dL (ref 8.6–10.2)
Chloride: 104 mmol/L (ref 98–110)
Creat: 0.71 mg/dL (ref 0.50–0.97)
Globulin: 2.4 g/dL (ref 1.9–3.7)
Glucose, Bld: 74 mg/dL (ref 65–99)
Potassium: 4.8 mmol/L (ref 3.5–5.3)
Sodium: 141 mmol/L (ref 135–146)
Total Bilirubin: 0.4 mg/dL (ref 0.2–1.2)
Total Protein: 7 g/dL (ref 6.1–8.1)
eGFR: 113 mL/min/{1.73_m2} (ref >=60)

## 2023-06-02 LAB — CBC WITH DIFFERENTIAL/PLATELET
Absolute Lymphocytes: 1460 {cells}/uL (ref 850–3900)
Absolute Monocytes: 781 {cells}/uL (ref 200–950)
Basophils Absolute: 88 {cells}/uL (ref 0–200)
Basophils Relative: 1.2 %
Eosinophils Absolute: 715 {cells}/uL — ABNORMAL HIGH (ref 15–500)
Eosinophils Relative: 9.8 %
HCT: 41.4 % (ref 35.0–45.0)
Hemoglobin: 13.7 g/dL (ref 11.7–15.5)
MCH: 32.1 pg (ref 27.0–33.0)
MCHC: 33.1 g/dL (ref 32.0–36.0)
MCV: 97 fL (ref 80.0–100.0)
MPV: 10.7 fL (ref 7.5–12.5)
Monocytes Relative: 10.7 %
Neutro Abs: 4256 {cells}/uL (ref 1500–7800)
Neutrophils Relative %: 58.3 %
Platelets: 239 10*3/uL (ref 140–400)
RBC: 4.27 10*6/uL (ref 3.80–5.10)
RDW: 11.1 % (ref 11.0–15.0)
Total Lymphocyte: 20 %
WBC: 7.3 10*3/uL (ref 3.8–10.8)

## 2023-06-02 NOTE — Progress Notes (Signed)
CMP WNL

## 2023-06-25 DIAGNOSIS — Z419 Encounter for procedure for purposes other than remedying health state, unspecified: Secondary | ICD-10-CM | POA: Diagnosis not present

## 2023-06-28 ENCOUNTER — Other Ambulatory Visit: Payer: Self-pay | Admitting: Rheumatology

## 2023-06-28 DIAGNOSIS — Z79899 Other long term (current) drug therapy: Secondary | ICD-10-CM

## 2023-06-28 DIAGNOSIS — M0579 Rheumatoid arthritis with rheumatoid factor of multiple sites without organ or systems involvement: Secondary | ICD-10-CM

## 2023-06-28 NOTE — Telephone Encounter (Signed)
Last Fill: 03/15/2023  Labs: 06/01/2023 Absolute eosinophils are elevated likely due to allergies. Rest of CBC WNL. CMP WNL   TB Gold: 11/23/2022 Neg    Next Visit: 09/05/2023  Last Visit: 06/01/2023  [id number] arthritis with rheumatoid factor of multiple sites without organ or systems involvement   Current Dose per office note 06/01/2023: Orencia 125 mg subcutaneous injections once weekly   Okay to refill Orencia?

## 2023-07-26 DIAGNOSIS — Z419 Encounter for procedure for purposes other than remedying health state, unspecified: Secondary | ICD-10-CM | POA: Diagnosis not present

## 2023-08-06 DIAGNOSIS — Z419 Encounter for procedure for purposes other than remedying health state, unspecified: Secondary | ICD-10-CM | POA: Diagnosis not present

## 2023-08-22 NOTE — Progress Notes (Deleted)
 Office Visit Note  Patient: Robin [name]             Date of Birth: 1986-01-29           MRN: [id number]             PCP: Donzetta Sprung, NP Referring: Donzetta Sprung, NP Visit Date: 09/05/2023 Occupation: @GUAROCC @  Subjective:  No chief complaint on file.   History of Present Illness: Robin [name] is a 38 y.o. female ***     Activities of Daily Living:  Patient reports morning stiffness for *** {minute/hour:19697}.   Patient {ACTIONS;DENIES/REPORTS:21021675::"Denies"} nocturnal pain.  Difficulty dressing/grooming: {ACTIONS;DENIES/REPORTS:21021675::"Denies"} Difficulty climbing stairs: {ACTIONS;DENIES/REPORTS:21021675::"Denies"} Difficulty getting out of chair: {ACTIONS;DENIES/REPORTS:21021675::"Denies"} Difficulty using hands for taps, buttons, cutlery, and/or writing: {ACTIONS;DENIES/REPORTS:21021675::"Denies"}  No Rheumatology ROS completed.   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 12/14/2020   Anxiety 09/21/2020   Rheumatoid arthritis involving multiple sites with positive rheumatoid factor (HCC) 01/14/2019   Normal labor 07/09/2018   Pregnancy with history of neonatal death 01-May-2018   Depression affecting pregnancy 04/02/2018   Irregular periods 01/26/2018   Hereditary disease in family possibly affecting fetus, affecting management of mother, antepartum condition or complication, not applicable or unspecified fetus    History of preterm delivery 11/17/2017   Previous cesarean section 11/17/2017   Family history of congenital anomaly 11/17/2017   Family history of congenital heart disease 07/27/2015   Family history of mitral valve prolapse 07/27/2015   Family hx of bicuspid aortic valve--FOB 04/03/2012    Past Medical History:  Diagnosis Date   Abnormal Pap smear 2008   HAD HPV;HAD GENITAL WART  REMOVED;LAST PAP 07/2010 WAS NORMAL   Anemia    FeSO4 SUPP TAKEN IN PAST   Asthma    mild asthma , only uses inhaler when she is sick   Complication of anesthesia    pt states that she was difficult to wake up after previous surgery and had some nausea   COVID-19 01/25/2020   mild case, but has long-term effects, as of 08/2022 patient still has no taste or smell and gets sick easily   Daytime hypersomnia    Had sleep study in 06/2022. Negative for sleep apnea. Follows w/ Dr. Rosilyn Mings pulmonology and sleep medicine at [facility]   Depression    after the death of her son, was on meds then   Heart murmur    as a child   History of smoking 12/26/2011   Reports quit '12   Infection    UTI;CAN GET FREQ   PONV (postoperative nausea and vomiting)    Rheumatoid arthritis (HCC)    Follows w/ [facility].   RLS (restless legs syndrome)    Vaginal Pap smear, abnormal     Family History  Problem Relation Age of Onset   Mitral valve prolapse Mother        DIED @ 65 65 OF CONDITION   Healthy Father    Mitral valve prolapse Paternal Aunt    Heart attack Paternal Grandfather        DECEASED   Dementia Maternal Grandmother    Healthy Son    Autism Daughter    Healthy Daughter    Healthy Daughter    Healthy Daughter    Past Surgical History:  Procedure Laterality Date   BREAST ENHANCEMENT SURGERY  10/13/2022  CESAREAN SECTION N/A 09/09/2016   Procedure: CESAREAN SECTION;  Surgeon: Tereso Newcomer, MD;  Location: [facility];  Service: Obstetrics;  Laterality: N/A;   DILITATION & CURRETTAGE/HYSTROSCOPY WITH NOVASURE ABLATION N/A 09/20/2022   Procedure: DILATATION & CURETTAGE/HYSTEROSCOPY WITH NOVASURE ABLATION;  Surgeon: Lennart Pall, MD;  Location: [facility];  Service: Gynecology;  Laterality: N/A;   NASAL SINUS SURGERY  07/25/2014   Social History   Social History Narrative   Not on file   There is no immunization history for the  selected administration types on file for this patient.   Objective: Vital Signs: There were no vitals taken for this visit.   Physical Exam   Musculoskeletal Exam: ***  CDAI Exam: CDAI Score: -- Patient Global: --; Provider Global: -- Swollen: --; Tender: -- Joint Exam 09/05/2023   No joint exam has been documented for this visit   There is currently no information documented on the homunculus. Go to the Rheumatology activity and complete the homunculus joint exam.  Investigation: No additional findings.  Imaging: No results found.  Recent Labs: Lab Results  Component Value Date   WBC 7.3 06/01/2023   HGB 13.7 06/01/2023   PLT 239 06/01/2023   NA 141 06/01/2023   K 4.8 06/01/2023   CL 104 06/01/2023   CO2 29 06/01/2023   GLUCOSE 74 06/01/2023   BUN 12 06/01/2023   CREATININE 0.71 06/01/2023   BILITOT 0.4 06/01/2023   ALKPHOS 61 03/30/2022   AST 17 06/01/2023   ALT 19 06/01/2023   PROT 7.0 06/01/2023   ALBUMIN 4.4 03/30/2022   CALCIUM 9.3 06/01/2023   GFRAA 132 10/20/2020   QFTBGOLDPLUS NEGATIVE 11/23/2022    Speciality Comments: humira 04/22,   PLQ, MTx, Enbrel  Contraception-vasectomy  Procedures:  No procedures performed Allergies: Other   Assessment / Plan:     Visit Diagnoses: Rheumatoid arthritis with rheumatoid factor of multiple sites without organ or systems involvement (HCC) - failed Humira, Enbrel, methotrexate and Plaquenil in the past:  High risk medication use - Orencia 125 mg subcutaneous injections once weekly and Arava 20 mg 1 tablet by mouth daily.  Other fatigue  Chronic pain of left ankle  Family hx of bicuspid aortic valve--FOB  Family history of congenital heart disease  History of preterm delivery  Anxiety and depression  Environmental allergies  Orders: No orders of the defined types were placed in this encounter.  No orders of the defined types were placed in this encounter.   Face-to-face time spent with  patient was *** minutes. Greater than 50% of time was spent in counseling and coordination of care.  Follow-Up Instructions: No follow-ups on file.   Gearldine Bienenstock, PA-C  Note - This record has been created using Dragon software.  Chart creation errors have been sought, but may not always  have been located. Such creation errors do not reflect on  the standard of medical care.

## 2023-08-26 DIAGNOSIS — Z419 Encounter for procedure for purposes other than remedying health state, unspecified: Secondary | ICD-10-CM | POA: Diagnosis not present

## 2023-08-29 NOTE — Progress Notes (Unsigned)
 Office Visit Note  Patient: Robin [name]             Date of Birth: [birth year]           MRN: [id number]             PCP: Donzetta Sprung, NP Referring: Donzetta Sprung, NP Visit Date: 09/12/2023 Occupation: @GUAROCC @  Subjective:  Medication monitoring   History of Present Illness: Robin [name] is a 38 y.o. female with history of seropositive rheumatoid arthritis.  Patient reports that she has been off of both [place] since early December 2024.  Patient states that she has been dealing with significant sinus and nasal congestion.  She has been receiving weekly allergy shots and remains on Claritin daily.  She will be following up with ENT for further evaluation.  According to the patient she has had sinus surgery about 8 years ago and is concerned she may have a recurrence of polyps.  Patient states that she has had recurrent infections in her household including bronchitis, flu, and RSV this winter as well.  Patient states that she has been on prednisone off and on as well as Bactrim to manage her symptoms.  Patient states that she has not taken antibiotic or prednisone in over 1 month.  She has been apprehensive to resume [place] or Arava due to the concern for infection.      Activities of Daily Living:  Patient reports morning stiffness for 0 minutes  Patient Denies nocturnal pain.  Difficulty dressing/grooming: Denies Difficulty climbing stairs: Denies Difficulty getting out of chair: Denies Difficulty using hands for taps, buttons, cutlery, and/or writing: Denies  Review of Systems  Constitutional:  Positive for fatigue.  HENT:  Positive for mouth dryness. Negative for mouth sores and nose dryness.        +Nasal congestion  Eyes:  Positive for itching. Negative for pain, visual disturbance and dryness.  Respiratory:  Negative for cough, hemoptysis, shortness of breath and difficulty breathing.   Cardiovascular:  Negative for chest pain, palpitations,  hypertension and swelling in legs/feet.  Gastrointestinal:  Negative for blood in stool, constipation and diarrhea.  Endocrine: Negative for increased urination.  Genitourinary:  Negative for painful urination.  Musculoskeletal:  Negative for joint pain, joint pain, joint swelling, myalgias, muscle weakness, morning stiffness, muscle tenderness and myalgias.  Skin:  Negative for color change, pallor, rash, hair loss, nodules/bumps, skin tightness, ulcers and sensitivity to sunlight.  Allergic/Immunologic: Negative for susceptible to infections.  Neurological:  Negative for dizziness, numbness, headaches and weakness.  Hematological:  Negative for swollen glands.  Psychiatric/Behavioral:  Negative for depressed mood and sleep disturbance. The patient is not nervous/anxious.     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 12/14/2020   Anxiety 09/21/2020   Rheumatoid arthritis involving multiple sites with positive rheumatoid factor (HCC) 01/14/2019   Normal labor 07/09/2018   Pregnancy with history of neonatal death 05/15/18   Depression affecting pregnancy 04/02/2018   Irregular periods 01/26/2018   Hereditary disease in family possibly affecting fetus, affecting management of mother, antepartum condition or complication, not applicable or unspecified fetus    History of preterm delivery 11/17/2017   Previous cesarean section 11/17/2017   Family history of congenital anomaly 11/17/2017   Family history of congenital heart disease 07/27/2015   Family history of mitral valve prolapse 07/27/2015  Family hx of bicuspid aortic valve--FOB 04/03/2012    Past Medical History:  Diagnosis Date   Abnormal Pap smear 2008   HAD HPV;HAD GENITAL WART REMOVED;LAST PAP 07/2010 WAS NORMAL   Anemia    FeSO4 SUPP TAKEN IN PAST   Asthma    mild asthma , only uses  inhaler when she is sick   Complication of anesthesia    pt states that she was difficult to wake up after previous surgery and had some nausea   COVID-19 01/25/2020   mild case, but has long-term effects, as of 08/2022 patient still has no taste or smell and gets sick easily   Daytime hypersomnia    Had sleep study in 06/2022. Negative for sleep apnea. Follows w/ Dr. Rosilyn Mings pulmonology and sleep medicine at [facility]   Depression    after the death of her son, was on meds then   Heart murmur    as a child   History of smoking 12/26/2011   Reports quit '12   Infection    UTI;CAN GET FREQ   PONV (postoperative nausea and vomiting)    Rheumatoid arthritis (HCC)    Follows w/ [facility].   RLS (restless legs syndrome)    Vaginal Pap smear, abnormal     Family History  Problem Relation Age of Onset   Mitral valve prolapse Mother        DIED @ 11 54 OF CONDITION   Healthy Father    Mitral valve prolapse Paternal Aunt    Heart attack Paternal Grandfather        DECEASED   Dementia Maternal Grandmother    Healthy Son    Autism Daughter    Healthy Daughter    Healthy Daughter    Healthy Daughter    Past Surgical History:  Procedure Laterality Date   BREAST ENHANCEMENT SURGERY  10/13/2022   CESAREAN SECTION N/A 09/09/2016   Procedure: CESAREAN SECTION;  Surgeon: Tereso Newcomer, MD;  Location: [facility];  Service: Obstetrics;  Laterality: N/A;   DILITATION & CURRETTAGE/HYSTROSCOPY WITH NOVASURE ABLATION N/A 09/20/2022   Procedure: DILATATION & CURETTAGE/HYSTEROSCOPY WITH NOVASURE ABLATION;  Surgeon: Lennart Pall, MD;  Location: [facility];  Service: Gynecology;  Laterality: N/A;   NASAL SINUS SURGERY  07/25/2014   Social History   Social History Narrative   Not on file   There is no immunization history for the selected administration types on file for this patient.   Objective: Vital Signs: Ht 5\' 6"  (1.676 m)   Wt 116 lb  9.6 oz (52.9 kg)   BMI 18.82 kg/m    Physical Exam Vitals and nursing note reviewed.  Constitutional:      Appearance: She is well-developed.  HENT:     Head: Normocephalic and atraumatic.  Eyes:     Conjunctiva/sclera: Conjunctivae normal.  Cardiovascular:     Rate and Rhythm: Normal rate and regular rhythm.     Heart sounds: Normal heart sounds.  Pulmonary:     Effort: Pulmonary effort is normal.     Breath sounds: Normal breath sounds.  Abdominal:     General: Bowel sounds are normal.     Palpations: Abdomen is soft.  Musculoskeletal:     Cervical back: Normal range of motion.  Lymphadenopathy:     Cervical: No cervical adenopathy.  Skin:    General: Skin is warm and dry.     Capillary Refill: Capillary refill takes less than 2 seconds.  Neurological:     Mental Status: She is alert and oriented to person, place, and time.  Psychiatric:        Behavior: Behavior normal.      Musculoskeletal Exam: C-spine, thoracic spine, and lumbar spine good ROM.  Shoulder joints, elbow joints, wrist joints, MCPs, PIPs, and DIPs good ROM with no synovitis.  Complete fist formation bilaterally.  Hip joints have good ROM with no groin pain.  Knee joints have good ROM with no warmth or effusion.  Ankle joints have good ROM with no tenderness or joint swelling.  CDAI Exam: CDAI Score: -- Patient Global: --; Provider Global: -- Swollen: --; Tender: -- Joint Exam 09/12/2023   No joint exam has been documented for this visit   There is currently no information documented on the homunculus. Go to the Rheumatology activity and complete the homunculus joint exam.  Investigation: No additional findings.  Imaging: No results found.  Recent Labs: Lab Results  Component Value Date   WBC 7.3 06/01/2023   HGB 13.7 06/01/2023   PLT 239 06/01/2023   NA 141 06/01/2023   K 4.8 06/01/2023   CL 104 06/01/2023   CO2 29 06/01/2023   GLUCOSE 74 06/01/2023   BUN 12 06/01/2023   CREATININE  0.71 06/01/2023   BILITOT 0.4 06/01/2023   ALKPHOS 61 03/30/2022   AST 17 06/01/2023   ALT 19 06/01/2023   PROT 7.0 06/01/2023   ALBUMIN 4.4 03/30/2022   CALCIUM 9.3 06/01/2023   GFRAA 132 10/20/2020   QFTBGOLDPLUS NEGATIVE 11/23/2022    Speciality Comments: humira 04/22,   PLQ, MTx, Enbrel  Contraception-vasectomy  Procedures:  No procedures performed Allergies: Other    Assessment / Plan:     Visit Diagnoses: Rheumatoid arthritis with rheumatoid factor of multiple sites without organ or systems involvement (HCC) - She failed Humira, Enbrel, methotrexate and Plaquenil in the past: She has no joint tenderness or synovitis on examination today.  She has not had any signs or symptoms of a rheumatoid arthritis flare since her last office visit.  Patient has been holding both [place] since early December 2024 due to recurrent infections and chronic sinusitis.  She has been under the care of asthma and allergy and has been getting weekly allergy shots.  She remains on Claritin as prescribed.  Patient states that her household has had recurrent infections including RSV, flu, and viral URIs.  She is not currently on antibiotics but was treated with a course of Bactrim and prednisone about 1 month ago.  Patient will be following up with ENT for further evaluation and to discuss the next steps.  Patient previously had sinus surgery about 8 years ago and is concerned she may have a recurrence of polyps.  Patient plans to continue to hold orencia and arava until cleared by ENT. She has not noticed any new or worsening symptoms while holding these medications.  She was advised to notify us if she develops signs or symptoms of a flare.  She will follow-up in the office in 3 months or sooner if needed.  High risk medication use - Currently holding Orencia 125 mg subcutaneous injections once weekly and Arava 20 mg 1 tablet by mouth daily.  Patient has been holding both [place] and [place] since  early December due to recurrent infections and chronic sinus congestion.  She has been receiving weekly allergy shots and remains on Claritin.  She will be following up with ENT for further evaluation.  Patient previously  had sinus surgery about 8 years ago and is concerned she may have a recurrence of polyps. CBC and CMP updated on 06/01/23. Orders for CBC and CMP released today.   TB gold negative on 11/23/22. Future order for TB gold placed today.  - Plan: COMPLETE METABOLIC PANEL WITH GFR, CBC with Differential/Platelet, QuantiFERON-TB Gold Plus  Screening for tuberculosis - Order for TB gold placed today.  Plan: QuantiFERON-TB Gold Plus  Other fatigue: Chronic, secondary to interrupted sleep at night.  Chronic pain of left ankle: No recurrence.  No tenderness or synovitis noted.  Other medical conditions are listed as follows:  Family hx of bicuspid aortic valve--FOB  History of preterm delivery  Anxiety and depression  Family history of congenital heart disease  Environmental allergies: She has been receiving weekly allergy shots.    Orders: Orders Placed This Encounter  Procedures   COMPLETE METABOLIC PANEL WITH GFR   CBC with Differential/Platelet   QuantiFERON-TB Gold Plus   No orders of the defined types were placed in this encounter.     Follow-Up Instructions: Return in about 5 months (around 02/09/2024) for Rheumatoid arthritis.   Gearldine Bienenstock, PA-C  Note - This record has been created using Dragon software.  Chart creation errors have been sought, but may not always  have been located. Such creation errors do not reflect on  the standard of medical care.

## 2023-09-05 ENCOUNTER — Ambulatory Visit: Payer: BC Managed Care – PPO | Admitting: Physician Assistant

## 2023-09-05 DIAGNOSIS — Z8279 Family history of other congenital malformations, deformations and chromosomal abnormalities: Secondary | ICD-10-CM

## 2023-09-05 DIAGNOSIS — G8929 Other chronic pain: Secondary | ICD-10-CM

## 2023-09-05 DIAGNOSIS — M0579 Rheumatoid arthritis with rheumatoid factor of multiple sites without organ or systems involvement: Secondary | ICD-10-CM

## 2023-09-05 DIAGNOSIS — Z79899 Other long term (current) drug therapy: Secondary | ICD-10-CM

## 2023-09-05 DIAGNOSIS — Z8774 Personal history of (corrected) congenital malformations of heart and circulatory system: Secondary | ICD-10-CM

## 2023-09-05 DIAGNOSIS — Z9109 Other allergy status, other than to drugs and biological substances: Secondary | ICD-10-CM

## 2023-09-05 DIAGNOSIS — F419 Anxiety disorder, unspecified: Secondary | ICD-10-CM

## 2023-09-05 DIAGNOSIS — Z8751 Personal history of pre-term labor: Secondary | ICD-10-CM

## 2023-09-05 DIAGNOSIS — R5383 Other fatigue: Secondary | ICD-10-CM

## 2023-09-12 ENCOUNTER — Ambulatory Visit: Payer: BC Managed Care – PPO | Attending: Physician Assistant | Admitting: Physician Assistant

## 2023-09-12 ENCOUNTER — Encounter: Payer: Self-pay | Admitting: Physician Assistant

## 2023-09-12 VITALS — BP 105/74 | HR 111 | Resp 13 | Ht 66.0 in | Wt 116.6 lb

## 2023-09-12 DIAGNOSIS — F32A Depression, unspecified: Secondary | ICD-10-CM

## 2023-09-12 DIAGNOSIS — Z8774 Personal history of (corrected) congenital malformations of heart and circulatory system: Secondary | ICD-10-CM

## 2023-09-12 DIAGNOSIS — M25572 Pain in left ankle and joints of left foot: Secondary | ICD-10-CM | POA: Diagnosis not present

## 2023-09-12 DIAGNOSIS — G8929 Other chronic pain: Secondary | ICD-10-CM

## 2023-09-12 DIAGNOSIS — Z79899 Other long term (current) drug therapy: Secondary | ICD-10-CM

## 2023-09-12 DIAGNOSIS — Z8279 Family history of other congenital malformations, deformations and chromosomal abnormalities: Secondary | ICD-10-CM

## 2023-09-12 DIAGNOSIS — R5383 Other fatigue: Secondary | ICD-10-CM

## 2023-09-12 DIAGNOSIS — M0579 Rheumatoid arthritis with rheumatoid factor of multiple sites without organ or systems involvement: Secondary | ICD-10-CM | POA: Diagnosis not present

## 2023-09-12 DIAGNOSIS — F419 Anxiety disorder, unspecified: Secondary | ICD-10-CM

## 2023-09-12 DIAGNOSIS — Z8751 Personal history of pre-term labor: Secondary | ICD-10-CM

## 2023-09-12 DIAGNOSIS — Z9109 Other allergy status, other than to drugs and biological substances: Secondary | ICD-10-CM

## 2023-09-12 DIAGNOSIS — Z111 Encounter for screening for respiratory tuberculosis: Secondary | ICD-10-CM

## 2023-09-13 ENCOUNTER — Other Ambulatory Visit: Payer: Self-pay | Admitting: Physician Assistant

## 2023-09-13 DIAGNOSIS — Z79899 Other long term (current) drug therapy: Secondary | ICD-10-CM

## 2023-09-13 DIAGNOSIS — M0579 Rheumatoid arthritis with rheumatoid factor of multiple sites without organ or systems involvement: Secondary | ICD-10-CM

## 2023-09-13 LAB — CBC WITH DIFFERENTIAL/PLATELET
Absolute Lymphocytes: 1658 {cells}/uL (ref 850–3900)
Absolute Monocytes: 816 {cells}/uL (ref 200–950)
Basophils Absolute: 102 {cells}/uL (ref 0–200)
Basophils Relative: 1.2 %
Eosinophils Absolute: 561 {cells}/uL — ABNORMAL HIGH (ref 15–500)
Eosinophils Relative: 6.6 %
HCT: 39.6 % (ref 35.0–45.0)
Hemoglobin: 13.1 g/dL (ref 11.7–15.5)
MCH: 31.1 pg (ref 27.0–33.0)
MCHC: 33.1 g/dL (ref 32.0–36.0)
MCV: 94.1 fL (ref 80.0–100.0)
MPV: 10.1 fL (ref 7.5–12.5)
Monocytes Relative: 9.6 %
Neutro Abs: 5364 {cells}/uL (ref 1500–7800)
Neutrophils Relative %: 63.1 %
Platelets: 270 10*3/uL (ref 140–400)
RBC: 4.21 10*6/uL (ref 3.80–5.10)
RDW: 12.2 % (ref 11.0–15.0)
Total Lymphocyte: 19.5 %
WBC: 8.5 10*3/uL (ref 3.8–10.8)

## 2023-09-13 LAB — COMPLETE METABOLIC PANEL WITH GFR
AG Ratio: 2.1 (calc) (ref 1.0–2.5)
ALT: 14 U/L (ref 6–29)
AST: 15 U/L (ref 10–30)
Albumin: 4.4 g/dL (ref 3.6–5.1)
Alkaline phosphatase (APISO): 54 U/L (ref 31–125)
BUN: 7 mg/dL (ref 7–25)
CO2: 28 mmol/L (ref 20–32)
Calcium: 9.2 mg/dL (ref 8.6–10.2)
Chloride: 106 mmol/L (ref 98–110)
Creat: 0.61 mg/dL (ref 0.50–0.97)
Globulin: 2.1 g/dL (ref 1.9–3.7)
Glucose, Bld: 78 mg/dL (ref 65–99)
Potassium: 4.4 mmol/L (ref 3.5–5.3)
Sodium: 140 mmol/L (ref 135–146)
Total Bilirubin: 0.3 mg/dL (ref 0.2–1.2)
Total Protein: 6.5 g/dL (ref 6.1–8.1)
eGFR: 119 mL/min/{1.73_m2} (ref >=60)

## 2023-09-13 NOTE — Telephone Encounter (Signed)
 Last Fill: 06/28/2023  Labs: 09/11/2022 Absolute eosinophils are elevated Rest of CBC WNL. CMP WNL.    TB Gold: 11/23/2022 Neg    Next Visit: 02/15/2024  Last Visit: 09/12/2023  DX: Rheumatoid arthritis with rheumatoid factor of multiple sites without organ or systems involvement   Current Dose per office note 09/12/2023: Orencia 125 mg subcutaneous injections once weekly   Okay to refill Orencia?

## 2023-09-13 NOTE — Progress Notes (Signed)
 Absolute eosinophils are elevated--under care of allergist and patient will be following up with ENT. Rest of CBC WNL.  CMP WNL.

## 2023-09-23 DIAGNOSIS — Z419 Encounter for procedure for purposes other than remedying health state, unspecified: Secondary | ICD-10-CM | POA: Diagnosis not present

## 2023-11-04 DIAGNOSIS — Z419 Encounter for procedure for purposes other than remedying health state, unspecified: Secondary | ICD-10-CM | POA: Diagnosis not present

## 2023-12-04 DIAGNOSIS — Z419 Encounter for procedure for purposes other than remedying health state, unspecified: Secondary | ICD-10-CM | POA: Diagnosis not present

## 2024-01-04 DIAGNOSIS — Z419 Encounter for procedure for purposes other than remedying health state, unspecified: Secondary | ICD-10-CM | POA: Diagnosis not present

## 2024-01-23 DIAGNOSIS — F909 Attention-deficit hyperactivity disorder, unspecified type: Secondary | ICD-10-CM

## 2024-01-23 HISTORY — DX: Attention-deficit hyperactivity disorder, unspecified type: F90.9

## 2024-01-23 NOTE — Progress Notes (Signed)
 Assessment and Plan   1. Attention deficit hyperactivity disorder (ADHD), combined type (Primary) -     predniSONE  (DELTASONE ) 10 mg tablet; 6 daily x 2 days, 5 daily x 2 days, 4 daily x 2 days, 3 daily x 2 days, 2 daily x 2 days,1 daily x 2 days then off, Normal -     sulfamethoxazole-trimethoprim  (BACTRIM DS) 800-160 mg per tablet; Take one tablet by mouth 2 (two) times daily for 10 days., Starting Tue 01/23/2024, Until Fri 02/02/2024, Normal -     amphetamine-dextroamphetamine (ADDERALL XR) 25 MG 24 hr capsule; Take one capsule (25 mg dose) by mouth every morning for 30 days. Max Daily Amount: 25 mg, Starting Tue 01/23/2024, Until Thu 02/22/2024, Normal -     amphetamine-dextroamphetamine (ADDERALL XR) 25 MG 24 hr capsule; Take one capsule (25 mg dose) by mouth every morning for 30 days. Max Daily Amount: 25 mg, Starting Thu 02/22/2024, Until Sat 03/23/2024, Normal -     amphetamine-dextroamphetamine (ADDERALL XR) 25 MG 24 hr capsule; Take one capsule (25 mg dose) by mouth every morning for 30 days. Max Daily Amount: 25 mg, Starting Sat 03/23/2024, Until Mon 04/22/2024, Normal     Risks, benefits, and alternatives of the medications and treatment plan prescribed today were discussed, and patient expressed understanding. Plan follow-up as discussed or as needed if any worsening symptoms or change in condition.         Subjective      Patient presents with  . ADHD  . Sinusitis    Chief complaint comments reviewed and discussed with patient in detail.    HPI   ADD/ADHD:  Robin [name] is in today for follow-up of her condition.  She is not currently taking medication for treatment.  He has been diagnosed but has been denied vyvanse, will discuss alternative medications.  Current symptoms:  impulsivity, inability to follow directions, inattention, low self-confidence, need for frequent task redirection, and none on medication. Current side effects of treatment:  none Compliance issues:  compliant  all of the time Other issues related to condition to be addressed:  alternative options for medication  The condition is not under adequate control.  Adjustment in treatment is warranted on the basis of symptoms.  Sinusitis This is a chronic condition. She has severe allergies and is on immunotherapy. She has been remodeling her kitchen ahd was recently exposed to insulation dust that has he sniffing, sneezing, she has PND, sinus pain and pressure. She gets sinus infections frequently but this has been ongoing for the last 2 weeks without improvement with OTC medications.    Reviewed and updated this visit by provider: Tobacco  Allergies  Meds  Problems  Med Hx  Surg Hx  Fam Hx      Review of Systems  Constitutional:  Negative for activity change and fever.  HENT:  Positive for congestion, sinus pressure and sinus pain. Negative for postnasal drip and rhinorrhea.   Respiratory:  Positive for chest tightness and shortness of breath. Negative for cough.   Cardiovascular:  Negative for chest pain.  Musculoskeletal:  Positive for myalgias.  Neurological:  Negative for light-headedness and headaches.  All other systems reviewed and are negative.         Objective   Vitals:   01/23/24 0946  BP: 100/64  Pulse: 89  Temp: 97 F (36.1 C)  Resp: 16  Height: 5' 5 (1.651 m)  Weight: 114 lb 3.2 oz (51.8 kg)  SpO2: 97%  BMI (Calculated): 19  PainSc: 0-No pain    Physical Exam Vitals and nursing note reviewed.  Constitutional:      General: She is not in acute distress.    Appearance: Normal appearance. She is not ill-appearing or diaphoretic.  HENT:     Head: Normocephalic.     Nose: Congestion and rhinorrhea present.  Cardiovascular:     Rate and Rhythm: Normal rate and regular rhythm.     Heart sounds: No murmur heard.    No friction rub. No gallop.  Musculoskeletal:        General: Normal range of motion.  Pulmonary:     Effort: Pulmonary effort is normal. No  respiratory distress.     Breath sounds: Normal breath sounds. No wheezing or rales.  Skin:    General: Skin is warm and dry.  Neurological:     Mental Status: She is alert and oriented to person, place, and time. Mental status is at baseline.  Psychiatric:        Behavior: Behavior normal.

## 2024-02-01 NOTE — Progress Notes (Signed)
 Office Visit Note  Patient: Robin [name]             Date of Birth: 1986/04/08           MRN: [id number]             PCP: Elizbeth Purchase, NP Referring: Elizbeth Purchase, NP Visit Date: 02/15/2024 Occupation: @GUAROCC @  Subjective:  Medication monitoring   History of Present Illness: Robin [name] is a 38 y.o. female with seropositive rheumatoid arthritis.  She returns today after her last visit in February 2025.  She states she was having recurrent sinus infections despite having the surgery for polyps.  She had been off Orencia  for the last 4 months.  She continues to take Arava  20 mg p.o. daily without any interruption.  She has not had a sinus infection in the last 1 month.  She has not had a flare of rheumatoid arthritis.  She denies any joint pain or joint swelling today.    Activities of Daily Living:  Patient reports morning stiffness for  none.   Patient Denies nocturnal pain.  Difficulty dressing/grooming: Denies Difficulty climbing stairs: Denies Difficulty getting out of chair: Denies Difficulty using hands for taps, buttons, cutlery, and/or writing: Denies  Review of Systems  Constitutional:  Negative for fatigue.  HENT:  Negative for mouth sores and mouth dryness.   Eyes:  Negative for dryness.  Respiratory:  Negative for shortness of breath.   Cardiovascular:  Negative for chest pain and palpitations.  Gastrointestinal:  Negative for blood in stool, constipation and diarrhea.  Endocrine: Negative for increased urination.  Genitourinary:  Negative for involuntary urination.  Musculoskeletal:  Negative for joint pain, gait problem, joint pain, joint swelling, myalgias, muscle weakness, morning stiffness, muscle tenderness and myalgias.  Skin:  Negative for color change, rash, hair loss and sensitivity to sunlight.  Allergic/Immunologic: Negative for susceptible to infections.  Neurological:  Negative for dizziness and headaches.  Hematological:   Negative for swollen glands.  Psychiatric/Behavioral:  Negative for depressed mood and sleep disturbance. The patient is not nervous/anxious.     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 12/14/2020   Anxiety 09/21/2020   Rheumatoid arthritis involving multiple sites with positive rheumatoid factor (HCC) 01/14/2019   Normal labor 07/09/2018   Pregnancy with history of neonatal death 2018-05-19   Depression affecting pregnancy 04/02/2018   Irregular periods 01/26/2018   Hereditary disease in family possibly affecting fetus, affecting management of mother, antepartum condition or complication, not applicable or unspecified fetus    History of preterm delivery 11/17/2017   Previous cesarean section 11/17/2017   Family history of congenital anomaly 11/17/2017   Family history of congenital heart disease 07/27/2015   Family history of mitral valve prolapse 07/27/2015   Family hx of bicuspid aortic valve--FOB 04/03/2012    Past Medical History:  Diagnosis Date   Abnormal Pap smear 2008   HAD HPV;HAD GENITAL WART REMOVED;LAST PAP 07/2010 WAS NORMAL   ADHD 01/2024   Anemia    FeSO4 SUPP TAKEN IN PAST   Asthma    mild asthma , only uses inhaler when she is sick   Complication of anesthesia    pt states that she was difficult to wake up after previous surgery and had some nausea   COVID-19 01/25/2020   mild case, but has long-term effects, as of  08/2022 patient still has no taste or smell and gets sick easily   Daytime hypersomnia    Had sleep study in 06/2022. Negative for sleep apnea. Follows w/ Dr. Gregory Bunkers pulmonology and sleep medicine at [facility]   Depression    after the death of her son, was on meds then   Heart murmur    as a child   History of smoking 12/26/2011   Reports quit '12   Infection    UTI;CAN GET FREQ   PONV (postoperative  nausea and vomiting)    Rheumatoid arthritis (HCC)    Follows w/ [facility].   RLS (restless legs syndrome)    Vaginal Pap smear, abnormal     Family History  Problem Relation Age of Onset   Mitral valve prolapse Mother        DIED @ 52 57 OF CONDITION   Healthy Father    Mitral valve prolapse Paternal Aunt    Heart attack Paternal Grandfather        DECEASED   Dementia Maternal Grandmother    Healthy Son    Autism Daughter    Healthy Daughter    Healthy Daughter    Healthy Daughter    Past Surgical History:  Procedure Laterality Date   BREAST ENHANCEMENT SURGERY  10/13/2022   CESAREAN SECTION N/A 09/09/2016   Procedure: CESAREAN SECTION;  Surgeon: Gloris DELENA Hugger, MD;  Location: [facility];  Service: Obstetrics;  Laterality: N/A;   DILITATION & CURRETTAGE/HYSTROSCOPY WITH NOVASURE ABLATION N/A 09/20/2022   Procedure: DILATATION & CURETTAGE/HYSTEROSCOPY WITH NOVASURE ABLATION;  Surgeon: Erik Kieth BROCKS, MD;  Location: [facility];  Service: Gynecology;  Laterality: N/A;   NASAL SINUS SURGERY  07/25/2014   Social History   Social History Narrative   Not on file   There is no immunization history for the selected administration types on file for this patient.   Objective: Vital Signs: BP 109/75 (BP Location: Left Arm, Patient Position: Sitting, Cuff Size: Normal)   Pulse (!) 102   Resp 16   Ht 5' 5.5 (1.664 m)   Wt 109 lb 12.8 oz (49.8 kg)   BMI 17.99 kg/m    Physical Exam Vitals and nursing note reviewed.  Constitutional:      Appearance: She is well-developed.  HENT:     Head: Normocephalic and atraumatic.  Eyes:     Conjunctiva/sclera: Conjunctivae normal.  Cardiovascular:     Rate and Rhythm: Normal rate and regular rhythm.     Heart sounds: Normal heart sounds.  Pulmonary:     Effort: Pulmonary effort is normal.     Breath sounds: Normal breath sounds.  Abdominal:     General: Bowel sounds are normal.      Palpations: Abdomen is soft.  Musculoskeletal:     Cervical back: Normal range of motion.  Lymphadenopathy:     Cervical: No cervical adenopathy.  Skin:    General: Skin is warm and dry.     Capillary Refill: Capillary refill takes less than 2 seconds.  Neurological:     Mental Status: She is alert and oriented to person, place, and time.  Psychiatric:        Behavior: Behavior normal.      Musculoskeletal Exam: Cervical, thoracic and lumbar spine with good range of motion.  Shoulder joints, elbow joints, wrist joints, MCPs PIPs and DIPs were in good range of motion.  She had bilateral second MCP thickening with no synovitis.  Hip  joints and knee joints in good range of motion without any warmth swelling or effusion.  There was no tenderness over her ankles or MTPs.  CDAI Exam: CDAI Score: -- Patient Global: 10 / 100; Provider Global: 10 / 100 Swollen: --; Tender: -- Joint Exam 02/15/2024   No joint exam has been documented for this visit   There is currently no information documented on the homunculus. Go to the Rheumatology activity and complete the homunculus joint exam.  Investigation: No additional findings.  Imaging: No results found.  Recent Labs: Lab Results  Component Value Date   WBC 8.5 09/12/2023   HGB 13.1 09/12/2023   PLT 270 09/12/2023   NA 140 09/12/2023   K 4.4 09/12/2023   CL 106 09/12/2023   CO2 28 09/12/2023   GLUCOSE 78 09/12/2023   BUN 7 09/12/2023   CREATININE 0.61 09/12/2023   BILITOT 0.3 09/12/2023   ALKPHOS 61 03/30/2022   AST 15 09/12/2023   ALT 14 09/12/2023   PROT 6.5 09/12/2023   ALBUMIN 4.4 03/30/2022   CALCIUM  9.2 09/12/2023   GFRAA 132 10/20/2020   QFTBGOLDPLUS NEGATIVE 11/23/2022    Speciality Comments: humira  04/22,   PLQ, MTx, Enbrel   Contraception-vasectomy  Procedures:  No procedures performed Allergies: Other   Assessment / Plan:     Visit Diagnoses: Rheumatoid arthritis with rheumatoid factor of multiple sites  without organ or systems involvement (HCC) - -She failed Humira , Enbrel , methotrexate and Plaquenil in the past:holding both Orencia  and Arava  since 12/24 due to recurrent infections chronic sinusitis.  Patient states she resumed Arava  at 4 months ago.  She is not taking Orencia .  She has not had any flares of rheumatoid arthritis.  No synovitis was noted on the examination.  Synovial thickening was noted over bilateral second MCPs.  I discussed possibility of reducing the dose of Arava  to 10 mg p.o. daily if needed.  I also discussed possible use of minocycline in the future.  Prescription refill for Arava  20 mg p.o. daily was sent today.  High risk medication use -she is on Arava  20 mg p.o. daily.  ( Orencia  125 mg subcutaneous injections once weekly -on hold)   Consider minocycline - Plan: CBC with Differential/Platelet, Comprehensive metabolic panel with GFR today and every 3 months.  Information reimmunization was placed in the AVS.  She was advised to hold Arava  if she develops an infection and resume after the infection resolves.  Recurrent infections - Recurrent upper respiratory and sinus infection.  Patient states she has not had an infection in the last 12 months.  She is followed by ENT.  Dyslipidemia - Plan: Lipid panel  Environmental allergies-she has environmental allergies.  She is also allergic to the dog.  She is on Zyrtec.  She also gets allergy shots.  Other fatigue-improved.  Anxiety and depression  Family hx of bicuspid aortic valve--FOB  Orders: Orders Placed This Encounter  Procedures   CBC with Differential/Platelet   Comprehensive metabolic panel with GFR   Lipid panel   Meds ordered this encounter  Medications   leflunomide  (ARAVA ) 20 MG tablet    Sig: Take 1 tablet (20 mg total) by mouth daily.    Dispense:  90 tablet    Refill:  0     Follow-Up Instructions: Return in about 5 months (around 07/17/2024) for Rheumatoid arthritis.   Maya Nash,  MD  Note - This record has been created using Animal nutritionist.  Chart creation errors have been sought, but may  not always  have been located. Such creation errors do not reflect on  the standard of medical care.

## 2024-02-03 DIAGNOSIS — Z419 Encounter for procedure for purposes other than remedying health state, unspecified: Secondary | ICD-10-CM | POA: Diagnosis not present

## 2024-02-15 ENCOUNTER — Ambulatory Visit: Payer: BC Managed Care – PPO | Attending: Rheumatology | Admitting: Rheumatology

## 2024-02-15 ENCOUNTER — Encounter: Payer: Self-pay | Admitting: Rheumatology

## 2024-02-15 VITALS — BP 109/75 | HR 102 | Resp 16 | Ht 65.5 in | Wt 109.8 lb

## 2024-02-15 DIAGNOSIS — E785 Hyperlipidemia, unspecified: Secondary | ICD-10-CM | POA: Diagnosis not present

## 2024-02-15 DIAGNOSIS — R5383 Other fatigue: Secondary | ICD-10-CM

## 2024-02-15 DIAGNOSIS — B999 Unspecified infectious disease: Secondary | ICD-10-CM | POA: Diagnosis not present

## 2024-02-15 DIAGNOSIS — M0579 Rheumatoid arthritis with rheumatoid factor of multiple sites without organ or systems involvement: Secondary | ICD-10-CM | POA: Diagnosis not present

## 2024-02-15 DIAGNOSIS — Z79899 Other long term (current) drug therapy: Secondary | ICD-10-CM

## 2024-02-15 DIAGNOSIS — F419 Anxiety disorder, unspecified: Secondary | ICD-10-CM

## 2024-02-15 DIAGNOSIS — G8929 Other chronic pain: Secondary | ICD-10-CM

## 2024-02-15 DIAGNOSIS — F32A Depression, unspecified: Secondary | ICD-10-CM

## 2024-02-15 DIAGNOSIS — Z8774 Personal history of (corrected) congenital malformations of heart and circulatory system: Secondary | ICD-10-CM

## 2024-02-15 DIAGNOSIS — Z9109 Other allergy status, other than to drugs and biological substances: Secondary | ICD-10-CM

## 2024-02-15 LAB — LIPID PANEL
Cholesterol: 214 mg/dL — ABNORMAL HIGH (ref <200)
HDL: 58 mg/dL (ref >=50)
LDL Cholesterol (Calc): 139 mg/dL — ABNORMAL HIGH
Non-HDL Cholesterol (Calc): 156 mg/dL — ABNORMAL HIGH (ref <130)
Total CHOL/HDL Ratio: 3.7 (calc) (ref <5.0)
Triglycerides: 80 mg/dL (ref <150)

## 2024-02-15 LAB — CBC WITH DIFFERENTIAL/PLATELET
Absolute Lymphocytes: 1264 {cells}/uL (ref 850–3900)
Absolute Monocytes: 436 {cells}/uL (ref 200–950)
Basophils Absolute: 59 {cells}/uL (ref 0–200)
Basophils Relative: 1.2 %
Eosinophils Absolute: 338 {cells}/uL (ref 15–500)
Eosinophils Relative: 6.9 %
HCT: 41.1 % (ref 35.0–45.0)
Hemoglobin: 13.8 g/dL (ref 11.7–15.5)
MCH: 32 pg (ref 27.0–33.0)
MCHC: 33.6 g/dL (ref 32.0–36.0)
MCV: 95.4 fL (ref 80.0–100.0)
MPV: 10.3 fL (ref 7.5–12.5)
Monocytes Relative: 8.9 %
Neutro Abs: 2803 {cells}/uL (ref 1500–7800)
Neutrophils Relative %: 57.2 %
Platelets: 232 Thousand/uL (ref 140–400)
RBC: 4.31 Million/uL (ref 3.80–5.10)
RDW: 11.4 % (ref 11.0–15.0)
Total Lymphocyte: 25.8 %
WBC: 4.9 Thousand/uL (ref 3.8–10.8)

## 2024-02-15 LAB — COMPREHENSIVE METABOLIC PANEL WITH GFR
AG Ratio: 2 (calc) (ref 1.0–2.5)
ALT: 15 U/L (ref 6–29)
AST: 18 U/L (ref 10–30)
Albumin: 4.6 g/dL (ref 3.6–5.1)
Alkaline phosphatase (APISO): 50 U/L (ref 31–125)
BUN: 9 mg/dL (ref 7–25)
CO2: 25 mmol/L (ref 20–32)
Calcium: 9.4 mg/dL (ref 8.6–10.2)
Chloride: 105 mmol/L (ref 98–110)
Creat: 0.65 mg/dL (ref 0.50–0.97)
Globulin: 2.3 g/dL (ref 1.9–3.7)
Glucose, Bld: 86 mg/dL (ref 65–99)
Potassium: 4.2 mmol/L (ref 3.5–5.3)
Sodium: 139 mmol/L (ref 135–146)
Total Bilirubin: 0.5 mg/dL (ref 0.2–1.2)
Total Protein: 6.9 g/dL (ref 6.1–8.1)
eGFR: 116 mL/min/1.73m2 (ref >=60)

## 2024-02-15 MED ORDER — LEFLUNOMIDE 20 MG PO TABS: 20.0000 mg | ORAL_TABLET | Freq: Every day | ORAL | 0 refills | Status: AC

## 2024-02-15 NOTE — Patient Instructions (Signed)
 Standing Labs We placed an order today for your standing lab work.   Please have your standing labs drawn in  October and every 3 months  Please have your labs drawn 2 weeks prior to your appointment so that the provider can discuss your lab results at your appointment, if possible.  Please note that you may see your imaging and lab results in MyChart before we have reviewed them. We will contact you once all results are reviewed. Please allow our office up to 72 hours to thoroughly review all of the results before contacting the office for clarification of your results.  WALK-IN LAB HOURS  Monday through Thursday from 8:00 am -12:30 pm and 1:00 pm-4:30 pm and Friday from 8:00 am-12:00 pm.  Patients with office visits requiring labs will be seen before walk-in labs.  You may encounter longer than normal wait times. Please allow additional time. Wait times may be shorter on  Monday and Thursday afternoons.  We do not book appointments for walk-in labs. We appreciate your patience and understanding with our staff.   Labs are drawn by [facility]. Please bring your co-pay at the time of your lab draw.  You may receive a bill from [facility] for your lab work.  Please note if you are on Hydroxychloroquine and and an order has been placed for a Hydroxychloroquine level,  you will need to have it drawn 4 hours or more after your last dose.  If you wish to have your labs drawn at another location, please call the office 24 hours in advance so we can fax the orders.  The office is located at [address], [place], [place] [address]   If you have any questions regarding directions or hours of operation,  please call [phone].   As a reminder, please drink plenty of water  prior to coming for your lab work. Thanks!  Vaccines You are taking a medication(s) that can suppress your immune system.  The following immunizations are recommended: Flu annually Covid-19  Td/Tdap (tetanus,  diphtheria, pertussis) every 10 years Pneumonia (Prevnar 15 then Pneumovax 23 at least 1 year apart.  Alternatively, can take Prevnar 20 without needing additional dose) Shingrix: 2 doses from 4 weeks to 6 months apart  Please check with your PCP to make sure you are up to date.   If you have signs or symptoms of an infection or start antibiotics: First, call your PCP for workup of your infection. Hold your medication through the infection, until you complete your antibiotics, and until symptoms resolve if you take the following: Injectable medication (Actemra, Benlysta, Cimzia, Cosentyx, Enbrel, Humira, Kevzara, Orencia , Remicade, Simponi, Stelara, Taltz, Tremfya) Methotrexate  Leflunomide  (Arava ) Mycophenolate (Cellcept) Xeljanz, Olumiant, or Rinvoq

## 2024-02-16 ENCOUNTER — Ambulatory Visit: Payer: Self-pay | Admitting: Rheumatology

## 2024-02-16 NOTE — Progress Notes (Signed)
CBC and CMP are normal.  LDL is elevated.  Please forward results to her PCP.

## 2024-03-05 DIAGNOSIS — Z419 Encounter for procedure for purposes other than remedying health state, unspecified: Secondary | ICD-10-CM | POA: Diagnosis not present

## 2024-04-05 DIAGNOSIS — Z419 Encounter for procedure for purposes other than remedying health state, unspecified: Secondary | ICD-10-CM | POA: Diagnosis not present

## 2024-05-05 DIAGNOSIS — Z419 Encounter for procedure for purposes other than remedying health state, unspecified: Secondary | ICD-10-CM | POA: Diagnosis not present

## 2024-06-24 NOTE — Progress Notes (Unsigned)
 Office Visit Note  Patient: Robin [name]             Date of Birth: [birth year]           MRN: [id number]             PCP: Elizbeth Purchase, NP Referring: Elizbeth Purchase, NP Visit Date: 07/08/2024 Occupation: Data Unavailable  Subjective:  New diagnosis of MCAS  History of Present Illness: Robin [name] is a 38 y.o. female with history of rheumatoid arthritis.  Patient is prescribed arava  20 mg 1 tablet by mouth daily.  Patient reports that she had a flare involving multiple joints in early November 2025.  Patient states that around that time she was also recovering from bronchitis which took about 3 weeks.  Patient states that she was holding Arava  while recovering from the infection which she feels also contributed to the flare.  Patient states that she took a course of prednisone  and was also given an IM steroid injection which helped alleviate her symptoms.  She has resumed taking Areva and her symptoms have subsided.  She denies any joint swelling currently. 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  Patient states that she has been experiencing recurrent urticaria and brought several photos today.  She is currently in the process of trying to schedule an appointment with her allergist.  She has been taking zyrtec and pepcid BID for symptomatic relief.  She has been trying to identify triggers to avoid and has also been going to therapy for the past 6 months to try to regulate her nervous system.   Activities of Daily Living:  Patient reports morning stiffness for  none.   Patient Denies nocturnal pain.  Difficulty dressing/grooming: Denies Difficulty climbing stairs: Denies Difficulty getting out of chair: Denies Difficulty using hands for taps, buttons, cutlery, and/or writing: Denies  Review of Systems  Constitutional:  Negative for fatigue.  HENT:  Negative for mouth sores and mouth dryness.   Eyes:  Negative for dryness.   Respiratory:  Negative for shortness of breath.   Cardiovascular:  Negative for chest pain and palpitations.  Gastrointestinal:  Negative for blood in stool, constipation and diarrhea.  Endocrine: Negative for increased urination.  Genitourinary:  Negative for involuntary urination.  Musculoskeletal:  Negative for joint pain, gait problem, joint pain, joint swelling, myalgias, muscle weakness, morning stiffness, muscle tenderness and myalgias.  Skin:  Positive for rash and hair loss. Negative for color change and sensitivity to sunlight.  Allergic/Immunologic: Positive for susceptible to infections.  Neurological:  Positive for headaches. Negative for dizziness.  Hematological:  Negative for swollen glands.  Psychiatric/Behavioral:  Positive for sleep disturbance. Negative for depressed mood. The patient is not nervous/anxious.     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 12/14/2020   Anxiety 09/21/2020   Rheumatoid arthritis involving multiple sites with positive rheumatoid factor (HCC) 01/14/2019   Normal labor 07/09/2018   Pregnancy with history of neonatal death 05/27/18   Depression affecting pregnancy 04/02/2018   Irregular periods 01/26/2018   Hereditary disease in family possibly affecting fetus, affecting management of mother, antepartum condition or complication, not applicable or unspecified fetus    History of preterm delivery 11/17/2017   Previous cesarean section 11/17/2017   Family history of congenital anomaly 11/17/2017   Family history of  congenital heart disease 07/27/2015   Family history of mitral valve prolapse 07/27/2015   Family hx of bicuspid aortic valve--FOB 04/03/2012    Past Medical History:  Diagnosis Date   Abnormal Pap smear 2008   HAD HPV;HAD GENITAL WART REMOVED;LAST PAP 07/2010 WAS NORMAL   ADHD 01/2024    Anemia    FeSO4 SUPP TAKEN IN PAST   Asthma    mild asthma , only uses inhaler when she is sick   Complication of anesthesia    pt states that she was difficult to wake up after previous surgery and had some nausea   COVID-19 01/25/2020   mild case, but has long-term effects, as of 08/2022 patient still has no taste or smell and gets sick easily   Daytime hypersomnia    Had sleep study in 06/2022. Negative for sleep apnea. Follows w/ Dr. 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   RLS (restless legs syndrome)    Vaginal Pap smear, abnormal     Family History  Problem Relation Age of Onset   Mitral valve prolapse Mother        DIED @ 70 24 OF CONDITION   Healthy Father    Mitral valve prolapse Paternal Aunt    Heart attack Paternal Grandfather        DECEASED   Dementia Maternal Grandmother    Healthy Son    Autism Daughter    Healthy Daughter    Healthy Daughter    Healthy Daughter    Past Surgical History:  Procedure Laterality Date   BREAST ENHANCEMENT SURGERY  10/13/2022   CESAREAN SECTION N/A 09/09/2016   Procedure: CESAREAN SECTION;  Surgeon: Gloris DELENA Hugger, MD;  Location: [facility];  Service: Obstetrics;  Laterality: N/A;   DILITATION & CURRETTAGE/HYSTROSCOPY WITH NOVASURE ABLATION N/A 09/20/2022   Procedure: DILATATION & CURETTAGE/HYSTEROSCOPY WITH NOVASURE ABLATION;  Surgeon: Erik Kieth BROCKS, MD;  Location: [facility];  Service: Gynecology;  Laterality: N/A;   NASAL SINUS SURGERY  07/25/2014   Social History   Tobacco Use   Smoking status: Former    Current packs/day: 0.00    Average  packs/day: 0.5 packs/day for 6.0 years (3.0 ttl pk-yrs)    Types: Cigarettes    Start date: 06/26/2005    Quit date: 06/27/2011    Years since quitting: 13.0    Passive exposure: Never   Smokeless tobacco: Never  Vaping Use   Vaping status: Former   Devices: only vaped nicotine for about 3 months  Substance Use Topics   Alcohol use: Yes    Comment: occ   Drug use: No   Social History   Social History Narrative   Not on file     There is no immunization history for the selected administration types on file for this patient.   Objective: Vital Signs: BP 113/76   Pulse 88   Temp 98.4 F (36.9 C)   Resp 16   Ht 5' 5.5 (1.664 m)   Wt 113 lb 12.8 oz (51.6 kg)   BMI  18.65 kg/m    Physical Exam Vitals and nursing note reviewed.  Constitutional:      Appearance: She is well-developed.  HENT:     Head: Normocephalic and atraumatic.  Eyes:     Conjunctiva/sclera: Conjunctivae normal.  Cardiovascular:     Rate and Rhythm: Normal rate and regular rhythm.     Heart sounds: Normal heart sounds.  Pulmonary:     Effort: Pulmonary effort is normal.     Breath sounds: Normal breath sounds.  Abdominal:     General: Bowel sounds are normal.     Palpations: Abdomen is soft.  Musculoskeletal:     Cervical back: Normal range of motion.  Lymphadenopathy:     Cervical: No cervical adenopathy.  Skin:    General: Skin is warm and dry.     Capillary Refill: Capillary refill takes less than 2 seconds.  Neurological:     Mental Status: She is alert and oriented to person, place, and time.  Psychiatric:        Behavior: Behavior normal.      Musculoskeletal Exam: C-spine, thoracic spine, lumbar spine have good range of motion.  No midline spinal tenderness.  No SI joint tenderness.  Shoulder joints, elbow joints, wrist joints, MCPs, PIPs, DIPs have good range of motion with no synovitis.  Synovial thickening of the right 2nd MCP joint.  complete fist formation bilaterally.  Hip  joints have good range of motion with no groin pain.  Knee joints have good range of motion no warmth or effusion.  Ankle joints have good range of motion no tenderness or joint swelling.  No evidence of Achilles tendinitis or plantar fasciitis.   CDAI Exam: CDAI Score: -- Patient Global: --; Provider Global: -- Swollen: --; Tender: -- Joint Exam 07/08/2024   No joint exam has been documented for this visit   There is currently no information documented on the homunculus. Go to the Rheumatology activity and complete the homunculus joint exam.  Investigation: No additional findings.  Imaging: No results found.  Recent Labs: Lab Results  Component Value Date   WBC 4.9 02/15/2024   HGB 13.8 02/15/2024   PLT 232 02/15/2024   NA 139 02/15/2024   K 4.2 02/15/2024   CL 105 02/15/2024   CO2 25 02/15/2024   GLUCOSE 86 02/15/2024   BUN 9 02/15/2024   CREATININE 0.65 02/15/2024   BILITOT 0.5 02/15/2024   ALKPHOS 61 03/30/2022   AST 18 02/15/2024   ALT 15 02/15/2024   PROT 6.9 02/15/2024   ALBUMIN 4.4 03/30/2022   CALCIUM  9.4 02/15/2024   GFRAA 132 10/20/2020   QFTBGOLDPLUS NEGATIVE 11/23/2022    Speciality Comments: humira  04/22,   PLQ, MTx, Enbrel   Contraception-vasectomy  Procedures:  No procedures performed Allergies: Other   Assessment / Plan:     Visit Diagnoses: Rheumatoid arthritis with rheumatoid factor of multiple sites without organ or systems involvement (HCC) - failed Humira , Enbrel , methotrexate and Plaquenil in the past:  She has no synovitis on examination today.  She is currently taking Arava  20 mg 1 tablet by mouth daily.  She is tolerating Arava  without any side effects. Patient had a flare of rheumatoid arthritis involving multiple joints in early November while holding Arava  while recovering from bronchitis.  Her symptoms have resolved since reinitiating Arava  and taking a prednisone  taper and IM steroid injection.  She has no inflammation on  examination today.  She was advised notify us  if she develops any signs or symptoms of  a flare.  Plan to continue to hold Orencia  due to history of recurrent infections.   She will follow-up in the office in 5 months or sooner if needed.- Plan: Lipid panel  High risk medication use -Arava  20 mg 1 tablet by mouth daily.  ( Orencia  125 mg subcutaneous injections once weekly -on hold due to recurrent infections in the past)   Could consider minocycline in the future if needed.  CBC and CMP updated on 02/15/24.  Orders for CBC and CMP released today.  Lipid panel released today.  Discussed the importance of holding arava  if she develops signs or symptoms of an infection and to resume once the infection has completely cleared.   - Plan: CBC with Differential/Platelet, Comprehensive metabolic panel with GFR, Lipid panel  Recurrent infections - Recurrent upper respiratory infections/bronchitis. Followed by ENT and allergist.   Diagnosed with bronchitis in early November 2025-took about 3 weeks to recover-held arava  during that time.  Do not plan to reinitiate Orencia  at this time.   Dyslipidemia - Plan to obtain lipid panel today. Plan: Lipid panel  Chronic pain of left ankle: Not currently symptomatic.  No tenderness or inflammation noted along the joint line.  No synovitis noted.   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  She has been taking Pepcid twice daily and Zyrtec twice daily for symptomatic relief.  She uses triamcinolone  cream topically as needed to also help manage recurrent urticaria.  She is currently awaiting appointment with neurologist for further evaluation.  She has been trying to identify triggers to avoid.  She has also been going to therapy for the past 6 months to help regulate  her nervous system and manage stress levels.   Other fatigue  Anxiety and depression  Family hx of bicuspid aortic valve--FOB Orders: Orders Placed This Encounter  Procedures   CBC with Differential/Platelet   Comprehensive metabolic panel with GFR   Lipid panel   No orders of the defined types were placed in this encounter.    Follow-Up Instructions: Return in about 5 months (around 12/06/2024) for Rheumatoid arthritis.   Waddell CHRISTELLA Craze, PA-C  Note - This record has been created using Dragon software.  Chart creation errors have been sought, but may not always  have been located. Such creation errors do not reflect on  the standard of medical care.

## 2024-07-08 ENCOUNTER — Ambulatory Visit: Admitting: Physician Assistant

## 2024-07-08 ENCOUNTER — Encounter: Payer: Self-pay | Admitting: Physician Assistant

## 2024-07-08 VITALS — BP 113/76 | HR 88 | Temp 98.4°F | Resp 16 | Ht 65.5 in | Wt 113.8 lb

## 2024-07-08 DIAGNOSIS — G8929 Other chronic pain: Secondary | ICD-10-CM

## 2024-07-08 DIAGNOSIS — M25572 Pain in left ankle and joints of left foot: Secondary | ICD-10-CM

## 2024-07-08 DIAGNOSIS — E785 Hyperlipidemia, unspecified: Secondary | ICD-10-CM | POA: Diagnosis not present

## 2024-07-08 DIAGNOSIS — B999 Unspecified infectious disease: Secondary | ICD-10-CM | POA: Diagnosis not present

## 2024-07-08 DIAGNOSIS — Z79899 Other long term (current) drug therapy: Secondary | ICD-10-CM

## 2024-07-08 DIAGNOSIS — Z8751 Personal history of pre-term labor: Secondary | ICD-10-CM | POA: Diagnosis not present

## 2024-07-08 DIAGNOSIS — Z8774 Personal history of (corrected) congenital malformations of heart and circulatory system: Secondary | ICD-10-CM | POA: Diagnosis not present

## 2024-07-08 DIAGNOSIS — R5383 Other fatigue: Secondary | ICD-10-CM

## 2024-07-08 DIAGNOSIS — M0579 Rheumatoid arthritis with rheumatoid factor of multiple sites without organ or systems involvement: Secondary | ICD-10-CM | POA: Diagnosis not present

## 2024-07-08 DIAGNOSIS — Z8279 Family history of other congenital malformations, deformations and chromosomal abnormalities: Secondary | ICD-10-CM

## 2024-07-08 DIAGNOSIS — Z9109 Other allergy status, other than to drugs and biological substances: Secondary | ICD-10-CM | POA: Diagnosis not present

## 2024-07-08 DIAGNOSIS — F32A Depression, unspecified: Secondary | ICD-10-CM

## 2024-07-08 DIAGNOSIS — F419 Anxiety disorder, unspecified: Secondary | ICD-10-CM | POA: Diagnosis not present

## 2024-07-09 ENCOUNTER — Ambulatory Visit: Payer: Self-pay | Admitting: Physician Assistant

## 2024-07-09 LAB — CBC WITH DIFFERENTIAL/PLATELET
Absolute Lymphocytes: 1224 {cells}/uL (ref 850–3900)
Absolute Monocytes: 432 {cells}/uL (ref 200–950)
Basophils Absolute: 62 {cells}/uL (ref 0–200)
Basophils Relative: 1.3 %
Eosinophils Absolute: 230 {cells}/uL (ref 15–500)
Eosinophils Relative: 4.8 %
HCT: 40.1 % (ref 35.9–46.0)
Hemoglobin: 13.3 g/dL (ref 11.7–15.5)
MCH: 32.1 pg (ref 27.0–33.0)
MCHC: 33.2 g/dL (ref 31.6–35.4)
MCV: 96.9 fL (ref 81.4–101.7)
MPV: 10.3 fL (ref 7.5–12.5)
Monocytes Relative: 9 %
Neutro Abs: 2851 {cells}/uL (ref 1500–7800)
Neutrophils Relative %: 59.4 %
Platelets: 257 Thousand/uL (ref 140–400)
RBC: 4.14 Million/uL (ref 3.80–5.10)
RDW: 12.1 % (ref 11.0–15.0)
Total Lymphocyte: 25.5 %
WBC: 4.8 Thousand/uL (ref 3.8–10.8)

## 2024-07-09 LAB — COMPREHENSIVE METABOLIC PANEL WITH GFR
AG Ratio: 2.1 (calc) (ref 1.0–2.5)
ALT: 14 U/L (ref 6–29)
AST: 15 U/L (ref 10–30)
Albumin: 4.8 g/dL (ref 3.6–5.1)
Alkaline phosphatase (APISO): 43 U/L (ref 31–125)
BUN: 7 mg/dL (ref 7–25)
CO2: 24 mmol/L (ref 20–32)
Calcium: 9.5 mg/dL (ref 8.6–10.2)
Chloride: 106 mmol/L (ref 98–110)
Creat: 0.69 mg/dL (ref 0.50–0.97)
Globulin: 2.3 g/dL (ref 1.9–3.7)
Glucose, Bld: 79 mg/dL (ref 65–99)
Potassium: 4.6 mmol/L (ref 3.5–5.3)
Sodium: 139 mmol/L (ref 135–146)
Total Bilirubin: 0.5 mg/dL (ref 0.2–1.2)
Total Protein: 7.1 g/dL (ref 6.1–8.1)
eGFR: 115 mL/min/1.73m2 (ref >=60)

## 2024-07-09 LAB — LIPID PANEL
Cholesterol: 199 mg/dL (ref <200)
HDL: 60 mg/dL (ref >=50)
LDL Cholesterol (Calc): 122 mg/dL — ABNORMAL HIGH
Non-HDL Cholesterol (Calc): 139 mg/dL — ABNORMAL HIGH (ref <130)
Total CHOL/HDL Ratio: 3.3 (calc) (ref <5.0)
Triglycerides: 77 mg/dL (ref <150)

## 2024-07-09 NOTE — Progress Notes (Signed)
 LDL remains elevated but has improved-122.  Total cholesterol improved and HDL improved.   CBC and CMP WNL

## 2024-07-22 ENCOUNTER — Ambulatory Visit: Admitting: Obstetrics & Gynecology

## 2024-08-28 ENCOUNTER — Ambulatory Visit: Admitting: Obstetrics and Gynecology

## 2024-08-28 ENCOUNTER — Encounter: Payer: Self-pay | Admitting: Obstetrics and Gynecology

## 2024-08-28 VITALS — BP 118/79 | HR 105 | Ht 65.5 in | Wt 113.0 lb

## 2024-08-28 DIAGNOSIS — R6889 Other general symptoms and signs: Secondary | ICD-10-CM

## 2024-08-28 DIAGNOSIS — R4189 Other symptoms and signs involving cognitive functions and awareness: Secondary | ICD-10-CM

## 2024-08-28 DIAGNOSIS — Z01419 Encounter for gynecological examination (general) (routine) without abnormal findings: Secondary | ICD-10-CM

## 2024-08-28 DIAGNOSIS — R5383 Other fatigue: Secondary | ICD-10-CM

## 2024-08-28 DIAGNOSIS — R6882 Decreased libido: Secondary | ICD-10-CM

## 2024-08-28 NOTE — Progress Notes (Signed)
 "  ANNUAL EXAM Patient name: Robin [name] MRN [id number]  Date of birth: 08/18/1985 Chief Complaint:   No chief complaint on file.  History of Present Illness:   Robin [name] is a 39 y.o. [phone] with No LMP recorded. Patient has had an ablation. being seen today for a routine annual exam.  Current complaints:   Low sex drive - progressively worsening and it is starting to affect her marriage. No dyspareunia, sex is still pleasurable but she does not have desire to initiate. She also notes brain fog, emotional lability, increased sleep requirements and temperature dysregulation. Has started adderall for ADHD and is working with therapist & PCP on mental health. She would like hormonal testing as well as testing for micronutrient deficiencies.    Upstream - 08/28/24 1800       Pregnancy Intention Screening   Does the patient want to become pregnant in the next year? No    Does the patient's partner want to become pregnant in the next year? No    Would the patient like to discuss contraceptive options today? No      Contraception Wrap Up   Current Method Vasectomy    End Method Vasectomy    Contraception Counseling Provided No    How was the end contraceptive method provided? N/A         The pregnancy intention screening data noted above was reviewed. Potential methods of contraception were discussed. The patient elected to proceed with Vasectomy.   Last pap 03/17/22. Results were: NILM w/ HRHPV negative. H/O abnormal pap: no Last mammogram: 06/24/22. Results were: BI-RADS 1. Family h/o breast cancer: no Last colonoscopy: n/a. Family h/o colorectal cancer: no HPV vaccine: reports completing in childhood     08/28/2024    2:12 PM 11/10/2016    8:57 AM  Depression screen PHQ 2/9  Decreased Interest 0   Down, Depressed, Hopeless 0   PHQ - 2 Score 0   Altered sleeping 2   Tired, decreased energy 1   Change in appetite 2   Feeling bad or failure about yourself  0   Trouble  concentrating 0   Moving slowly or fidgety/restless 0   Suicidal thoughts 0   PHQ-9 Score 5      Information is confidential and restricted. Go to Review Flowsheets to unlock data.        08/28/2024    2:12 PM 11/10/2016    8:58 AM  GAD 7 : Generalized Anxiety Score  Nervous, Anxious, on Edge 0   Control/stop worrying 0   Worry too much - different things 0   Trouble relaxing 0   Restless 0   Easily annoyed or irritable 1   Afraid - awful might happen 0   Total GAD 7 Score 1   Anxiety Difficulty       Information is confidential and restricted. Go to Review Flowsheets to unlock data.     Review of Systems:   Pertinent items are noted in HPI Denies any headaches, blurred vision, fatigue, shortness of breath, chest pain, abdominal pain, abnormal vaginal discharge/itching/odor/irritation, problems with periods, bowel movements, urination, or intercourse unless otherwise stated above. Pertinent History Reviewed:  Reviewed past medical,surgical, social and family history.  Reviewed problem list, medications and allergies. Physical Assessment:   Vitals:   08/28/24 1329  BP: 118/79  Pulse: (!) 105  Weight: 113 lb (51.3 kg)  Height: 5' 5.5 (1.664 m)  Body mass index is 18.52 kg/m.  Physical Examination:   General appearance - well appearing, and in no distress  Mental status - alert, oriented to person, place, and time  Chest - respiratory effort normal  Heart - normal peripheral perfusion  Breasts - offered, deferred  Pelvic - offered, deferred  No results found for this or any previous visit (from the past 24 hours).  Assessment & Plan:  1) Well-Woman Exam Mammogram: @ 39yo - has hx augmentation w/ silicone and had reported need for breast MRI q18yr. She is not sure where this recommendation came from, so she will touch base with her surgeons to clarify. Reviewed standard screening would start at 39yo and continue annually. Colonoscopy: @ 39yo, or sooner if  problems Pap: Due 2028 Gardasil: Completed per pt report GC/CT: UTD HIV/HCV: UTD Declines flu/tdap today  Low libido Other fatigue Brain fog Temperature intolerance - Broad ddx. Discussed physical, emotional/psychological & relational factors that affect libido and that this is a problem I see very commonly in my practice.  - Med list reviewed, no clear contributors - Discussed working with sex-specific therapist. AASECT information given. - Reviewed limitations of hormone testing including variability during menopause transition and that we don't treat to a certain number. She has not done well w/ OCPs in the past so I wouldn't recommend starting these to treat her symptoms.  - Discussed flibaserin -- unfortunately it would cost just shy of $2k/month with her current insurance provider -     CBC with Differential/Platelet -     TSH Rfx on Abnormal to Free T4; Future -     FSH -     Estradiol -     VITAMIN D 25 Hydroxy (Vit-D Deficiency, Fractures) -     Iron, TIBC and Ferritin Panel -     Vitamin B12 -     CBC -     Iron, TIBC and Ferritin Panel -     VITAMIN D 25 Hydroxy (Vit-D Deficiency, Fractures)  Labs/procedures today:   Orders Placed This Encounter  Procedures   HCV Antibody   CBC with Differential/Platelet   TSH Rfx on Abnormal to Free T4   FSH   Estradiol   VITAMIN D 25 Hydroxy (Vit-D Deficiency, Fractures)   Iron, TIBC and Ferritin Panel   Vitamin B12   CBC   Iron, TIBC and Ferritin Panel   VITAMIN D 25 Hydroxy (Vit-D Deficiency, Fractures)   Meds: No orders of the defined types were placed in this encounter.  Follow-up: Return in about 1 year (around 08/28/2025) for annual exam or sooner as needed.  Kieth JAYSON Carolin, MD 08/28/2024 6:00 PM  "

## 2024-08-28 NOTE — Progress Notes (Signed)
 Would like to check hormone levels.

## 2024-08-29 LAB — CBC
Hematocrit: 42.9 % (ref 34.0–46.6)
Hemoglobin: 14.2 g/dL (ref 11.1–15.9)
MCH: 32 pg (ref 26.6–33.0)
MCHC: 33.1 g/dL (ref 31.5–35.7)
MCV: 97 fL (ref 79–97)
Platelets: 285 10*3/uL (ref 150–450)
RBC: 4.44 x10E6/uL (ref 3.77–5.28)
RDW: 11.4 % — ABNORMAL LOW (ref 11.7–15.4)
WBC: 7 10*3/uL (ref 3.4–10.8)

## 2024-08-29 LAB — IRON,TIBC AND FERRITIN PANEL
Ferritin: 73 ng/mL (ref 15–150)
Iron Saturation: 37 % (ref 15–55)
Iron: 133 ug/dL (ref 27–159)
Total Iron Binding Capacity: 360 ug/dL (ref 250–450)
UIBC: 227 ug/dL (ref 131–425)

## 2024-08-29 LAB — ESTRADIOL: Estradiol: 60.5 pg/mL

## 2024-08-29 LAB — VITAMIN D 25 HYDROXY (VIT D DEFICIENCY, FRACTURES): Vit D, 25-Hydroxy: 30.3 ng/mL (ref 30.0–100.0)

## 2024-08-29 LAB — VITAMIN B12: Vitamin B-12: 549 pg/mL (ref 232–1245)

## 2024-08-29 LAB — FOLLICLE STIMULATING HORMONE: FSH: 8.6 m[IU]/mL

## 2024-08-30 ENCOUNTER — Ambulatory Visit: Payer: Self-pay | Admitting: Obstetrics and Gynecology

## 2024-12-09 ENCOUNTER — Ambulatory Visit: Admitting: Physician Assistant
